# Patient Record
Sex: Female | Born: 1980 | Race: White | Hispanic: No | Marital: Single | Smoking: Never smoker
Health system: Southern US, Community
[De-identification: ages and names within clinical notes are randomized; demographics above are authoritative.]

## PROBLEM LIST (undated history)

## (undated) DIAGNOSIS — R011 Cardiac murmur, unspecified: Secondary | ICD-10-CM

## (undated) DIAGNOSIS — K8020 Calculus of gallbladder without cholecystitis without obstruction: Secondary | ICD-10-CM

## (undated) DIAGNOSIS — K812 Acute cholecystitis with chronic cholecystitis: Secondary | ICD-10-CM

## (undated) DIAGNOSIS — R51 Headache: Secondary | ICD-10-CM

## (undated) DIAGNOSIS — C801 Malignant (primary) neoplasm, unspecified: Secondary | ICD-10-CM

## (undated) DIAGNOSIS — F32A Depression, unspecified: Secondary | ICD-10-CM

## (undated) DIAGNOSIS — F329 Major depressive disorder, single episode, unspecified: Secondary | ICD-10-CM

## (undated) DIAGNOSIS — F419 Anxiety disorder, unspecified: Secondary | ICD-10-CM

## (undated) HISTORY — DX: Major depressive disorder, single episode, unspecified: F32.9

## (undated) HISTORY — PX: COLON SURGERY: SHX602

## (undated) HISTORY — PX: APPENDECTOMY: SHX54

## (undated) HISTORY — DX: Cardiac murmur, unspecified: R01.1

## (undated) HISTORY — DX: Anxiety disorder, unspecified: F41.9

## (undated) HISTORY — DX: Depression, unspecified: F32.A

---
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
## 2000-09-22 ENCOUNTER — Encounter: Payer: Self-pay | Admitting: Emergency Medicine

## 2000-09-22 ENCOUNTER — Emergency Department (HOSPITAL_COMMUNITY): Admission: EM | Admit: 2000-09-22 | Discharge: 2000-09-22 | Payer: Self-pay | Admitting: Emergency Medicine

## 2000-11-15 ENCOUNTER — Other Ambulatory Visit: Admission: RE | Admit: 2000-11-15 | Discharge: 2000-11-15 | Payer: Self-pay | Admitting: Obstetrics and Gynecology

## 2000-12-18 ENCOUNTER — Encounter: Payer: Self-pay | Admitting: Obstetrics and Gynecology

## 2000-12-18 ENCOUNTER — Ambulatory Visit (HOSPITAL_COMMUNITY): Admission: RE | Admit: 2000-12-18 | Discharge: 2000-12-18 | Payer: Self-pay | Admitting: Obstetrics and Gynecology

## 2001-01-24 ENCOUNTER — Inpatient Hospital Stay (HOSPITAL_COMMUNITY): Admission: AD | Admit: 2001-01-24 | Discharge: 2001-01-24 | Payer: Self-pay | Admitting: Obstetrics and Gynecology

## 2001-02-20 ENCOUNTER — Inpatient Hospital Stay (HOSPITAL_COMMUNITY): Admission: AD | Admit: 2001-02-20 | Discharge: 2001-02-20 | Payer: Self-pay | Admitting: Obstetrics and Gynecology

## 2001-02-24 ENCOUNTER — Inpatient Hospital Stay (HOSPITAL_COMMUNITY): Admission: AD | Admit: 2001-02-24 | Discharge: 2001-02-24 | Payer: Self-pay | Admitting: Obstetrics and Gynecology

## 2001-04-29 ENCOUNTER — Inpatient Hospital Stay (HOSPITAL_COMMUNITY): Admission: AD | Admit: 2001-04-29 | Discharge: 2001-04-29 | Payer: Self-pay | Admitting: Obstetrics and Gynecology

## 2001-05-03 ENCOUNTER — Inpatient Hospital Stay (HOSPITAL_COMMUNITY): Admission: AD | Admit: 2001-05-03 | Discharge: 2001-05-03 | Payer: Self-pay | Admitting: Obstetrics and Gynecology

## 2001-05-09 ENCOUNTER — Inpatient Hospital Stay (HOSPITAL_COMMUNITY): Admission: AD | Admit: 2001-05-09 | Discharge: 2001-05-11 | Payer: Self-pay | Admitting: Obstetrics and Gynecology

## 2004-02-29 ENCOUNTER — Emergency Department (HOSPITAL_COMMUNITY): Admission: EM | Admit: 2004-02-29 | Discharge: 2004-02-29 | Payer: Self-pay | Admitting: Emergency Medicine

## 2004-04-14 ENCOUNTER — Emergency Department (HOSPITAL_COMMUNITY): Admission: EM | Admit: 2004-04-14 | Discharge: 2004-04-14 | Payer: Self-pay | Admitting: Emergency Medicine

## 2004-10-02 ENCOUNTER — Emergency Department (HOSPITAL_COMMUNITY): Admission: EM | Admit: 2004-10-02 | Discharge: 2004-10-03 | Payer: Self-pay | Admitting: Emergency Medicine

## 2004-10-09 ENCOUNTER — Emergency Department (HOSPITAL_COMMUNITY): Admission: EM | Admit: 2004-10-09 | Discharge: 2004-10-09 | Payer: Self-pay | Admitting: Emergency Medicine

## 2004-10-13 ENCOUNTER — Encounter: Admission: RE | Admit: 2004-10-13 | Discharge: 2005-01-11 | Payer: Self-pay | Admitting: Orthopedic Surgery

## 2005-08-02 ENCOUNTER — Ambulatory Visit: Payer: Self-pay | Admitting: Family Medicine

## 2005-10-04 ENCOUNTER — Ambulatory Visit: Payer: Self-pay | Admitting: Family Medicine

## 2006-04-25 ENCOUNTER — Emergency Department (HOSPITAL_COMMUNITY): Admission: EM | Admit: 2006-04-25 | Discharge: 2006-04-25 | Payer: Self-pay | Admitting: Emergency Medicine

## 2006-06-06 ENCOUNTER — Ambulatory Visit (HOSPITAL_COMMUNITY): Admission: RE | Admit: 2006-06-06 | Discharge: 2006-06-06 | Payer: Self-pay | Admitting: Cardiovascular Disease

## 2007-01-20 DIAGNOSIS — J45909 Unspecified asthma, uncomplicated: Secondary | ICD-10-CM | POA: Insufficient documentation

## 2007-01-20 DIAGNOSIS — M545 Low back pain: Secondary | ICD-10-CM

## 2007-02-27 ENCOUNTER — Ambulatory Visit: Payer: Self-pay | Admitting: Family Medicine

## 2007-02-28 DIAGNOSIS — G43909 Migraine, unspecified, not intractable, without status migrainosus: Secondary | ICD-10-CM | POA: Insufficient documentation

## 2007-02-28 DIAGNOSIS — F39 Unspecified mood [affective] disorder: Secondary | ICD-10-CM | POA: Insufficient documentation

## 2007-03-06 ENCOUNTER — Telehealth (INDEPENDENT_AMBULATORY_CARE_PROVIDER_SITE_OTHER): Payer: Self-pay | Admitting: *Deleted

## 2007-08-08 ENCOUNTER — Ambulatory Visit: Payer: Self-pay | Admitting: Family Medicine

## 2007-08-08 ENCOUNTER — Other Ambulatory Visit: Admission: RE | Admit: 2007-08-08 | Discharge: 2007-08-08 | Payer: Self-pay | Admitting: Family Medicine

## 2007-08-08 ENCOUNTER — Encounter: Payer: Self-pay | Admitting: Family Medicine

## 2007-08-08 DIAGNOSIS — Z6841 Body Mass Index (BMI) 40.0 and over, adult: Secondary | ICD-10-CM

## 2007-08-08 DIAGNOSIS — R030 Elevated blood-pressure reading, without diagnosis of hypertension: Secondary | ICD-10-CM | POA: Insufficient documentation

## 2007-08-08 LAB — CONVERTED CEMR LAB
ALT: 15 units/L (ref 0–35)
AST: 16 units/L (ref 0–37)
Albumin: 3.3 g/dL — ABNORMAL LOW (ref 3.5–5.2)
Alkaline Phosphatase: 37 units/L — ABNORMAL LOW (ref 39–117)
BUN: 8 mg/dL (ref 6–23)
Basophils Absolute: 0 10*3/uL (ref 0.0–0.1)
Basophils Relative: 0.3 % (ref 0.0–1.0)
Bilirubin Urine: NEGATIVE
Bilirubin, Direct: 0.1 mg/dL (ref 0.0–0.3)
CO2: 30 meq/L (ref 19–32)
Calcium: 9.4 mg/dL (ref 8.4–10.5)
Chloride: 108 meq/L (ref 96–112)
Cholesterol: 193 mg/dL (ref 0–200)
Creatinine, Ser: 0.7 mg/dL (ref 0.4–1.2)
Eosinophils Absolute: 0 10*3/uL (ref 0.0–0.6)
Eosinophils Relative: 0.6 % (ref 0.0–5.0)
GFR calc Af Amer: 130 mL/min
GFR calc non Af Amer: 108 mL/min
Glucose, Bld: 83 mg/dL (ref 70–99)
Glucose, Urine, Semiquant: NEGATIVE
HCT: 36.7 % (ref 36.0–46.0)
HDL: 47.5 mg/dL (ref >39.0)
Hemoglobin: 12.3 g/dL (ref 12.0–15.0)
Hgb A1c MFr Bld: 5.5 % (ref 4.6–6.0)
LDL Cholesterol: 119 mg/dL — ABNORMAL HIGH (ref 0–99)
Lymphocytes Relative: 25.9 % (ref 12.0–46.0)
MCHC: 33.4 g/dL (ref 30.0–36.0)
MCV: 91.3 fL (ref 78.0–100.0)
Monocytes Absolute: 0.3 10*3/uL (ref 0.2–0.7)
Monocytes Relative: 4.2 % (ref 3.0–11.0)
Neutro Abs: 5.3 10*3/uL (ref 1.4–7.7)
Neutrophils Relative %: 69 % (ref 43.0–77.0)
Nitrite: NEGATIVE
Platelets: 234 10*3/uL (ref 150–400)
Potassium: 4.9 meq/L (ref 3.5–5.1)
RBC: 4.02 M/uL (ref 3.87–5.11)
RDW: 12.2 % (ref 11.5–14.6)
Sodium: 142 meq/L (ref 135–145)
Specific Gravity, Urine: >=1.03
TSH: 2.64 microintl units/mL (ref 0.35–5.50)
Total Bilirubin: 0.6 mg/dL (ref 0.3–1.2)
Total CHOL/HDL Ratio: 4.1
Total Protein: 6.2 g/dL (ref 6.0–8.3)
Triglycerides: 131 mg/dL (ref 0–149)
Urobilinogen, UA: 0.2
VLDL: 26 mg/dL (ref 0–40)
WBC: 7.6 10*3/uL (ref 4.5–10.5)
pH: 5.5

## 2010-04-20 ENCOUNTER — Ambulatory Visit: Payer: Self-pay | Admitting: Family Medicine

## 2010-04-20 DIAGNOSIS — J309 Allergic rhinitis, unspecified: Secondary | ICD-10-CM | POA: Insufficient documentation

## 2010-04-20 LAB — CONVERTED CEMR LAB
ALT: 16 units/L (ref 0–35)
AST: 16 units/L (ref 0–37)
Albumin: 3.9 g/dL (ref 3.5–5.2)
Alkaline Phosphatase: 47 units/L (ref 39–117)
BUN: 14 mg/dL (ref 6–23)
Basophils Absolute: 0 10*3/uL (ref 0.0–0.1)
Basophils Relative: 0.1 % (ref 0.0–3.0)
Bilirubin, Direct: 0.1 mg/dL (ref 0.0–0.3)
CO2: 28 meq/L (ref 19–32)
Calcium: 9 mg/dL (ref 8.4–10.5)
Chloride: 106 meq/L (ref 96–112)
Cholesterol: 172 mg/dL (ref 0–200)
Creatinine, Ser: 0.6 mg/dL (ref 0.4–1.2)
Eosinophils Absolute: 0.1 10*3/uL (ref 0.0–0.7)
Eosinophils Relative: 1 % (ref 0.0–5.0)
GFR calc non Af Amer: 127.81 mL/min (ref >60)
Glucose, Bld: 83 mg/dL (ref 70–99)
HCT: 37 % (ref 36.0–46.0)
HDL: 41.6 mg/dL (ref >39.00)
Hemoglobin: 12.7 g/dL (ref 12.0–15.0)
LDL Cholesterol: 105 mg/dL — ABNORMAL HIGH (ref 0–99)
Lymphocytes Relative: 15.7 % (ref 12.0–46.0)
Lymphs Abs: 1.5 10*3/uL (ref 0.7–4.0)
MCHC: 34.4 g/dL (ref 30.0–36.0)
MCV: 92.9 fL (ref 78.0–100.0)
Monocytes Absolute: 0.5 10*3/uL (ref 0.1–1.0)
Monocytes Relative: 4.8 % (ref 3.0–12.0)
Neutro Abs: 7.7 10*3/uL (ref 1.4–7.7)
Neutrophils Relative %: 78.4 % — ABNORMAL HIGH (ref 43.0–77.0)
Platelets: 209 10*3/uL (ref 150.0–400.0)
Potassium: 4.2 meq/L (ref 3.5–5.1)
RBC: 3.98 M/uL (ref 3.87–5.11)
RDW: 13.5 % (ref 11.5–14.6)
Sodium: 142 meq/L (ref 135–145)
TSH: 1 microintl units/mL (ref 0.35–5.50)
Total Bilirubin: 0.6 mg/dL (ref 0.3–1.2)
Total CHOL/HDL Ratio: 4
Total Protein: 6.8 g/dL (ref 6.0–8.3)
Triglycerides: 128 mg/dL (ref 0.0–149.0)
VLDL: 25.6 mg/dL (ref 0.0–40.0)
WBC: 9.8 10*3/uL (ref 4.5–10.5)

## 2010-05-22 ENCOUNTER — Other Ambulatory Visit
Admission: RE | Admit: 2010-05-22 | Discharge: 2010-05-22 | Payer: Self-pay | Source: Home / Self Care | Admitting: Family Medicine

## 2010-05-22 ENCOUNTER — Ambulatory Visit: Payer: Self-pay | Admitting: Family Medicine

## 2010-07-04 NOTE — Assessment & Plan Note (Signed)
Summary: sinuses//ok per rachel//ccm   Vital Signs:  Patient profile:   30 year old female Weight:      266 pounds Temp:     98.0 degrees F oral BP sitting:   110 / 80  (left arm) Cuff size:   regular  Vitals Entered By: Kern Reap CMA Duncan Dull) (April 20, 2010 3:07 PM) CC: head congestion   CC:  head congestion.  History of Present Illness: [name] [name] is a 30 year old female, who comes in today with a 3-D 8 history of head congestion, postnasal drip, and sneezing.  No viral symptoms.  No history of previous allergic problems in the past  Allergies: No Known Drug Allergies  Past History:  Past medical, surgical, family and social histories (including risk factors) reviewed for relevance to current acute and chronic problems.  Past Medical History: Reviewed history from 08/08/2007 and no changes required. Asthma Acne Heart Murmur Low back pain obese  Past Surgical History: Reviewed history from 01/20/2007 and no changes required. Denies surgical history  Family History: Reviewed history from 01/20/2007 and no changes required. Family History of ADHD Family History Breast cancer 1st degree relative <50 Family History Psychiatric care Family History Weight disorder Family History of Cardiovascular disorder Family History Hypertension Family History of [place]  Social History: Reviewed history from 08/08/2007 and no changes required. 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 No apparent alopecia or balding. Eyes:  No corneal or conjunctival inflammation noted. EOMI. Perrla. Funduscopic exam benign, without hemorrhages, exudates or papilledema. Vision grossly normal. Ears:  External ear  exam shows no significant lesions or deformities.  Otoscopic examination reveals clear canals, tympanic membranes are intact bilaterally without bulging, retraction, inflammation or discharge. Hearing is grossly normal bilaterally. Nose:  External nasal examination shows no deformity or inflammation. Nasal mucosa are pink and moist without lesions or exudates. Mouth:  Oral mucosa and oropharynx without lesions or exudates.  Teeth in good repair. Neck:  No deformities, masses, or tenderness noted. Chest Wall:  No deformities, masses, or tenderness noted. Lungs:  Normal respiratory effort, chest expands symmetrically. Lungs are clear to auscultation, no crackles or wheezes.   Problems:  Medical Problems Added: 1)  Dx of Routine General Medical Exam@[facility]  (ICD-V70.0) 2)  Dx of Rhinitis  (ICD-477.9)  Impression & Recommendations:  Problem # 1:  RHINITIS (ICD-477.9) Assessment New  Her updated medication list for this problem includes:    Flonase 50 Mcg/act Susp (Fluticasone propionate) ..... Uad  Complete Medication List: 1)  Celexa 20 Mg Tabs (Citalopram hydrobromide) .... Once daily 2)  Zomig Zmt 5 Mg Tbdp (Zolmitriptan) .... As needed ha 3)  Flonase 50 Mcg/act Susp (Fluticasone propionate) .... Uad  Other Orders: Venipuncture (48546) TLB-Lipid Panel (80061-LIPID) TLB-BMP (Basic Metabolic Panel-BMET) (80048-METABOL) TLB-CBC Platelet - w/Differential (85025-CBCD) TLB-Hepatic/Liver Function Pnl (80076-HEPATIC) TLB-TSH (Thyroid Stimulating Hormone) (84443-TSH) UA Dipstick w/o Micro (automated)  (81003) Specimen Handling (27035)  Patient Instructions: 1)  take plain Zyrtec 10 mg at bedtime, along with the nasal irrigation................Marland Kitchen Afrin.......... warm salt water........ Flonase nasal spray............ however remember.  There is a 5 night limit on the afrin  2)  Return sometime in the next month for general physical exam Prescriptions: ZOMIG ZMT 5 MG  TBDP  (ZOLMITRIPTAN) as needed ha  #6 x 0  Entered and Authorized by:   Roderick Pee MD   Signed by:   Roderick Pee MD on 04/20/2010   Method used:   Print then Give to Patient   RxID:   [id number] CELEXA 20 MG  TABS (CITALOPRAM HYDROBROMIDE) once daily  #100 Tablet x 0   Entered and Authorized by:   Roderick Pee MD   Signed by:   Roderick Pee MD on 04/20/2010   Method used:   Print then Give to Patient   RxID:   [id number] QIONGEX 50 MCG/ACT SUSP (FLUTICASONE PROPIONATE) UAD  #1 unit x 6   Entered and Authorized by:   Roderick Pee MD   Signed by:   Roderick Pee MD on 04/20/2010   Method used:   Print then Give to Patient   RxID:   [id number]    Orders Added: 1)  Venipuncture [36644] 2)  TLB-Lipid Panel [80061-LIPID] 3)  TLB-BMP (Basic Metabolic Panel-BMET) [80048-METABOL] 4)  TLB-CBC Platelet - w/Differential [85025-CBCD] 5)  TLB-Hepatic/Liver Function Pnl [80076-HEPATIC] 6)  TLB-TSH (Thyroid Stimulating Hormone) [84443-TSH] 7)  Est. Patient Level III [03474] 8)  UA Dipstick w/o Micro (automated)  [81003] 9)  Specimen Handling [99000]  Appended Document: sinuses//ok per rachel//ccm The is an Laboratory Results   Urine Tests    Routine Urinalysis   Color: yellow Appearance: Clear Glucose: negative   (Normal Range: Negative) Bilirubin: negative   (Normal Range: Negative) Ketone: trace (5)   (Normal Range: Negative) Spec. Gravity: >=1.030   (Normal Range: 1.003-1.035) Blood: 2+   (Normal Range: Negative) pH: 5.5   (Normal Range: 5.0-8.0) Protein: trace   (Normal Range: Negative) Urobilinogen: 1.0   (Normal Range: 0-1) Nitrite: positive   (Normal Range: Negative) Leukocyte Esterace: 1+   (Normal Range: Negative)    Comments: Rita Ohara  April 20, 2010 4:38 PM

## 2010-07-06 NOTE — Assessment & Plan Note (Signed)
Summary: cpx//pap//alp   Vital Signs:  Patient profile:   30 year old female Menstrual status:  regular LMP:     05/09/2010 Height:      65 inches Weight:      261 pounds BMI:     43.59 Temp:     98.3 degrees F oral BP sitting:   100 / 76  (left arm) Cuff size:   regular  Vitals Entered By: Kern Reap CMA Duncan Dull) (May 22, 2010 10:08 AM) CC: cpx Is Patient Diabetic? No Pain Assessment Patient in pain? no      LMP (date): 05/09/2010     Menstrual Status regular Enter LMP: 05/09/2010   CC:  cpx.  History of Present Illness: [name] [name] is a delightful 30 year old. female, G2, P2....... children are 77 and 63 years of age......... impending divorce........ husband refuses marriage counseling........ who comes in today for general medical examination.  She takes Celexa 20 mg nightly for depression, and Zomig p.r.n. for migraines.  She states she feels otherwise well has begun to an exercise program has lost 30 pounds in last 12 months.  She's down to 261 pounds.  Menses are normal.  Tetanus 2009 no birth control not sexually active  Allergies (verified): No Known Drug Allergies  Past History:  Past medical, surgical, family and social histories (including risk factors) reviewed, and no changes noted (except as noted below).  Past Medical History: Reviewed history from 08/08/2007 and no changes required. Asthma Acne Heart Murmur Low back pain obese  Past Surgical History: Reviewed history from 01/20/2007 and no changes required. Denies surgical history  Family History: Reviewed history from 01/20/2007 and no changes required. Family History of ADHD Family History Breast cancer 1st degree relative <50 Family History Psychiatric care Family History Weight disorder Family History of Cardiovascular disorder Family History Hypertension Family History of [place]  Social History: Reviewed history from 08/08/2007 and no changes  required. 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 No apparent alopecia or balding. Eyes:  No corneal or conjunctival inflammation noted. EOMI. Perrla. Funduscopic exam benign, without hemorrhages, exudates or papilledema. Vision grossly normal. Ears:  External ear exam shows no significant lesions or deformities.  Otoscopic examination reveals clear canals, tympanic membranes are intact bilaterally without bulging, retraction, inflammation or discharge. Hearing is grossly normal bilaterally. Nose:  External nasal examination shows no deformity or inflammation. Nasal mucosa are pink and moist without lesions or exudates. Mouth:  Oral mucosa and oropharynx without lesions or exudates.  Teeth in good repair. Neck:  No deformities, masses, or tenderness noted. Chest Wall:  No deformities, masses, or tenderness noted. Breasts:  No mass, nodules, thickening, tenderness, bulging, retraction, inflamation, nipple discharge or skin changes noted.   Lungs:  Normal respiratory effort, chest expands symmetrically. Lungs are clear to auscultation, no crackles or wheezes. Heart:  Normal rate and regular rhythm. S1 and S2 normal without gallop, murmur, click, rub or other extra sounds. Abdomen:  Bowel sounds positive,abdomen soft and non-tender without masses, organomegaly or hernias noted. Genitalia:  Pelvic Exam:        External: normal female genitalia without lesions or masses        Vagina: normal without lesions or masses        Cervix: normal without lesions or masses  Adnexa: normal bimanual exam without masses or fullness        Uterus: normal by palpation        Pap smear: performed Msk:  No deformity or scoliosis  noted of thoracic or lumbar spine.   Pulses:  R and L carotid,radial,femoral,dorsalis pedis and posterior tibial pulses are full and equal bilaterally Extremities:  No clubbing, cyanosis, edema, or deformity noted with normal full range of motion of all joints.   Neurologic:  No cranial nerve deficits noted. Station and gait are normal. Plantar reflexes are down-going bilaterally. DTRs are symmetrical throughout. Sensory, motor and coordinative functions appear intact. Skin:  Intact without suspicious lesions or rashes Cervical Nodes:  No lymphadenopathy noted Axillary Nodes:  No palpable lymphadenopathy Inguinal Nodes:  No significant adenopathy Psych:  Cognition and judgment appear intact. Alert and cooperative with normal attention span and concentration. No apparent delusions, illusions, hallucinations   Impression & Recommendations:  Problem # 1:  ROUTINE GENERAL MEDICAL EXAM@[facility] (ICD-V70.0) Assessment Unchanged  Problem # 2:  OVERWEIGHT (ICD-278.02) Assessment: Improved  Problem # 3:  MIGRAINE HEADACHE (ICD-346.90) Assessment: Improved  Her updated medication list for this problem includes:    Zomig Zmt 5 Mg Tbdp (Zolmitriptan) .Marland Kitchen... 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 Once daily 2)  Zomig Zmt 5 Mg Tbdp (Zolmitriptan) .... As needed ha 3)  Zovia 1/35e (28) 1-35 Mg-mcg Tabs (Ethynodiol diac-eth estradiol) .... Uad  Other Orders: Prescription Created Electronically [phone])  Patient Instructions: 1)  continue current medications. 2)  Call Victorino Dike and make an arrangement for you and your children to begin family counseling 3)  Please schedule a follow-up appointment in 1 year. 4)  Please schedule a follow-up appointment as needed. Prescriptions: ZOVIA 1/35E (28) 1-35 MG-MCG TABS (ETHYNODIOL DIAC-ETH ESTRADIOL) UAD  #3 x 3    Entered and Authorized by:   Roderick Pee MD   Signed by:   Roderick Pee MD on 05/22/2010   Method used:   Print then Give to Patient   RxID:   [id number] ZOMIG ZMT 5 MG  TBDP (ZOLMITRIPTAN) as needed ha  #6 x 0   Entered and Authorized by:   Roderick Pee MD   Signed by:   Roderick Pee MD on 05/22/2010   Method used:   Print then Give to Patient   RxID:   [id number] CELEXA 20 MG  TABS (CITALOPRAM HYDROBROMIDE) once daily  #100 Tablet x 0   Entered and Authorized by:   Roderick Pee MD   Signed by:   Roderick Pee MD on 05/22/2010   Method used:   Print then Give to Patient   RxID:   [id number]    Orders Added: 1)  Prescription Created Electronically [G8553] 2)  Est. Patient 18-39 years [25366]

## 2010-08-08 ENCOUNTER — Ambulatory Visit (INDEPENDENT_AMBULATORY_CARE_PROVIDER_SITE_OTHER): Payer: Self-pay | Admitting: Internal Medicine

## 2010-08-08 ENCOUNTER — Encounter: Payer: Self-pay | Admitting: Internal Medicine

## 2010-08-08 DIAGNOSIS — M545 Low back pain: Secondary | ICD-10-CM

## 2010-08-08 MED ORDER — DICLOFENAC SODIUM 75 MG PO TBEC: DELAYED_RELEASE_TABLET | ORAL | Status: AC

## 2010-08-08 MED ORDER — CYCLOBENZAPRINE HCL 10 MG PO TABS: 10.0000 mg | ORAL_TABLET | Freq: Three times a day (TID) | ORAL | Status: DC | PRN

## 2010-08-11 ENCOUNTER — Emergency Department (HOSPITAL_COMMUNITY)
Admission: EM | Admit: 2010-08-11 | Discharge: 2010-08-11 | Disposition: A | Discharge status: Home / Self Care | Payer: Self-pay | Attending: Emergency Medicine | Admitting: Emergency Medicine

## 2010-08-11 ENCOUNTER — Emergency Department (HOSPITAL_COMMUNITY): Payer: Self-pay

## 2010-08-11 DIAGNOSIS — Z1810 Retained metal fragments, unspecified: Secondary | ICD-10-CM | POA: Insufficient documentation

## 2010-08-11 DIAGNOSIS — M795 Residual foreign body in soft tissue: Secondary | ICD-10-CM | POA: Insufficient documentation

## 2010-08-11 DIAGNOSIS — M79609 Pain in unspecified limb: Secondary | ICD-10-CM | POA: Insufficient documentation

## 2010-08-14 ENCOUNTER — Encounter: Payer: Self-pay | Admitting: Internal Medicine

## 2010-08-14 NOTE — Progress Notes (Signed)
  Subjective:    Patient ID: Leafy Ro, female    DOB: 04/21/1981, 30 y.o.   MRN: [id number]  HPI Pt presents to clinic for evaluation of back pain. Notes right lbp with radiation down right leg x 1 wk. Denies injury or trigger. Worsens with change in position and improves with standing. Denies numbness or weakness. Attempted otc nsaids. No other complaints.  Reviewed pmh, medications and allergies.    Review of Systems  Musculoskeletal: Positive for back pain. Negative for joint swelling, arthralgias and gait problem.  Skin: Negative for color change, pallor and rash.       Objective:   Physical Exam  Nursing note and vitals reviewed. Constitutional: She appears well-developed and well-nourished. No distress.  HENT:  Head: Normocephalic and atraumatic.  Right Ear: External ear normal.  Left Ear: External ear normal.  Musculoskeletal:       No midline ls tenderness or bony abn. SLR neg right. LE strength 5/5 bilaterally. Gait nl  Neurological: She is alert.  Skin: Skin is warm and dry. She is not diaphoretic. No erythema.          Assessment & Plan:

## 2010-08-14 NOTE — Assessment & Plan Note (Signed)
Begin diclofenac with food and no other nsaids. Begin flexeril prn. Cautioned re: possible sedating effect. Followup if no improvement or worsening.

## 2010-10-20 NOTE — Discharge Summary (Signed)
[facility]  Patient:    [name] [name], [name] [name] Visit Number: [id number] MRN: [id number]          Service Type: OBS Location: 910A [id number] Attending Physician:  Michaele Offer Dictated by:   Zenaida Niece, M.D. Admit Date:  05/09/2001 Discharge Date: 05/11/2001                             Discharge Summary  ADMISSION DIAGNOSES: 1. Intrauterine pregnancy at 38 weeks. 2. Group B strep carrier. 3. Pulmonary stenosis.  DISCHARGE DIAGNOSES: 1. Intrauterine pregnancy at 38 weeks. 2. Group B strep carrier. 3. Pulmonary stenosis.  PROCEDURE:  Spontaneous vaginal delivery.  COMPLICATIONS:  None.  CONSULTATIONS:  None.  HISTORY OF PRESENT ILLNESS:  This is a 30 year old white female, gravida 2, para 1-0-0-1, with an estimated gestational age of [redacted] weeks by a 9 week ultrasound with a due date of May 21, 2001, who presents with the complain of regular contractions without bleeding or rupture of membranes, and with good fetal movement.  Vaginal examination in the office was 5 cm dilated, and she was sent to labor and delivery.  Prenatal care complicated only by anemia treated with iron.  PRENATAL LABORATORY DATA:  Blood type is A-, negative antibody screen.  RPR nonreactive.  Rubella immune.  Hepatitis B surface antigen negative.  HIV negative.  Gonorrhea and chlamydia negative.  Triple screen normal.  One hour glucola 131.  Group B strep is positive.  PAST OBSTETRICAL HISTORY:  In April 2001, vaginal delivery at 38 weeks, 8 pounds 4 ounces, no complications.  PAST MEDICAL HISTORY: 1. She has moderate pulmonary stenosis and gets SBE prophylaxis. 2. Mild asthma. 3. Occasional migraines. 4. History of a fractured left fifth digit.  MEDICATIONS: 1. Iron. 2. Proventil metered dose inhaler p.r.n.  SOCIAL HISTORY:  She is single, and father of the baby is here.  PHYSICAL EXAMINATION:  VITAL SIGNS:  She is afebrile with stable  vital signs.  Fetal heart tracing is reactive.  ABDOMEN:  Gravid, nontender, with an appropriate fundal height.  PELVIC:  My first vaginal examination on labor and delivery is 5, 50, and -1.  HOSPITAL COURSE:  The patient was admitted in labor and continue to contract on her own.  She had spontaneous rupture of membranes and received an epidural.  She was put on penicillin for Group B strep prophylaxis.  It was also planned to give her gentamicin around delivery for SBE prophylaxis. However, she progressed rapidly to complete, and pushed well.  She had a vaginal delivery of a viable female infant with Apgars of 9 and 9 that weighed 6 pounds 12 ounces over an intact perineum.  Placenta delivered spontaneous and was intact.  She had a first degree vaginal and right labial lacerations repaired with 3-0 Vicryl.  Estimated blood loss was less than 500 cc.  Due to the fact that she had progressed too quickly to get gentamicin, she was given one dose of 2 g of amoxicillin.  Postpartum, she did very well.  Remained afebrile, and bottle fed her baby without complications.  Pre-delivery hemoglobin 10.2, post-delivery 9.9.  On the morning of postpartum day #2, she was felt to be stable enough for discharge home.  CONDITION ON DISCHARGE:  Stable.  DISPOSITION:  Discharged to home.  DIET:  Regular.  ACTIVITY:  Pelvic rest.  FOLLOWUP:  In 4 to 6 weeks.  DISCHARGE MEDICATIONS:  Over-the-counter Motrin  or Aleve p.r.n.  She is given our discharge pamphlet. Dictated by:   Zenaida Niece, M.D. Attending Physician:  Michaele Offer DD:  05/11/01 TD:  05/11/01 Job: [id number]

## 2010-10-20 NOTE — Discharge Summary (Signed)
[facility]  Patient:    [name] [name], [name] [name]                 MRN: [id number] Adm. Date:  [id number] Disc. Date: [id number] Attending:  Michaele Offer                           Discharge Summary  HISTORY OF PRESENT ILLNESS:   [age]-year-old white female, para 0, gravida 1, estimated gestational age 30+ weeks by last period, compatible with a 17-week ultrasound with [facility] September 25, 1999, presented complaining of regular contractions, no uterine bleeding or rupture of membranes.  Good fetal movement.  Prenatal course complicated by Rh negative.  She did receive RhoGAM.  Positive group B strep. Blood group and type A negative with a negative antibody.  RPR nonreactive. Rubella immune.  Hepatitis B surface antigen negative.  GC and chlamydia negative. Pap smear normal.  One-hour Glucola 151, three-hour GTT 90, 93, 91, and 101. Group B strep positive.  PAST MEDICAL HISTORY:         1. History of heart murmur, asymptomatic.  The                                  patient has a card from 1993, stating she has                                  trivial valvular pulmonic stenosis and needs SBE                                  prophylaxis.                               2. Asthma.  Rare use of Proventil metered dose                                  inhaler.                               3. Left fifth finger fracture, age 30.  PAST SURGICAL HISTORY:        Negative.  ALLERGIES:                    No known drug allergies.  MEDICATIONS:                  Proventil MDI p.r.n.  SOCIAL HISTORY:               Single.  Father of the baby with the patient.  No  tobacco.  PHYSICAL EXAMINATION:         On admission, the patient had normal vital signs.  Fetal heart tones were reactive without decelerations.  Contractions were every  five to seven minutes.  Abdomen was gravid, nontender.  Fundal height 42 cm. Estimated fetal weight about 7-1/2 pounds.   Cervix was 3 cm, 50% effaced, and ballotable; subsequently became 4 and 60, and then 4 and 70 and -1.  Artificial  rupture of membranes produced a large amount of clear  fluid.  ADMISSION IMPRESSION:         1. Intrauterine pregnancy at 38 weeks, in early                                  labor.                               2. Patient Rh negative, received RhoGAM.                               3. Asymptomatic trivial pulmonic stenosis.                               4. Positive group B strep.  HOSPITAL COURSE:              The patient was prepared for ampicillin and gentamicin around the time of delivery.  She progressed to full dilation and, after 25 minutes of pushing, delivered a living female infant, 8 pounds, 4 ounces, Apgars of 8 at one and 9 at five minutes, over a midline episiotomy under local and epidural by Dr. Ambrose Mantle.  Placenta intact, uterus normal.  Rectal negative. Midline episiotomy repaired with 3-0 Dexon.  Blood loss 500 cc.  Postpartum, the patient did quite well and was discharged on the second postpartum day.  The patient did receive RhoGAM.  Hemoglobin on admission was 11.6, hematocrit 32.8, white count  10,100, platelet count 214,000.  Follow-up hematocrit was 28.8, hemoglobin 10.0, white count 10,700, platelet count 185,000.  RPR was nonreactive.  FINAL DIAGNOSES:              1. Intrauterine pregnancy 38 weeks, delivered vertex.                               2. Group B strep carrier.                               3. Heart murmur.                               4. Patient Rh negative.  OPERATIONS:                   Spontaneous delivery, vertex, midline episiotomy nd repair.  CONDITION ON DISCHARGE:       Improved.  DISCHARGE INSTRUCTIONS:       Include our regular discharge instruction booklet.  MEDICATIONS:                  She declines analgesics.  FOLLOW-UP:                    She is advised to return in six weeks for follow-up examination. DD:   09/20/99 TD:  09/20/99 Job: [id number]

## 2010-11-28 ENCOUNTER — Encounter: Payer: Self-pay | Admitting: Family Medicine

## 2010-11-28 ENCOUNTER — Ambulatory Visit (INDEPENDENT_AMBULATORY_CARE_PROVIDER_SITE_OTHER): Payer: 59 | Admitting: Family Medicine

## 2010-11-28 VITALS — BP 110/80 | Temp 98.7°F | Wt 243.0 lb

## 2010-11-28 DIAGNOSIS — M545 Low back pain, unspecified: Secondary | ICD-10-CM

## 2010-11-28 MED ORDER — CYCLOBENZAPRINE HCL 10 MG PO TABS: ORAL_TABLET | ORAL | Status: DC

## 2010-11-28 MED ORDER — HYDROCODONE-ACETAMINOPHEN 7.5-750 MG PO TABS: ORAL_TABLET | ORAL | Status: DC

## 2010-11-28 NOTE — Patient Instructions (Signed)
Stay at complete bed rest today, Wednesday, and Thursday, and Friday, walk and then lie down and do this Friday, Saturday, Sunday, and see [place] on Monday for follow-up.  Flexeril and Vicodin, one half of each 3 to 4 times daily as needed for severe pain.  Begin a caloric restricted diet.  Decrease her caloric intake to 1500 calories daily and drink 32 ounces of water daily.  We will also have you see the physical therapist ASAP

## 2010-11-28 NOTE — Progress Notes (Signed)
  Subjective:    Patient ID: [name] [name], female    DOB: 10/09/1980, 30 y.o.   MRN: [id number]  HPIJennifer is a delightful 30 year old, married female, nonsmoker,,,,,,,,, works at [employer],,,,,, who comes in today for evaluation of low back pain.  She states that in mid April she developed the gradual onset of right lower back pain and hip pain.  She was seen here in the office by Dr. Irving Burton and at that time.  Neurologic exam was normal.  She was felt to have a pinched nerve in her back.  It was treated with anti-inflammatories, Flexeril, and pain went away felt fine until about 3 weeks ago, when she was lifting at work and developed the sudden onset of severe sharp pain in her right hip that radiates down to the [place] portion of her right leg.  She describes the pain as sharp, constant, shooting down her right leg.  She is an occasional episode of numbness or entire leg.  Will go numb.  No bowel or bladder dysfunction.  Neurologic review of systems otherwise negative.  No history of trauma    Review of Systems General neurologic review of systems negative    Objective:   Physical Exam    Well-developed well-nourished, female, obese, weight 243, examination.  The abdomen is negative.  Lower extremities both legs are of equal length.  Sensation muscle strength.  Reflexes all within normal limits.  Positive straight leg raising right leg 30 degrees    Assessment & Plan:  Lumbar disk disease with radiation down right leg.  Plan bed rest medications follow-up Monday.  Physical therapy diet, exercise, weight loss

## 2010-12-05 ENCOUNTER — Ambulatory Visit (INDEPENDENT_AMBULATORY_CARE_PROVIDER_SITE_OTHER): Payer: 59 | Admitting: Family Medicine

## 2010-12-05 ENCOUNTER — Ambulatory Visit (INDEPENDENT_AMBULATORY_CARE_PROVIDER_SITE_OTHER)
Admission: RE | Admit: 2010-12-05 | Discharge: 2010-12-05 | Disposition: A | Discharge status: Home / Self Care | Payer: 59 | Source: Ambulatory Visit | Attending: Family Medicine | Admitting: Family Medicine

## 2010-12-05 ENCOUNTER — Encounter: Payer: Self-pay | Admitting: Family Medicine

## 2010-12-05 VITALS — BP 110/80 | Temp 99.0°F | Wt 243.0 lb

## 2010-12-05 DIAGNOSIS — M545 Low back pain, unspecified: Secondary | ICD-10-CM
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
## 2010-12-05 NOTE — Patient Instructions (Signed)
Go to the main office now for x-rays of your spine.  I will try to get you set up for the the fifth of the sixth of this month to begin physical therapy.  See me a week after the physical therapy for follow-up.  Stop the Flexeril.  Pain medication one half to one tab 3 to 4 times daily as needed for pain

## 2010-12-07 NOTE — Progress Notes (Signed)
Left message on machine for patient

## 2012-03-17 ENCOUNTER — Telehealth: Payer: Self-pay | Admitting: Family Medicine

## 2012-03-17 NOTE — Telephone Encounter (Signed)
Come in tomorrow Tuesday

## 2012-03-17 NOTE — Telephone Encounter (Signed)
Lab or office visit?

## 2012-03-17 NOTE — Telephone Encounter (Signed)
Appointment made

## 2012-03-17 NOTE — Telephone Encounter (Signed)
Pt is req to come in to get pregnancy test done. Pt said that she took home pregnancy test and results were positive.

## 2012-03-18 ENCOUNTER — Ambulatory Visit (INDEPENDENT_AMBULATORY_CARE_PROVIDER_SITE_OTHER): Payer: 59 | Admitting: Family Medicine

## 2012-03-18 ENCOUNTER — Encounter: Payer: Self-pay | Admitting: Family Medicine

## 2012-03-18 VITALS — BP 110/78 | Temp 98.4°F | Wt 270.0 lb

## 2012-03-18 DIAGNOSIS — N926 Irregular menstruation, unspecified: Secondary | ICD-10-CM

## 2012-03-18 DIAGNOSIS — N912 Amenorrhea, unspecified: Secondary | ICD-10-CM

## 2012-03-18 DIAGNOSIS — Z331 Pregnant state, incidental: Secondary | ICD-10-CM

## 2012-03-18 MED ORDER — HYDROCODONE-ACETAMINOPHEN 7.5-750 MG PO TABS: 1.0000 | ORAL_TABLET | Freq: Three times a day (TID) | ORAL | Status: DC | PRN

## 2012-03-18 NOTE — Progress Notes (Signed)
  Subjective:    Patient ID: [name] [name], female    DOB: [birth year], 31 y.o.   MRN: [id number]  HPI [name] [name] is a 31 year old married female nonsmoker G [id number],,,,,,,, 31 year old and 31 year old at home,,,, who comes in today feeling that she might be pregnant  Her LMP was August 21 through August 26 normal. She skipped this month. She checked a home pregnancy test and it was positive. Pregnancy test here also positive. She has some morning sickness and she's having occasional flare up in her migraine headaches otherwise she feels well. She has started taking her prenatal vitamins with folic acid  She has insurance through [place] [employer] because she works at [employer]. Her husband works at a bowling [employer] and he and the 2 children are not insured.   Review of Systems    general review of systems otherwise negative Objective:   Physical Exam Well-developed well-nourished female no acute distress cardiopulmonary exam normal abdominal exam normal  Pregnancy test urine positive by dates approximately 4 weeks       Assessment & Plan:  Pregnant x4 weeks plan continue the prenatal vitamins with folic acid refer to OB for followup  Migraine headaches,,,,,,,,, Vicodin 1/2-1 tablet when necessary for breakthrough migraines because she cannot take her normal antimigraine medication

## 2012-03-18 NOTE — Patient Instructions (Signed)
Take the prenatal matte vitamins with folic acid  Call and get set up for an OB appointment ASAP  Vicodin ES,,,,,,,, 1/2-1 tablet every 4-6 hours as needed for breakthrough migraine  Return when necessary

## 2012-04-10 ENCOUNTER — Telehealth: Payer: Self-pay | Admitting: *Deleted

## 2012-04-10 NOTE — Telephone Encounter (Signed)
Patient is calling because an Ultrasound is now required for her pregnancy.  She would like to know if we can order one?

## 2012-04-14 NOTE — Telephone Encounter (Signed)
Fleet Contras please call ultrasounds are done in the [facility] office??????????

## 2012-04-16 NOTE — Telephone Encounter (Signed)
Tried to call patient but voice mail has not been set up yet. 

## 2012-06-10 ENCOUNTER — Other Ambulatory Visit: Payer: Self-pay

## 2012-06-27 ENCOUNTER — Encounter: Payer: Self-pay | Admitting: Family Medicine

## 2012-07-02 ENCOUNTER — Other Ambulatory Visit (HOSPITAL_COMMUNITY): Payer: Self-pay | Admitting: Obstetrics and Gynecology

## 2012-07-02 DIAGNOSIS — Z0489 Encounter for examination and observation for other specified reasons: Secondary | ICD-10-CM

## 2012-07-03 ENCOUNTER — Ambulatory Visit (HOSPITAL_COMMUNITY)
Admission: RE | Admit: 2012-07-03 | Discharge: 2012-07-03 | Disposition: A | Discharge status: Home / Self Care | Payer: 59 | Source: Ambulatory Visit | Attending: Obstetrics and Gynecology | Admitting: Obstetrics and Gynecology

## 2012-07-03 ENCOUNTER — Encounter (HOSPITAL_COMMUNITY): Payer: Self-pay

## 2012-07-03 DIAGNOSIS — Z363 Encounter for antenatal screening for malformations: Secondary | ICD-10-CM | POA: Insufficient documentation

## 2012-07-03 DIAGNOSIS — Z0489 Encounter for examination and observation for other specified reasons: Secondary | ICD-10-CM

## 2012-07-03 DIAGNOSIS — Z1389 Encounter for screening for other disorder: Secondary | ICD-10-CM | POA: Insufficient documentation

## 2012-07-03 DIAGNOSIS — O358XX0 Maternal care for other (suspected) fetal abnormality and damage, not applicable or unspecified: Secondary | ICD-10-CM | POA: Insufficient documentation

## 2012-07-03 NOTE — Progress Notes (Signed)
Lise Auer Brislin  was seen today for an ultrasound appointment.  See full report in [facility].  Impression: Single IUP at 21 3/7 weeks Normal detailed fetal anatomy No markers associated with aneuploidy noted Normal amniotic fluid volume  Recommendations: Follow-up ultrasounds as clinically indicated.   Alpha Gula, MD

## 2012-07-10 ENCOUNTER — Other Ambulatory Visit (HOSPITAL_COMMUNITY): Payer: 59

## 2012-07-29 ENCOUNTER — Encounter (HOSPITAL_COMMUNITY): Payer: Self-pay | Admitting: *Deleted

## 2012-07-29 DIAGNOSIS — J45909 Unspecified asthma, uncomplicated: Secondary | ICD-10-CM | POA: Insufficient documentation

## 2012-07-29 DIAGNOSIS — K8020 Calculus of gallbladder without cholecystitis without obstruction: Secondary | ICD-10-CM | POA: Insufficient documentation

## 2012-07-29 DIAGNOSIS — R1013 Epigastric pain: Secondary | ICD-10-CM | POA: Insufficient documentation

## 2012-07-29 DIAGNOSIS — Z79899 Other long term (current) drug therapy: Secondary | ICD-10-CM | POA: Insufficient documentation

## 2012-07-29 DIAGNOSIS — O9989 Other specified diseases and conditions complicating pregnancy, childbirth and the puerperium: Secondary | ICD-10-CM | POA: Insufficient documentation

## 2012-07-29 DIAGNOSIS — O219 Vomiting of pregnancy, unspecified: Secondary | ICD-10-CM | POA: Insufficient documentation

## 2012-07-29 DIAGNOSIS — R011 Cardiac murmur, unspecified: Secondary | ICD-10-CM | POA: Insufficient documentation

## 2012-07-29 LAB — COMPREHENSIVE METABOLIC PANEL
ALT: 10 U/L (ref 0–35)
Albumin: 2.8 g/dL — ABNORMAL LOW (ref 3.5–5.2)
Alkaline Phosphatase: 65 U/L (ref 39–117)
Glucose, Bld: 99 mg/dL (ref 70–99)
Potassium: 4 mEq/L (ref 3.5–5.1)
Sodium: 139 mEq/L (ref 135–145)
Total Protein: 7.1 g/dL (ref 6.0–8.3)

## 2012-07-29 LAB — PREGNANCY, URINE: Preg Test, Ur: POSITIVE — AB

## 2012-07-29 LAB — URINE MICROSCOPIC-ADD ON

## 2012-07-29 LAB — URINALYSIS, ROUTINE W REFLEX MICROSCOPIC
Glucose, UA: NEGATIVE mg/dL
Protein, ur: 30 mg/dL — AB
Specific Gravity, Urine: 1.031 — ABNORMAL HIGH (ref 1.005–1.030)
pH: 5.5 (ref 5.0–8.0)

## 2012-07-29 LAB — CBC WITH DIFFERENTIAL/PLATELET
Basophils Relative: 0 % (ref 0–1)
Hemoglobin: 12.1 g/dL (ref 12.0–15.0)
MCHC: 35.2 g/dL (ref 30.0–36.0)
Monocytes Relative: 5 % (ref 3–12)
Neutro Abs: 6.5 10*3/uL (ref 1.7–7.7)
Neutrophils Relative %: 72 % (ref 43–77)
RBC: 3.86 MIL/uL — ABNORMAL LOW (ref 3.87–5.11)
WBC: 9 10*3/uL (ref 4.0–10.5)

## 2012-07-29 NOTE — ED Notes (Signed)
The pt is c/o epigastric pain since 1700 she felt bad for 2-3 days.  Nausea  And vomiting.   She is 5 months preg lmp aug.. edc June 9th

## 2012-07-30 ENCOUNTER — Emergency Department (HOSPITAL_COMMUNITY)
Admission: EM | Admit: 2012-07-30 | Discharge: 2012-07-30 | Disposition: A | Discharge status: Home / Self Care | Payer: Medicaid Other | Attending: Emergency Medicine | Admitting: Emergency Medicine

## 2012-07-30 ENCOUNTER — Emergency Department (HOSPITAL_COMMUNITY): Payer: Medicaid Other

## 2012-07-30 DIAGNOSIS — K8020 Calculus of gallbladder without cholecystitis without obstruction: Secondary | ICD-10-CM

## 2012-07-30 MED ORDER — ONDANSETRON 8 MG PO TBDP: 8.0000 mg | ORAL_TABLET | Freq: Three times a day (TID) | ORAL | Status: DC | PRN

## 2012-07-30 MED ORDER — SODIUM CHLORIDE 0.9 % IV BOLUS (SEPSIS)
1000.0000 mL | Freq: Once | INTRAVENOUS | Status: AC
  Administered 2012-07-30: 1000 mL via INTRAVENOUS

## 2012-07-30 MED ORDER — MORPHINE SULFATE 4 MG/ML IJ SOLN
6.0000 mg | Freq: Once | INTRAMUSCULAR | Status: AC
  Administered 2012-07-30: 6 mg via INTRAVENOUS
  Filled 2012-07-30: qty 2

## 2012-07-30 MED ORDER — DEXTROSE 5 % IV SOLN
1.0000 g | Freq: Once | INTRAVENOUS | Status: AC
  Administered 2012-07-30: 1 g via INTRAVENOUS
  Filled 2012-07-30: qty 10

## 2012-07-30 MED ORDER — OXYCODONE-ACETAMINOPHEN 5-325 MG PO TABS: 1.0000 | ORAL_TABLET | ORAL | Status: DC | PRN

## 2012-07-30 MED ORDER — ONDANSETRON HCL 4 MG/2ML IJ SOLN
4.0000 mg | Freq: Once | INTRAMUSCULAR | Status: AC
  Administered 2012-07-30: 4 mg via INTRAVENOUS
  Filled 2012-07-30: qty 2

## 2012-07-30 MED ORDER — MORPHINE SULFATE 4 MG/ML IJ SOLN
6.0000 mg | Freq: Once | INTRAMUSCULAR | Status: AC
  Administered 2012-07-30: 4 mg via INTRAVENOUS
  Filled 2012-07-30 (×2): qty 1

## 2012-07-30 NOTE — ED Provider Notes (Signed)
History     CSN: [id number]  Arrival date & time 07/29/12  2215   First MD Initiated Contact with Patient 07/30/12 0048      Chief Complaint  Patient presents with  . Abdominal Pain     The history is provided by the patient.   patient presents with acute onset upper abdominal pain.  She reports nausea and vomiting.  Her upper abdominal pain is severe and colicky in nature.  She's never had this before.  No history of gallstones.  She continues to feel the baby move.  No other complaints.  Nothing improves her pain.  Nothing worsens her pain.  She denies fevers and chills.  She is 25 2/[redacted] weeks pregnant today.   Past Medical History  Diagnosis Date  . Asthma   . Heart murmur     History reviewed. No pertinent past surgical history.  Family History  Problem Relation Age of Onset  . ADD / ADHD      family hx  . Breast cancer      fhx  . Mental illness      fhx  . Heart disease      fhx  . Hypertension      fhx    History  Substance Use Topics  . Smoking status: Never Smoker   . Smokeless tobacco: Not on file  . Alcohol Use: No    OB History   Grav Para Term Preterm Abortions TAB SAB Ect Mult Living   1               Review of Systems  Gastrointestinal: Positive for abdominal pain.  All other systems reviewed and are negative.    Allergies  Review of patient's allergies indicates no known allergies.  Home Medications   Current Outpatient Rx  Name  Route  Sig  Dispense  Refill  . HYDROcodone-acetaminophen (VICODIN ES) 7.5-750 MG per tablet   Oral   Take 1 tablet by mouth every 8 (eight) hours as needed for pain.   30 tablet   2   . ranitidine (ZANTAC) 150 MG tablet   Oral   Take 150 mg by mouth daily as needed for heartburn.         . ondansetron (ZOFRAN ODT) 8 MG disintegrating tablet   Oral   Take 1 tablet (8 mg total) by mouth every 8 (eight) hours as needed for nausea.   15 tablet   0   . oxyCODONE-acetaminophen (PERCOCET/ROXICET)  5-325 MG per tablet   Oral   Take 1 tablet by mouth every 4 (four) hours as needed for pain.   20 tablet   0     BP 95/81  Pulse 85  Temp(Src) 98.5 F (36.9 C) (Oral)  Resp 18  SpO2 97%  LMP 02/14/2012  Physical Exam  Nursing note and vitals reviewed. Constitutional: She is oriented to person, place, and time. She appears well-developed and well-nourished. No distress.  HENT:  Head: Normocephalic and atraumatic.  Eyes: EOM are normal.  Neck: Normal range of motion.  Cardiovascular: Normal rate, regular rhythm and normal heart sounds.   Pulmonary/Chest: Effort normal and breath sounds normal.  Abdominal: Soft. She exhibits no distension.  Mild epigastric tenderness.  Gravid fundus.  Obese  Musculoskeletal: Normal range of motion.  Neurological: She is alert and oriented to person, place, and time.  Skin: Skin is warm and dry.  Psychiatric: She has a normal mood and affect. Judgment normal.    ED  Course  Procedures (including critical care time)  Labs Reviewed  URINALYSIS, ROUTINE W REFLEX MICROSCOPIC - Abnormal; Notable for the following:    Color, Urine AMBER (*)    APPearance TURBID (*)    Specific Gravity, Urine 1.031 (*)    Hgb urine dipstick SMALL (*)    Bilirubin Urine SMALL (*)    Ketones, ur 15 (*)    Protein, ur 30 (*)    Leukocytes, UA MODERATE (*)    All other components within normal limits  PREGNANCY, URINE - Abnormal; Notable for the following:    Preg Test, Ur POSITIVE (*)    All other components within normal limits  CBC WITH DIFFERENTIAL - Abnormal; Notable for the following:    RBC 3.86 (*)    HCT 34.4 (*)    All other components within normal limits  COMPREHENSIVE METABOLIC PANEL - Abnormal; Notable for the following:    BUN 5 (*)    Albumin 2.8 (*)    All other components within normal limits  URINE MICROSCOPIC-ADD ON - Abnormal; Notable for the following:    Squamous Epithelial / LPF FEW (*)    Bacteria, UA MANY (*)    Casts GRANULAR  CAST (*)    Crystals CA OXALATE CRYSTALS (*)    All other components within normal limits  URINE CULTURE  LIPASE, BLOOD   US Abdomen Complete  07/30/2012  *RADIOLOGY REPORT*  Clinical Data:  Left upper quadrant abdominal pain for 12 hours. 24 weeks pregnancy.  COMPLETE ABDOMINAL ULTRASOUND  Comparison:  None.  Findings:  Gallbladder:  Cholelithiasis with multiple small stones layering in the dependent portion of the gallbladder.  Mild gallbladder sludge. Focal filling defect on the nondependent wall of the gallbladder measuring 5 mm suggesting a small polyp.  No gallbladder wall thickening or pericholecystic edema.  Murphy's sign is negative.  Common bile duct:  Limited visualization due to overlying bowel gas.  Visualized segments are normal in caliber, measuring 6 mm diameter.  Liver:  Limited visualization due to rib shadowing.  Visualized parenchymal pattern is normal.  IVC:  Appears normal.  Pancreas:  Pancreas is not well seen due to overlying bowel gas.  Spleen:  Spleen length measures 9.1 cm.  Normal parenchymal echotexture.  Right Kidney:  Right kidney measures 11.3 cm length.  No hydronephrosis.  Left Kidney:  Left kidney measures 11.6 cm length.  No hydronephrosis.  Abdominal aorta:  No aneurysm identified.  IMPRESSION: Small stones and sludge in the gallbladder.  Small gallbladder polyp.  No signs of inflammation.   Original Report Authenticated By: Burman Nieves, M.D.    I personally reviewed the imaging tests through PACS system I reviewed available ER/hospitalization records through the EMR   1. Cholelithiasis       MDM  4:24 AM The patient feels much better at this time.  Symptomatic cholelithiasis.  She is pregnant.  General surgery followup.  Likely cholecystectomy will be required after delivery of her child.  Symptomatic treatment at home.  Dietary recommendations made.        Lyanne Co, MD 07/30/12 0430

## 2012-07-31 LAB — URINE CULTURE

## 2012-08-10 ENCOUNTER — Emergency Department (HOSPITAL_COMMUNITY)
Admission: EM | Admit: 2012-08-10 | Discharge: 2012-08-10 | Disposition: A | Discharge status: Home / Self Care | Payer: Medicaid Other | Attending: Emergency Medicine | Admitting: Emergency Medicine

## 2012-08-10 ENCOUNTER — Encounter (HOSPITAL_COMMUNITY): Payer: Self-pay | Admitting: *Deleted

## 2012-08-10 DIAGNOSIS — O9989 Other specified diseases and conditions complicating pregnancy, childbirth and the puerperium: Secondary | ICD-10-CM | POA: Insufficient documentation

## 2012-08-10 DIAGNOSIS — J45909 Unspecified asthma, uncomplicated: Secondary | ICD-10-CM | POA: Insufficient documentation

## 2012-08-10 DIAGNOSIS — R109 Unspecified abdominal pain: Secondary | ICD-10-CM | POA: Insufficient documentation

## 2012-08-10 DIAGNOSIS — R011 Cardiac murmur, unspecified: Secondary | ICD-10-CM | POA: Insufficient documentation

## 2012-08-10 DIAGNOSIS — K8050 Calculus of bile duct without cholangitis or cholecystitis without obstruction: Secondary | ICD-10-CM

## 2012-08-10 DIAGNOSIS — K8020 Calculus of gallbladder without cholecystitis without obstruction: Secondary | ICD-10-CM | POA: Insufficient documentation

## 2012-08-10 DIAGNOSIS — R112 Nausea with vomiting, unspecified: Secondary | ICD-10-CM | POA: Insufficient documentation

## 2012-08-10 HISTORY — DX: Calculus of gallbladder without cholecystitis without obstruction: K80.20

## 2012-08-10 LAB — CBC WITH DIFFERENTIAL/PLATELET
Basophils Absolute: 0 10*3/uL (ref 0.0–0.1)
Basophils Relative: 0 % (ref 0–1)
Eosinophils Absolute: 0.1 K/uL (ref 0.0–0.7)
Eosinophils Relative: 1 % (ref 0–5)
HCT: 32.2 % — ABNORMAL LOW (ref 36.0–46.0)
Hemoglobin: 11.3 g/dL — ABNORMAL LOW (ref 12.0–15.0)
Lymphocytes Relative: 29 % (ref 12–46)
Lymphs Abs: 2.3 K/uL (ref 0.7–4.0)
MCH: 31.5 pg (ref 26.0–34.0)
MCHC: 35.1 g/dL (ref 30.0–36.0)
MCV: 89.7 fL (ref 78.0–100.0)
Monocytes Absolute: 0.4 10*3/uL (ref 0.1–1.0)
Monocytes Relative: 5 % (ref 3–12)
Neutro Abs: 5.2 K/uL (ref 1.7–7.7)
Neutrophils Relative %: 65 % (ref 43–77)
Platelets: 213 K/uL (ref 150–400)
RBC: 3.59 MIL/uL — ABNORMAL LOW (ref 3.87–5.11)
RDW: 13.6 % (ref 11.5–15.5)
WBC: 8.1 K/uL (ref 4.0–10.5)

## 2012-08-10 LAB — COMPREHENSIVE METABOLIC PANEL
AST: 17 U/L (ref 0–37)
Albumin: 2.7 g/dL — ABNORMAL LOW (ref 3.5–5.2)
BUN: 6 mg/dL (ref 6–23)
CO2: 21 mEq/L (ref 19–32)
Calcium: 9 mg/dL (ref 8.4–10.5)
Creatinine, Ser: 0.46 mg/dL — ABNORMAL LOW (ref 0.50–1.10)
GFR calc non Af Amer: >90 mL/min (ref >90)

## 2012-08-10 LAB — COMPREHENSIVE METABOLIC PANEL WITH GFR
ALT: 11 U/L (ref 0–35)
Alkaline Phosphatase: 69 U/L (ref 39–117)
Chloride: 106 meq/L (ref 96–112)
GFR calc Af Amer: >90 mL/min (ref >90)
Glucose, Bld: 104 mg/dL — ABNORMAL HIGH (ref 70–99)
Potassium: 3.9 meq/L (ref 3.5–5.1)
Sodium: 140 meq/L (ref 135–145)
Total Bilirubin: 0.3 mg/dL (ref 0.3–1.2)
Total Protein: 6.6 g/dL (ref 6.0–8.3)

## 2012-08-10 MED ORDER — ONDANSETRON HCL 4 MG/2ML IJ SOLN
4.0000 mg | Freq: Once | INTRAMUSCULAR | Status: AC
  Administered 2012-08-10: 4 mg via INTRAVENOUS
  Filled 2012-08-10: qty 2

## 2012-08-10 MED ORDER — ONDANSETRON 8 MG PO TBDP: 8.0000 mg | ORAL_TABLET | Freq: Two times a day (BID) | ORAL | Status: DC | PRN

## 2012-08-10 MED ORDER — HYDROMORPHONE HCL PF 1 MG/ML IJ SOLN
0.5000 mg | Freq: Once | INTRAMUSCULAR | Status: AC
  Administered 2012-08-10: 0.5 mg via INTRAVENOUS
  Filled 2012-08-10: qty 1

## 2012-08-10 MED ORDER — SODIUM CHLORIDE 0.9 % IV BOLUS (SEPSIS)
1000.0000 mL | Freq: Once | INTRAVENOUS | Status: AC
  Administered 2012-08-10: 1000 mL via INTRAVENOUS

## 2012-08-10 NOTE — ED Notes (Addendum)
Pt stat es seen 2 weeks ago for gallbladder pain. Pt took percocet and zofran and still has pain in her upper right to her left abdominal area. Pt is around [redacted] weeks pregnant.

## 2012-08-10 NOTE — Discharge Instructions (Signed)
Biliary Colic   Biliary colic is a steady or irregular pain in the upper abdomen. It is usually under the right side of the rib cage. It happens when gallstones interfere with the normal flow of bile from the gallbladder. Bile is a liquid that helps to digest fats. Bile is made in the liver and stored in the gallbladder. When you eat a meal, bile passes from the gallbladder through the cystic duct and the common bile duct into the small intestine. There, it mixes with partially digested food. If a gallstone blocks either of these ducts, the normal flow of bile is blocked. The muscle cells in the bile duct contract forcefully to try to move the stone. This causes the pain of biliary colic.   SYMPTOMS    A person with biliary colic usually complains of pain in the upper abdomen. This pain can be:   In the center of the upper abdomen just below the breastbone.   In the upper-right part of the abdomen, near the gallbladder and liver.   Spread back toward the right shoulder blade.   Nausea and vomiting.   The pain usually occurs after eating.   Biliary colic is usually triggered by the digestive system's demand for bile. The demand for bile is high after fatty meals. Symptoms can also occur when a person who has been fasting suddenly eats a very large meal. Most episodes of biliary colic pass after 1 to 5 hours. After the most intense pain passes, your abdomen may continue to ache mildly for about 24 hours.  DIAGNOSIS   After you describe your symptoms, your caregiver will perform a physical exam. He or she will pay attention to the upper right portion of your belly (abdomen). This is the area of your liver and gallbladder. An ultrasound will help your caregiver look for gallstones. Specialized scans of the gallbladder may also be done. Blood tests may be done, especially if you have fever or if your pain persists.  PREVENTION    Biliary colic can be prevented by controlling the risk factors for gallstones. Some of these risk factors, such as heredity, increasing age, and pregnancy are a normal part of life. Obesity and a high-fat diet are risk factors you can change through a healthy lifestyle. Women going through menopause who take hormone replacement therapy (estrogen) are also more likely to develop biliary colic.  TREATMENT    Pain medication may be prescribed.   You may be encouraged to eat a fat-free diet.   If the first episode of biliary colic is severe, or episodes of colic keep retuning, surgery to remove the gallbladder (cholecystectomy) is usually recommended. This procedure can be done through small incisions using an instrument called a laparoscope. The procedure often requires a brief stay in the hospital. Some people can leave the hospital the same day. It is the most widely used treatment in people troubled by painful gallstones. It is effective and safe, with no complications in more than 90% of cases.   If surgery cannot be done, medication that dissolves gallstones may be used. This medication is expensive and can take months or years to work. Only small stones will dissolve.   Rarely, medication to dissolve gallstones is combined with a procedure called shock-wave lithotripsy. This procedure uses carefully aimed shock waves to break up gallstones. In many people treated with this procedure, gallstones form again within a few years.  PROGNOSIS   If gallstones block your cystic duct or common bile  duct, you are at risk for repeated episodes of biliary colic. There is also a 25% chance that you will develop a gallbladder infection(acute cholecystitis), or some other complication of gallstones within 10 to 20 years. If you have surgery, schedule it at a time that is convenient for you and at a time when you are not sick.  HOME CARE INSTRUCTIONS    Drink plenty of clear fluids.   Avoid fatty, greasy or fried foods, or any foods that make your pain worse.   Take medications as directed.  SEEK MEDICAL  CARE IF:    You develop a fever over 100.5 F (38.1 C).   Your pain gets worse over time.   You develop nausea that prevents you from eating and drinking.   You develop vomiting.  SEEK IMMEDIATE MEDICAL CARE IF:    You have continuous or severe belly (abdominal) pain which is not relieved with medications.   You develop nausea and vomiting which is not relieved with medications.   You have symptoms of biliary colic and you suddenly develop a fever and shaking chills. This may signal cholecystitis. Call your caregiver immediately.   You develop a yellow color to your skin or the white part of your eyes (jaundice).  Document Released: 10/22/2005 Document Revised: 08/13/2011 Document Reviewed: 01/01/2008  [employer] Patient Information 2013 [employer].

## 2012-08-10 NOTE — ED Notes (Signed)
EDP reporting no need to call rapid response OB RN.

## 2012-08-10 NOTE — ED Notes (Signed)
EDP requested fluid challenge. Pt. Given ginger ale to sip.

## 2012-08-10 NOTE — ED Provider Notes (Signed)
History     CSN: [id number]  Arrival date & time 08/10/12  7829   First MD Initiated Contact with Patient 08/10/12 0703      Chief Complaint  Patient presents with  . Abdominal Pain    (Consider location/radiation/quality/duration/timing/severity/associated sxs/prior treatment) HPI Comments: Pt with known gallstones found with last ED visit on 2/25.  She is about [redacted] weeks pregnant with 3rd pregnancy.  Pt has not yet followed up with OB/GYN nor with general surgery regarding gallstones due to Medicaid has not been processed yet.  She was doing well until midnight last night, began having mild pain, took half a hydrocodone which didn't help much and then began vomiting severely.  This made pain worse.  Similar location but worse pain that last time.  Has vomited 4 times.  She reports pain is severe and sharp mostly in right flank area and radiates to upper epigastrium before tapering off.  No dysuria, urinary freq.  No vaginal bleeding.  Has felt baby moving.    Patient is a 32 y.o. female presenting with abdominal pain. The history is provided by the patient, a relative and medical records.  Abdominal Pain Associated symptoms: nausea and vomiting   Associated symptoms: no chills, no fever, no vaginal bleeding and no vaginal discharge     Past Medical History  Diagnosis Date  . Asthma   . Heart murmur   . Gallstones     History reviewed. No pertinent past surgical history.  Family History  Problem Relation Age of Onset  . ADD / ADHD      family hx  . Breast cancer      fhx  . Mental illness      fhx  . Heart disease      fhx  . Hypertension      fhx    History  Substance Use Topics  . Smoking status: Never Smoker   . Smokeless tobacco: Not on file  . Alcohol Use: No    OB History   Grav Para Term Preterm Abortions TAB SAB Ect Mult Living   1               Review of Systems  Constitutional: Negative for fever and chills.  Gastrointestinal: Positive for nausea,  vomiting and abdominal pain.  Genitourinary: Negative for vaginal bleeding and vaginal discharge.  Musculoskeletal: Negative for back pain.  Skin: Negative for rash.  All other systems reviewed and are negative.    Allergies  Review of patient's allergies indicates no known allergies.  Home Medications   Current Outpatient Rx  Name  Route  Sig  Dispense  Refill  . HYDROcodone-acetaminophen (VICODIN ES) 7.5-750 MG per tablet   Oral   Take 1 tablet by mouth every 8 (eight) hours as needed for pain.   30 tablet   2   . ondansetron (ZOFRAN ODT) 8 MG disintegrating tablet   Oral   Take 1 tablet (8 mg total) by mouth every 8 (eight) hours as needed for nausea.   15 tablet   0   . oxyCODONE-acetaminophen (PERCOCET/ROXICET) 5-325 MG per tablet   Oral   Take 1 tablet by mouth every 4 (four) hours as needed for pain.   20 tablet   0   . ranitidine (ZANTAC) 150 MG tablet   Oral   Take 150 mg by mouth daily as needed for heartburn.         . ondansetron (ZOFRAN-ODT) 8 MG disintegrating tablet  Oral   Take 1 tablet (8 mg total) by mouth every 12 (twelve) hours as needed for nausea.   20 tablet   0     BP 102/30  Pulse 71  Temp(Src) 97 F (36.1 C) (Oral)  Resp 16  SpO2 97%  LMP 02/14/2012  Physical Exam  Nursing note and vitals reviewed. Constitutional: She is oriented to person, place, and time. She appears well-developed and well-nourished.  HENT:  Head: Normocephalic and atraumatic.  Eyes: No scleral icterus.  Neck: Normal range of motion. Neck supple.  Cardiovascular: Normal rate and regular rhythm.   Pulmonary/Chest: Effort normal. No respiratory distress.  Abdominal: Soft. She exhibits no distension. There is tenderness. There is no rebound and no guarding.  Neurological: She is alert and oriented to person, place, and time.  Skin: Skin is warm and dry. No rash noted.    ED Course  Procedures (including critical care time)  Labs Reviewed  CBC WITH  DIFFERENTIAL - Abnormal; Notable for the following:    RBC 3.59 (*)    Hemoglobin 11.3 (*)    HCT 32.2 (*)    All other components within normal limits  COMPREHENSIVE METABOLIC PANEL - Abnormal; Notable for the following:    Glucose, Bld 104 (*)    Creatinine, Ser 0.46 (*)    Albumin 2.7 (*)    All other components within normal limits   No results found.   1. Biliary colic     ra sat is 100% and I interpret to be normal.  9:19 AM Pt feels much improved. Will try po challenge.    10:15 AM  I informed Dr. Earlean Shawl of pt's symptoms.    MDM  I reviewed prior ED note and U/S showing small numerous GS'.  Pt has no fever, no elevated WBC nor elevated LFT's.  Will give IVF's, IV analgesics and IV antiemetics.  wil let [facility] know sine pt has not yet.  Pt is not toxic appearing.  Pt requires outpt follow up rather than continued ED visits.  Pt has pain meds at home.          Gavin Pound. Rhyen Mazariego, MD 08/10/12 1015

## 2012-09-01 LAB — OB RESULTS CONSOLE ANTIBODY SCREEN: Antibody Screen: NEGATIVE

## 2012-09-01 LAB — OB RESULTS CONSOLE RPR: RPR: NONREACTIVE

## 2012-09-01 LAB — OB RESULTS CONSOLE GBS: GBS: NEGATIVE

## 2012-09-01 LAB — OB RESULTS CONSOLE HIV ANTIBODY (ROUTINE TESTING): HIV: NONREACTIVE

## 2012-09-11 ENCOUNTER — Ambulatory Visit (INDEPENDENT_AMBULATORY_CARE_PROVIDER_SITE_OTHER): Payer: Medicaid Other | Admitting: Surgery

## 2012-09-24 ENCOUNTER — Encounter (INDEPENDENT_AMBULATORY_CARE_PROVIDER_SITE_OTHER): Payer: Self-pay | Admitting: General Surgery

## 2012-09-24 ENCOUNTER — Ambulatory Visit (INDEPENDENT_AMBULATORY_CARE_PROVIDER_SITE_OTHER): Payer: Medicaid Other | Admitting: General Surgery

## 2012-09-24 VITALS — BP 120/78 | HR 78 | Temp 98.4°F | Resp 12 | Ht 65.0 in | Wt 269.0 lb

## 2012-09-24 DIAGNOSIS — K8020 Calculus of gallbladder without cholecystitis without obstruction: Secondary | ICD-10-CM

## 2012-09-24 NOTE — Progress Notes (Signed)
Chief Complaint  Patient presents with  . Cholelithiasis    HISTORY:  [name] [name] is a 32 y.o. female who presents to clinic with with RUQ pain that worsens with fatty foods.  She can only eat bland foods.  She has lost 10lbs since her first prenatal visit.  Her pain started in Feb and has gotten worse.  She has nausea daily and vomiting weekly.  She denies this pain prior to pregnancy.    Past Medical History  Diagnosis Date  . Asthma   . Heart murmur   . Gallstones        No past surgical history on file.    Current Outpatient Prescriptions  Medication Sig Dispense Refill  . HYDROcodone-acetaminophen (VICODIN ES) 7.5-750 MG per tablet Take 1 tablet by mouth every 8 (eight) hours as needed for pain.  30 tablet  2  . ondansetron (ZOFRAN-ODT) 8 MG disintegrating tablet Take 1 tablet (8 mg total) by mouth every 12 (twelve) hours as needed for nausea.  20 tablet  0  . oxyCODONE-acetaminophen (PERCOCET/ROXICET) 5-325 MG per tablet Take 1 tablet by mouth every 4 (four) hours as needed for pain.  20 tablet  0  . ranitidine (ZANTAC) 150 MG tablet Take 150 mg by mouth daily as needed for heartburn.       No current facility-administered medications for this visit.     No Known Allergies    Family History  Problem Relation Age of Onset  . ADD / ADHD      family hx  . Breast cancer      fhx  . Mental illness      fhx  . Heart disease      fhx  . Hypertension      fhx      History   Social History  . Marital Status: Single    Spouse Name: N/A    Number of Children: N/A  . Years of Education: N/A   Social History Main Topics  . Smoking status: Never Smoker   . Smokeless tobacco: None  . Alcohol Use: No  . Drug Use: No  . Sexually Active: None   Other Topics Concern  . None   Social History Narrative  . None       REVIEW OF SYSTEMS - PERTINENT POSITIVES ONLY: Review of Systems - General ROS: negative for - chills, fatigue or fever Respiratory ROS: no cough,  shortness of breath, or wheezing Cardiovascular ROS: no chest pain or dyspnea on exertion Gastrointestinal ROS: positive for - abdominal pain, heartburn and nausea/vomiting negative for - blood in stools or change in bowel habits Genito-Urinary ROS: no dysuria, trouble voiding, or hematuria  EXAM: Filed Vitals:   09/24/12 1007  BP: 120/78  Pulse: 78  Temp: 98.4 F (36.9 C)  Resp: 12    General appearance: alert and cooperative Resp: clear to auscultation bilaterally Cardio: regular rate and rhythm GI: normal findings: soft and abnormal findings:  mild RUQ tenderness   LABORATORY RESULTS: Available labs are reviewed  Lab Results  Component Value Date   ALT 11 08/10/2012   AST 17 08/10/2012   ALKPHOS 69 08/10/2012   BILITOT 0.3 08/10/2012     RADIOLOGY RESULTS:   Images and reports are reviewed. RUQ US IMPRESSION:  Small stones and sludge in the gallbladder. Small gallbladder polyp. No signs of inflammation.   ASSESSMENT AND PLAN: [name] [name] is a 32 y.o. F with gallstones.  I believe that her symptoms are most  likely being caused by her gallbladder.  Given that she is [redacted]wks pregnant, I have recommended surgery after delivery.  I have instructed her to continue her bland, liquid diet for now.  She will call the office after delivery to get this scheduled.   The anatomy & physiology of hepatobiliary & pancreatic function was discussed.  The pathophysiology of gallbladder dysfunction was discussed.  Natural history risks without surgery was discussed.   I feel the risks of no intervention will lead to serious problems that outweigh the operative risks; therefore, I recommended cholecystectomy to remove the pathology.  I explained laparoscopic techniques with possible need for an open approach.  Probable cholangiogram to evaluate the bilary tract was explained as well.    Risks such as bleeding, infection, abscess, leak, injury to other organs, need for further treatment, heart  attack, death, and other risks were discussed.  I noted a good likelihood this will help address the problem.  Possibility that this will not correct all abdominal symptoms was explained.  Goals of post-operative recovery were discussed as well.  We will work to minimize complications.  An educational handout further explaining the pathology and treatment options was given as well.  Questions were answered.  The patient expresses understanding & wishes to proceed with surgery.    Vanita Panda, MD Colon and Rectal Surgery / General Surgery [facility]      Visit Diagnoses: 1. Cholelithiases     Primary Care Physician: Evette Georges, MD

## 2012-09-24 NOTE — Patient Instructions (Signed)
See handout.  Call the office once your delivery is over to schedule surgery.

## 2012-10-30 ENCOUNTER — Encounter (HOSPITAL_COMMUNITY): Payer: Self-pay

## 2012-10-30 ENCOUNTER — Encounter (HOSPITAL_COMMUNITY): Payer: Self-pay | Admitting: Pharmacy Technician

## 2012-10-31 ENCOUNTER — Encounter (HOSPITAL_COMMUNITY): Payer: Self-pay

## 2012-10-31 ENCOUNTER — Encounter (HOSPITAL_COMMUNITY)
Admission: RE | Admit: 2012-10-31 | Discharge: 2012-10-31 | Disposition: A | Discharge status: Home / Self Care | Payer: Medicaid Other | Source: Ambulatory Visit | Attending: Obstetrics and Gynecology | Admitting: Obstetrics and Gynecology

## 2012-10-31 HISTORY — DX: Acute cholecystitis with chronic cholecystitis: K81.2

## 2012-10-31 HISTORY — DX: Headache: R51

## 2012-10-31 LAB — CBC
HCT: 31.9 % — ABNORMAL LOW (ref 36.0–46.0)
Hemoglobin: 10.6 g/dL — ABNORMAL LOW (ref 12.0–15.0)
WBC: 8.6 10*3/uL (ref 4.0–10.5)

## 2012-10-31 LAB — TYPE AND SCREEN
ABO/RH(D): A NEG
Antibody Screen: NEGATIVE

## 2012-10-31 LAB — ABO/RH: ABO/RH(D): A NEG

## 2012-10-31 NOTE — Patient Instructions (Addendum)
20 [name] [name]  10/31/2012   Your procedure is scheduled on:  11/03/12  Enter through the Main Entrance of [facility] at 6 AM.  Pick up the phone at the desk and dial [phone].   Call this number if you have problems the morning of surgery: [phone]   Remember:   Do not eat food:After Midnight.  Do not drink clear liquids: After Midnight.  Take these medicines the morning of surgery with A SIP OF WATER: Prilosec   Do not wear jewelry, make-up or nail polish.  Do not wear lotions, powders, or perfumes. You may wear deodorant.  Do not shave 48 hours prior to surgery.  Do not bring valuables to the hospital.  [facility] is not responsible                  for any belongings or valuables brought to the hospital.  Contacts, dentures or bridgework may not be worn into surgery.  Leave suitcase in the car. After surgery it may be brought to your room.  For patients admitted to the hospital, checkout time is 11:00 AM the day of                discharge.   Patients discharged the day of surgery will not be allowed to drive                   home.  Name and phone number of your driver: NA  Special Instructions: Shower using CHG 2 nights before surgery and the night before surgery.  If you shower the day of surgery use CHG.  Use special wash - you have one bottle of CHG for all showers.  You should use approximately 1/3 of the bottle for each shower.   Please read over the following fact sheets that you were given: Surgical Site Infection Prevention

## 2012-11-02 ENCOUNTER — Other Ambulatory Visit: Payer: Self-pay | Admitting: Obstetrics and Gynecology

## 2012-11-02 MED ORDER — DEXTROSE 5 % IV SOLN
3.0000 g | INTRAVENOUS | Status: AC
  Administered 2012-11-03: 3 g via INTRAVENOUS
  Filled 2012-11-02: qty 3000

## 2012-11-02 MED ORDER — DEXTROSE 5 % IV SOLN
2.0000 g | INTRAVENOUS | Status: DC
  Filled 2012-11-02: qty 2

## 2012-11-03 ENCOUNTER — Inpatient Hospital Stay (HOSPITAL_COMMUNITY): Payer: Medicaid Other | Admitting: Anesthesiology

## 2012-11-03 ENCOUNTER — Inpatient Hospital Stay (HOSPITAL_COMMUNITY)
Admission: AD | Admit: 2012-11-03 | Discharge: 2012-11-06 | DRG: 766 | Disposition: A | Discharge status: Home / Self Care | Payer: Medicaid Other | Source: Ambulatory Visit | Attending: Obstetrics and Gynecology | Admitting: Obstetrics and Gynecology

## 2012-11-03 ENCOUNTER — Encounter (HOSPITAL_COMMUNITY): Payer: Self-pay | Admitting: Anesthesiology

## 2012-11-03 ENCOUNTER — Encounter (HOSPITAL_COMMUNITY)
Admission: AD | Disposition: A | Discharge status: Home / Self Care | Payer: Self-pay | Source: Ambulatory Visit | Attending: Obstetrics and Gynecology

## 2012-11-03 ENCOUNTER — Encounter (HOSPITAL_COMMUNITY): Payer: Self-pay | Admitting: *Deleted

## 2012-11-03 DIAGNOSIS — O26899 Other specified pregnancy related conditions, unspecified trimester: Secondary | ICD-10-CM | POA: Diagnosis present

## 2012-11-03 DIAGNOSIS — G43909 Migraine, unspecified, not intractable, without status migrainosus: Secondary | ICD-10-CM

## 2012-11-03 DIAGNOSIS — K8020 Calculus of gallbladder without cholecystitis without obstruction: Secondary | ICD-10-CM

## 2012-11-03 DIAGNOSIS — E663 Overweight: Secondary | ICD-10-CM

## 2012-11-03 DIAGNOSIS — O321XX0 Maternal care for breech presentation, not applicable or unspecified: Principal | ICD-10-CM | POA: Diagnosis present

## 2012-11-03 LAB — COMPREHENSIVE METABOLIC PANEL
AST: 39 U/L — ABNORMAL HIGH (ref 0–37)
Albumin: 2.8 g/dL — ABNORMAL LOW (ref 3.5–5.2)
BUN: 6 mg/dL (ref 6–23)
Calcium: 9.3 mg/dL (ref 8.4–10.5)
Chloride: 103 mEq/L (ref 96–112)
Creatinine, Ser: 0.5 mg/dL (ref 0.50–1.10)
Total Protein: 6.3 g/dL (ref 6.0–8.3)

## 2012-11-03 LAB — URINALYSIS, ROUTINE W REFLEX MICROSCOPIC
Glucose, UA: NEGATIVE mg/dL
Hgb urine dipstick: NEGATIVE
Specific Gravity, Urine: >1.03 — ABNORMAL HIGH (ref 1.005–1.030)
pH: 6 (ref 5.0–8.0)

## 2012-11-03 LAB — URINE MICROSCOPIC-ADD ON

## 2012-11-03 LAB — TYPE AND SCREEN
ABO/RH(D): A NEG
Antibody Screen: NEGATIVE

## 2012-11-03 SURGERY — Surgical Case
Anesthesia: Spinal | Site: Abdomen | Wound class: Clean Contaminated

## 2012-11-03 MED ORDER — SODIUM CHLORIDE 0.9 % IJ SOLN: 3.0000 mL | INTRAMUSCULAR | Status: DC | PRN

## 2012-11-03 MED ORDER — MEPERIDINE HCL 25 MG/ML IJ SOLN: 6.2500 mg | INTRAMUSCULAR | Status: DC | PRN

## 2012-11-03 MED ORDER — ONDANSETRON HCL 4 MG/2ML IJ SOLN
INTRAMUSCULAR | Status: DC | PRN
  Administered 2012-11-03: 4 mg via INTRAVENOUS

## 2012-11-03 MED ORDER — OXYTOCIN 40 UNITS IN LACTATED RINGERS INFUSION - SIMPLE MED: 62.5000 mL/h | INTRAVENOUS | Status: AC

## 2012-11-03 MED ORDER — OXYCODONE-ACETAMINOPHEN 5-325 MG PO TABS
1.0000 | ORAL_TABLET | ORAL | Status: DC | PRN
  Administered 2012-11-04 (×2): 1 via ORAL
  Administered 2012-11-04 – 2012-11-05 (×6): 2 via ORAL
  Administered 2012-11-06: 1 via ORAL
  Filled 2012-11-03: qty 1
  Filled 2012-11-03 (×3): qty 2
  Filled 2012-11-03: qty 1
  Filled 2012-11-03: qty 2
  Filled 2012-11-03: qty 1
  Filled 2012-11-03 (×2): qty 2

## 2012-11-03 MED ORDER — PHENYLEPHRINE 40 MCG/ML (10ML) SYRINGE FOR IV PUSH (FOR BLOOD PRESSURE SUPPORT)
PREFILLED_SYRINGE | INTRAVENOUS | Status: AC
  Filled 2012-11-03: qty 10

## 2012-11-03 MED ORDER — LACTATED RINGERS IV SOLN
INTRAVENOUS | Status: DC | PRN
  Administered 2012-11-03: 09:00:00 via INTRAVENOUS

## 2012-11-03 MED ORDER — DIPHENHYDRAMINE HCL 50 MG/ML IJ SOLN: 12.5000 mg | INTRAMUSCULAR | Status: DC | PRN

## 2012-11-03 MED ORDER — PHENYLEPHRINE HCL 10 MG/ML IJ SOLN
INTRAMUSCULAR | Status: DC | PRN
  Administered 2012-11-03 (×6): 80 ug via INTRAVENOUS
  Administered 2012-11-03: 40 ug via INTRAVENOUS

## 2012-11-03 MED ORDER — OXYTOCIN 10 UNIT/ML IJ SOLN
40.0000 [IU] | INTRAVENOUS | Status: DC | PRN
  Administered 2012-11-03: 40 [IU] via INTRAVENOUS

## 2012-11-03 MED ORDER — SCOPOLAMINE 1 MG/3DAYS TD PT72
MEDICATED_PATCH | TRANSDERMAL | Status: AC
  Administered 2012-11-03: 1.5 mg via TRANSDERMAL
  Filled 2012-11-03: qty 1

## 2012-11-03 MED ORDER — DIBUCAINE 1 % RE OINT: 1.0000 "application " | TOPICAL_OINTMENT | RECTAL | Status: DC | PRN

## 2012-11-03 MED ORDER — CEFAZOLIN SODIUM-DEXTROSE 2-3 GM-% IV SOLR
2.0000 g | Freq: Three times a day (TID) | INTRAVENOUS | Status: AC
  Administered 2012-11-04: 2 g via INTRAVENOUS
  Filled 2012-11-03 (×2): qty 50

## 2012-11-03 MED ORDER — DIPHENHYDRAMINE HCL 50 MG/ML IJ SOLN: 25.0000 mg | INTRAMUSCULAR | Status: DC | PRN

## 2012-11-03 MED ORDER — MENTHOL 3 MG MT LOZG: 1.0000 | LOZENGE | OROMUCOSAL | Status: DC | PRN

## 2012-11-03 MED ORDER — WITCH HAZEL-GLYCERIN EX PADS: 1.0000 "application " | MEDICATED_PAD | CUTANEOUS | Status: DC | PRN

## 2012-11-03 MED ORDER — NALBUPHINE HCL 10 MG/ML IJ SOLN
5.0000 mg | INTRAMUSCULAR | Status: DC | PRN
  Filled 2012-11-03: qty 1

## 2012-11-03 MED ORDER — SCOPOLAMINE 1 MG/3DAYS TD PT72: 1.0000 | MEDICATED_PATCH | Freq: Once | TRANSDERMAL | Status: DC

## 2012-11-03 MED ORDER — FENTANYL CITRATE 0.05 MG/ML IJ SOLN
INTRAMUSCULAR | Status: DC | PRN
  Administered 2012-11-03: 15 ug via INTRATHECAL

## 2012-11-03 MED ORDER — EPHEDRINE 5 MG/ML INJ
INTRAVENOUS | Status: AC
  Filled 2012-11-03: qty 10

## 2012-11-03 MED ORDER — LACTATED RINGERS IV SOLN: INTRAVENOUS | Status: DC

## 2012-11-03 MED ORDER — PANTOPRAZOLE SODIUM 40 MG PO TBEC
40.0000 mg | DELAYED_RELEASE_TABLET | Freq: Every day | ORAL | Status: DC
  Administered 2012-11-04 – 2012-11-05 (×2): 40 mg via ORAL
  Filled 2012-11-03 (×4): qty 1

## 2012-11-03 MED ORDER — LANOLIN HYDROUS EX OINT: 1.0000 "application " | TOPICAL_OINTMENT | CUTANEOUS | Status: DC | PRN

## 2012-11-03 MED ORDER — FENTANYL CITRATE 0.05 MG/ML IJ SOLN
INTRAMUSCULAR | Status: AC
  Filled 2012-11-03: qty 2

## 2012-11-03 MED ORDER — ONDANSETRON HCL 4 MG/2ML IJ SOLN: 4.0000 mg | INTRAMUSCULAR | Status: DC | PRN

## 2012-11-03 MED ORDER — MORPHINE SULFATE (PF) 0.5 MG/ML IJ SOLN
INTRAMUSCULAR | Status: DC | PRN
  Administered 2012-11-03: .1 mg via INTRATHECAL

## 2012-11-03 MED ORDER — ZOLPIDEM TARTRATE 5 MG PO TABS: 5.0000 mg | ORAL_TABLET | Freq: Every evening | ORAL | Status: DC | PRN

## 2012-11-03 MED ORDER — TETANUS-DIPHTH-ACELL PERTUSSIS 5-2.5-18.5 LF-MCG/0.5 IM SUSP: 0.5000 mL | Freq: Once | INTRAMUSCULAR | Status: DC

## 2012-11-03 MED ORDER — NALBUPHINE SYRINGE 5 MG/0.5 ML
INJECTION | INTRAMUSCULAR | Status: AC
  Administered 2012-11-03: 5 mg via SUBCUTANEOUS
  Filled 2012-11-03: qty 0.5

## 2012-11-03 MED ORDER — SIMETHICONE 80 MG PO CHEW
80.0000 mg | CHEWABLE_TABLET | Freq: Three times a day (TID) | ORAL | Status: DC
  Administered 2012-11-03 – 2012-11-06 (×10): 80 mg via ORAL

## 2012-11-03 MED ORDER — MORPHINE SULFATE 0.5 MG/ML IJ SOLN
INTRAMUSCULAR | Status: AC
  Filled 2012-11-03: qty 10

## 2012-11-03 MED ORDER — OXYTOCIN 10 UNIT/ML IJ SOLN
INTRAMUSCULAR | Status: AC
  Filled 2012-11-03: qty 4

## 2012-11-03 MED ORDER — IBUPROFEN 600 MG PO TABS
600.0000 mg | ORAL_TABLET | Freq: Four times a day (QID) | ORAL | Status: DC
  Administered 2012-11-03 – 2012-11-06 (×11): 600 mg via ORAL
  Filled 2012-11-03 (×12): qty 1

## 2012-11-03 MED ORDER — KETOROLAC TROMETHAMINE 60 MG/2ML IM SOLN
INTRAMUSCULAR | Status: AC
  Administered 2012-11-03: 60 mg via INTRAMUSCULAR
  Filled 2012-11-03: qty 2

## 2012-11-03 MED ORDER — KETOROLAC TROMETHAMINE 60 MG/2ML IM SOLN: 60.0000 mg | Freq: Once | INTRAMUSCULAR | Status: AC | PRN

## 2012-11-03 MED ORDER — PROMETHAZINE HCL 25 MG/ML IJ SOLN: 6.2500 mg | INTRAMUSCULAR | Status: DC | PRN

## 2012-11-03 MED ORDER — KETOROLAC TROMETHAMINE 30 MG/ML IJ SOLN: 30.0000 mg | Freq: Four times a day (QID) | INTRAMUSCULAR | Status: AC | PRN

## 2012-11-03 MED ORDER — ONDANSETRON HCL 4 MG/2ML IJ SOLN
INTRAMUSCULAR | Status: AC
  Filled 2012-11-03: qty 2

## 2012-11-03 MED ORDER — BUPIVACAINE IN DEXTROSE 0.75-8.25 % IT SOLN
INTRATHECAL | Status: DC | PRN
  Administered 2012-11-03: 1.5 mL via INTRATHECAL

## 2012-11-03 MED ORDER — DIPHENHYDRAMINE HCL 25 MG PO CAPS
25.0000 mg | ORAL_CAPSULE | Freq: Four times a day (QID) | ORAL | Status: DC | PRN
  Administered 2012-11-03: 25 mg via ORAL

## 2012-11-03 MED ORDER — EPHEDRINE SULFATE 50 MG/ML IJ SOLN
INTRAMUSCULAR | Status: DC | PRN
  Administered 2012-11-03: 10 mg via INTRAVENOUS
  Administered 2012-11-03: 5 mg via INTRAVENOUS

## 2012-11-03 MED ORDER — METOCLOPRAMIDE HCL 5 MG/ML IJ SOLN: 10.0000 mg | Freq: Three times a day (TID) | INTRAMUSCULAR | Status: DC | PRN

## 2012-11-03 MED ORDER — LACTATED RINGERS IV SOLN
INTRAVENOUS | Status: DC
Start: 2012-11-03 — End: 2012-11-06

## 2012-11-03 MED ORDER — ONDANSETRON HCL 4 MG PO TABS: 4.0000 mg | ORAL_TABLET | ORAL | Status: DC | PRN

## 2012-11-03 MED ORDER — ONDANSETRON HCL 4 MG/2ML IJ SOLN: 4.0000 mg | Freq: Three times a day (TID) | INTRAMUSCULAR | Status: DC | PRN

## 2012-11-03 MED ORDER — NALOXONE HCL 0.4 MG/ML IJ SOLN: 0.4000 mg | INTRAMUSCULAR | Status: DC | PRN

## 2012-11-03 MED ORDER — SIMETHICONE 80 MG PO CHEW: 80.0000 mg | CHEWABLE_TABLET | ORAL | Status: DC | PRN

## 2012-11-03 MED ORDER — SCOPOLAMINE 1 MG/3DAYS TD PT72
1.0000 | MEDICATED_PATCH | Freq: Once | TRANSDERMAL | Status: DC
  Filled 2012-11-03: qty 1

## 2012-11-03 MED ORDER — NALOXONE HCL 1 MG/ML IJ SOLN
1.0000 ug/kg/h | INTRAMUSCULAR | Status: DC | PRN
  Filled 2012-11-03: qty 2

## 2012-11-03 MED ORDER — SENNOSIDES-DOCUSATE SODIUM 8.6-50 MG PO TABS
2.0000 | ORAL_TABLET | Freq: Every day | ORAL | Status: DC
  Administered 2012-11-03 – 2012-11-05 (×3): 2 via ORAL

## 2012-11-03 MED ORDER — LACTATED RINGERS IV SOLN
INTRAVENOUS | Status: DC
  Administered 2012-11-03: 125 mL/h via INTRAVENOUS
  Administered 2012-11-03: 08:00:00 via INTRAVENOUS

## 2012-11-03 MED ORDER — LACTATED RINGERS IV BOLUS (SEPSIS)
500.0000 mL | Freq: Once | INTRAVENOUS | Status: AC
  Administered 2012-11-03: 500 mL via INTRAVENOUS

## 2012-11-03 MED ORDER — MEASLES, MUMPS & RUBELLA VAC ~~LOC~~ INJ
0.5000 mL | INJECTION | Freq: Once | SUBCUTANEOUS | Status: DC
  Filled 2012-11-03: qty 0.5

## 2012-11-03 MED ORDER — DIPHENHYDRAMINE HCL 25 MG PO CAPS
25.0000 mg | ORAL_CAPSULE | ORAL | Status: DC | PRN
  Filled 2012-11-03: qty 1

## 2012-11-03 SURGICAL SUPPLY — 32 items
CLAMP CORD UMBIL (MISCELLANEOUS) IMPLANT
CLOTH BEACON ORANGE TIMEOUT ST (SAFETY) ×2 IMPLANT
CONTAINER PREFILL 10% NBF 15ML (MISCELLANEOUS) IMPLANT
DRAPE LG THREE QUARTER DISP (DRAPES) ×2 IMPLANT
DRSG OPSITE 6X11 MED (GAUZE/BANDAGES/DRESSINGS) ×2 IMPLANT
DRSG OPSITE POSTOP 4X10 (GAUZE/BANDAGES/DRESSINGS) ×2 IMPLANT
DRSG VASELINE 3X18 (GAUZE/BANDAGES/DRESSINGS) ×2 IMPLANT
DURAPREP 26ML APPLICATOR (WOUND CARE) ×2 IMPLANT
ELECT REM PT RETURN 9FT ADLT (ELECTROSURGICAL) ×2
ELECTRODE REM PT RTRN 9FT ADLT (ELECTROSURGICAL) ×1 IMPLANT
EXTRACTOR VACUUM KIWI (MISCELLANEOUS) IMPLANT
EXTRACTOR VACUUM M CUP 4 TUBE (SUCTIONS) IMPLANT
GLOVE BIO SURGEON STRL SZ7.5 (GLOVE) ×2 IMPLANT
GOWN PREVENTION PLUS XLARGE (GOWN DISPOSABLE) ×2 IMPLANT
GOWN STRL REIN XL XLG (GOWN DISPOSABLE) ×4 IMPLANT
KIT ABG SYR 3ML LUER SLIP (SYRINGE) IMPLANT
NEEDLE HYPO 25X5/8 SAFETYGLIDE (NEEDLE) IMPLANT
NS IRRIG 1000ML POUR BTL (IV SOLUTION) ×2 IMPLANT
PACK C SECTION WH (CUSTOM PROCEDURE TRAY) ×2 IMPLANT
PAD OB MATERNITY 4.3X12.25 (PERSONAL CARE ITEMS) ×2 IMPLANT
RTRCTR C-SECT PINK 25CM LRG (MISCELLANEOUS) ×2 IMPLANT
STAPLER VISISTAT 35W (STAPLE) IMPLANT
SUT PLAIN 0 NONE (SUTURE) IMPLANT
SUT VIC AB 0 CT1 36 (SUTURE) ×16 IMPLANT
SUT VIC AB 3-0 CTX 36 (SUTURE) ×2 IMPLANT
SUT VIC AB 3-0 SH 27 (SUTURE)
SUT VIC AB 3-0 SH 27X BRD (SUTURE) IMPLANT
SUT VIC AB 4-0 KS 27 (SUTURE) IMPLANT
SUT VICRYL 0 TIES 12 18 (SUTURE) IMPLANT
TOWEL OR 17X24 6PK STRL BLUE (TOWEL DISPOSABLE) ×6 IMPLANT
TRAY FOLEY CATH 14FR (SET/KITS/TRAYS/PACK) ×2 IMPLANT
WATER STERILE IRR 1000ML POUR (IV SOLUTION) IMPLANT

## 2012-11-03 NOTE — Progress Notes (Signed)
Patient ID: [name] [name], female   DOB: [birth year], 32 y.o.   MRN: [id number] Pt was examined 10-31-12 and she reports no change in her health since that time. Bedside US confirms breech presentation.

## 2012-11-03 NOTE — Anesthesia Procedure Notes (Addendum)
Spinal  Patient location during procedure: OR Start time: 11/03/2012 8:19 AM Staffing Performed by: anesthesiologist  Preanesthetic Checklist Completed: patient identified, site marked, surgical consent, pre-op evaluation, timeout performed, IV checked, risks and benefits discussed and monitors and equipment checked Spinal Block Patient position: sitting Prep: site prepped and draped and DuraPrep Patient monitoring: heart rate, continuous pulse ox and blood pressure Approach: midline Location: L3-4 Injection technique: single-shot Needle Needle type: Pencan  Needle gauge: 24 G Needle length: 9 cm Assessment Sensory level: T4 Additional Notes Clear free flow CSF on first attempt.  No paresthesia.  Patient tolerated procedure well with no apparent complications.  Jasmine December, MD  Performed by: Kiandre Spagnolo, Jannet Askew

## 2012-11-03 NOTE — Op Note (Signed)
NAMEAAMARI, WEST NO.:  [id number]  MEDICAL RECORD NO.:  [id number]  LOCATION:  [facility]                          FACILITY:  [facility]  PHYSICIAN:  Malachi Pro. Ambrose Mantle, M.D. DATE OF BIRTH:  11/20/1980  DATE OF PROCEDURE:  11/03/2012 DATE OF DISCHARGE:                              OPERATIVE REPORT   PREOPERATIVE DIAGNOSIS:  Intrauterine pregnancy at 39 weeks, persistent breech presentation, declined version.  POSTOPERATIVE DIAGNOSIS:  Intrauterine pregnancy at 39 weeks, persistent breech presentation, declined version.  OPERATION:  Low-transverse cervical cesarean section.  OPERATOR:  Malachi Pro. Ambrose Mantle, M.D.  ASSISTANT:  Mysinger.  ANESTHESIA:  Spinal anesthesia, Dr. Rodman Pickle.  DESCRIPTION OF PROCEDURE:  The patient was brought to the operating room, after ultrasound confirmed breech presentation.  She was given a spinal anesthetic by Dr. Rodman Pickle.  She was placed supine on the table. A marking pen was used to outline the incision.  A Foley catheter was inserted under sterile conditions, and the abdominal wall was prepped with DuraPrep by the RN.  A time-out was done and after 3 minutes expired, excess DuraPrep was wiped away, and the abdomen was draped as a sterile field.  Anesthesia was confirmed and a transverse incision was made and carried in layers through the skin, subcutaneous tissue, and fascia.  The fascia was separated from the rectus muscles superiorly and inferiorly.  The rectus muscle was split in the midline.  Peritoneum was opened vertically.  An Alexis retractor was inserted into the abdominal cavity for exposure of the lower uterine segment.  A short transverse incision was made through the superficial layers of the myometrium.  I went into the amniotic sac with my finger, obtained clear fluid. Enlarged the incision by pulling superiorly and inferiorly, and then delivered the baby as a breech.  The blood was delivered first.  There was a complete  breech on 1 side, the other was frank breech.  The baby delivered without any difficulty.  The nose and pharynx was suctioned with the bulb.  Cord was clamped.  The infant was given to Dr. Algernon Huxley, who was in attendance.  He assigned the female infant Apgars of 9 and 9 at 1 and 5 minutes.  I took my time to deliver the placenta because the cord blood lady was waiting to accept it.  After the placenta removed mostly spontaneously, I inspected inside of the uterus, found it to be free of any major debris, I pulled some membranes out, then closed the incision with a running suture of 0 Vicryl and locking the first layer. I then used an imbricating suture of the same material to 0 Vicryl on the second layer to obtain complete hemostasis.  Inspected the uterus, tubes, and ovaries.  They all appeared normal, and then removed the Alexis retractor.  I closed the abdominal wall with interrupted sutures of 0 Vicryl on the rectus muscle and peritoneum in 1 layer, 2 running sutures of 0 Vicryl on the fascia, running 3-0 Vicryl in the subcutaneous tissue and staples on the skin. The patient seemed to tolerate the procedure well.  Blood loss was estimated at 1000 mL.  Sponge and needle counts were correct.  She was returned  to recovery in satisfactory condition.     Malachi Pro. Ambrose Mantle, M.D.     TFH/MEDQ  D:  11/03/2012  T:  11/03/2012  Job:  [id number]

## 2012-11-03 NOTE — Anesthesia Postprocedure Evaluation (Signed)
  Anesthesia Post-op Note  Patient: [name] [name]  Procedure(s) Performed: Procedure(s): PRIMARY CESAREAN SECTION (N/A)  Patient Location: PACU and Mother/Baby  Anesthesia Type:Spinal  Level of Consciousness: awake, alert , oriented and patient cooperative  Airway and Oxygen Therapy: Patient Spontanous Breathing  Post-op Pain: none  Post-op Assessment: Post-op Vital signs reviewed, Patient's Cardiovascular Status Stable and Respiratory Function Stable  Post-op Vital Signs: Reviewed  Complications: No apparent anesthesia complications

## 2012-11-03 NOTE — Anesthesia Postprocedure Evaluation (Signed)
  Anesthesia Post-op Note  Anesthesia Post Note  Patient: [name] [name]  Procedure(s) Performed: Procedure(s) (LRB): PRIMARY CESAREAN SECTION (N/A)  Anesthesia type: Spinal  Patient location: PACU  Post pain: Pain level controlled  Post assessment: Post-op Vital signs reviewed  Last Vitals:  Filed Vitals:   11/03/12 1000  BP: 112/53  Pulse: 72  Temp:   Resp: 25    Post vital signs: Reviewed  Level of consciousness: awake  Complications: No apparent anesthesia complications

## 2012-11-03 NOTE — H&P (Signed)
NAME:  [name] [name], [name] [name]                  ACCOUNT NO.:  MEDICAL RECORD NO.:  [id number]  LOCATION:                                 FACILITY:  PHYSICIAN:  Malachi Pro. Ambrose Mantle, M.D.      DATE OF BIRTH:  DATE OF ADMISSION:  11/02/2012 DATE OF DISCHARGE:                             HISTORY & PHYSICAL   PRESENT ILLNESS:  This is a 32 year old white female, para 2-0-0-2, gravida 3, EDC 06/09, 2014, by an ultrasound at 28 and 2/7th weeks done on 05/21/2012.  The patient began her prenatal course at our office on 06/06/2012,  Blood group and type A, negative antibody.  Pap smear, low- grade, possible high-grade SIL HPV positive, rubella immune, RPR nonreactive.  Urine culture positive, greater than 100,000 colonies per mL of E. Coli.  Hepatitis B surface antigen negative.  HIV negative.  GC and Chlamydia negative.  Too advanced for first trimester screening, negative cystic fibrosis screening.  Liver function tests normal.  Quad screen negative.  At the time she presented for her first prenatal visit, her BMI was 46.  She had a history of generalized pruritus. Liver function tests and bile acids were normal.  Because of the abnormal Pap smear, she underwent colposcopy which had minimal findings. The patient claimed that she had a VSD fetal echo cardiogram that was normal.  She was advised to see a dermatologist if her itching persisted.  I advised her to have a weight gain of 11 to 20 pounds. Colposcopy showed minimally white epithelium at the squamocolumnar junction on the anterior and posterior lips.  The patient's one hour Glucola was 132.  Group B strep culture was negative.  The patient was noted to have a breech presentation on 10/07/2012, and breech remained present throughout the rest of the pregnancy.  She was offered a version by one of my associates and she declined.  She is admitted now for C- section because of breech presentation which appears to be a footling breech.  PAST  MEDICAL HISTORY:  She does have a history of asthma.  She has had a history of a cardiac murmur, depression, migraines, and generalized pruritus.  SURGICAL HISTORY:  None.  ALLERGIES:  No known drug allergies.  No latex allergies.  SOCIAL HISTORY:  Former user of alcohol.  Never used drugs.  Never used tobacco.  PREGNANCY HISTORY:  In 2001 and 2002, delivered vaginally an 8 pound and 8 ounce female and a 5 pound 13 ounce female.  Deliveries were done by me and Dr. Jackelyn Knife.  PHYSICAL EXAMINATION:  GENERAL:  On admission this is a quite obese, white female, in no acute distress.  The exam was done on Oct 31, 2012. VITAL SIGNS:  Weight is 269 pounds, blood pressure 110/80, and pulse is 80. HEAD, EYES, NOSE, AND THROAT:  Normal. LUNGS:  Clear to auscultation. HEART:  Normal size and sounds.  No murmurs. ABDOMEN:  Soft.  Fundal height.  39 cm.  Fetal heart tones normal. Cervix 2 to 3 cm dilated, 40% effaced, and I can feel a foot above the cervix.  ADMITTING IMPRESSION:  Intrauterine pregnancy at 39 weeks, in breech presentation, declined attempted  version.  The patient is admitted for C- section.  She understands the risks and is ready to proceed.     Malachi Pro. Ambrose Mantle, M.D.     TFH/MEDQ  D:  11/02/2012  T:  11/02/2012  Job:  [id number]

## 2012-11-03 NOTE — Transfer of Care (Signed)
Immediate Anesthesia Transfer of Care Note  Patient: [name] [name]  Procedure(s) Performed: Procedure(s): PRIMARY CESAREAN SECTION (N/A)  Patient Location: PACU  Anesthesia Type:Spinal  Level of Consciousness: awake, alert  and oriented  Airway & Oxygen Therapy: Patient Spontanous Breathing  Post-op Assessment: Report given to PACU RN and Post -op Vital signs reviewed and stable  Post vital signs: Reviewed and stable  Complications: No apparent anesthesia complications

## 2012-11-03 NOTE — Anesthesia Preprocedure Evaluation (Addendum)
Anesthesia Evaluation  Patient identified by MRN, date of birth, ID band Patient awake    Reviewed: Allergy & Precautions, H&P , NPO status , Patient's Chart, lab work & pertinent test results  Airway Mallampati: I TM Distance: >3 FB Neck ROM: Full    Dental no notable dental hx.    Pulmonary neg pulmonary ROS,  breath sounds clear to auscultation  Pulmonary exam normal       Cardiovascular negative cardio ROS  Rhythm:Regular Rate:Normal     Neuro/Psych negative neurological ROS  negative psych ROS   GI/Hepatic negative GI ROS, Neg liver ROS,   Endo/Other  Morbid obesity  Renal/GU negative Renal ROS  negative genitourinary   Musculoskeletal negative musculoskeletal ROS (+)   Abdominal   Peds negative pediatric ROS (+)  Hematology negative hematology ROS (+)   Anesthesia Other Findings   Reproductive/Obstetrics (+) Pregnancy                         Anesthesia Physical Anesthesia Plan  ASA: II  Anesthesia Plan: Spinal   Post-op Pain Management:    Induction:   Airway Management Planned:   Additional Equipment:   Intra-op Plan:   Post-operative Plan:   Informed Consent: I have reviewed the patients History and Physical, chart, labs and discussed the procedure including the risks, benefits and alternatives for the proposed anesthesia with the patient or authorized representative who has indicated his/her understanding and acceptance.   Dental advisory given  Plan Discussed with: CRNA  Anesthesia Plan Comments:         Anesthesia Quick Evaluation

## 2012-11-04 ENCOUNTER — Encounter (HOSPITAL_COMMUNITY): Payer: Self-pay | Admitting: Obstetrics and Gynecology

## 2012-11-04 LAB — CBC
HCT: 26.5 % — ABNORMAL LOW (ref 36.0–46.0)
Hemoglobin: 9 g/dL — ABNORMAL LOW (ref 12.0–15.0)
RBC: 3 MIL/uL — ABNORMAL LOW (ref 3.87–5.11)
RDW: 14.4 % (ref 11.5–15.5)
WBC: 7.6 10*3/uL (ref 4.0–10.5)

## 2012-11-04 MED ORDER — RHO D IMMUNE GLOBULIN 1500 UNIT/2ML IJ SOLN
300.0000 ug | Freq: Once | INTRAMUSCULAR | Status: AC
  Administered 2012-11-04: 300 ug via INTRAMUSCULAR
  Filled 2012-11-04: qty 2

## 2012-11-04 NOTE — Progress Notes (Signed)
Patient ID: [name] [name], female   DOB: 1980-06-21, 32 y.o.   MRN: [id number] #1 afebrile BP normal output good voiding well, tolerating a diet and ambulating. No flatus yet HGB stable

## 2012-11-05 ENCOUNTER — Inpatient Hospital Stay (HOSPITAL_COMMUNITY): Payer: Medicaid Other

## 2012-11-05 LAB — RH IG WORKUP (INCLUDES ABO/RH)
ABO/RH(D): A NEG
Fetal Screen: NEGATIVE
Unit division: 0

## 2012-11-05 NOTE — Progress Notes (Signed)
Patient ID: [name] [name], female   DOB: 1981-03-11, 32 y.o.   MRN: [id number] #2 afebrile BP normal Has pain in right mid abdomen Has a good appetite no flatus The pain is constant PE there is a fullness in the right mid abdomen that I assume is above the uterus I will get flat and upright abdominal films

## 2012-11-05 NOTE — Progress Notes (Signed)
Patient ID: [name] [name], female   DOB: 27-Nov-1980, 32 y.o.   MRN: [id number] Pt has passed some flatus. X-rays this AM showed colonic ileus with no small bowel dilatation

## 2012-11-06 DIAGNOSIS — O321XX0 Maternal care for breech presentation, not applicable or unspecified: Principal | ICD-10-CM

## 2012-11-06 DIAGNOSIS — K8020 Calculus of gallbladder without cholecystitis without obstruction: Secondary | ICD-10-CM

## 2012-11-06 MED ORDER — OXYCODONE-ACETAMINOPHEN 5-325 MG PO TABS: 1.0000 | ORAL_TABLET | Freq: Four times a day (QID) | ORAL | Status: DC | PRN

## 2012-11-06 MED ORDER — IBUPROFEN 600 MG PO TABS: 600.0000 mg | ORAL_TABLET | Freq: Four times a day (QID) | ORAL | Status: DC | PRN

## 2012-11-06 NOTE — Progress Notes (Signed)
Patient ID: [name] [name], female   DOB: 07/23/1980, 32 y.o.   MRN: [id number] #3 afebrile Passing flatus I have reviewed the x rays with the radiologist and the films are as described. Her abdomen is softer Will d/c

## 2012-11-06 NOTE — Discharge Summary (Signed)
NAMECARLOYN, [name] [name] NO.:  [id number]  MEDICAL RECORD NO.:  [id number]  LOCATION:  9123                          [facility]ACILITY:  [facility]  PHYSICIAN:  Malachi Pro. Ambrose Mantle, M.D. DATE OF BIRTH:  09-17-1980  DATE OF ADMISSION:  11/03/2012 DATE OF DISCHARGE:  11/06/2012                              DISCHARGE SUMMARY   This is a 32 year old white female, para 2-0-0-2 gravida 3 with [facility] November 10, 2012, by an ultrasound at 15-2/7 weeks who was admitted for C-section because of persistent breech presentation and declined external cephalic version.  The patient's prenatal labs were normal.  She was found to have gallstones during the pregnancy, but mainly her prenatal course was uncomplicated except for persistent breech presentation.  The patient underwent a low-transverse cervical C-section by Dr. Ambrose Mantle with Dr. Jackelyn Knife assisting under spinal anesthesia on November 03, 2012, did well postoperatively.  On the second postop day said she had "mystery pain" in her right mid abdomen.  Exam revealed a fullness in the right mid and central abdomen.  Flat and upright abdominal films showed colonic ileus with no small bowel dilatation.  Subsequently, the patient has begun to pass flatus and she has remained afebrile with a normal appetite and no nausea or vomiting.  She is felt to be a candidate for discharge at this time.  LABORATORY DATA:  Her preop labs of comprehensive metabolic profile was unremarkable.  Even though the labs are not in her chart, her preop hemoglobin was 10.  Postoperatively, her hemoglobin was 9.0, hematocrit 26.5, white count 7600, platelet count 179,000 with a normal differential.  The patient received RhoGAM on November 06, 2012,  FINAL DIAGNOSES:  Intrauterine pregnancy at 39 weeks with breech presentation, cholelithiasis.  OPERATION:  Low-transverse cervical C-section.  FINAL CONDITION:  Improved.  Instructions include our regular discharge instruction booklet as  well as the after visit summary.  Prescriptions for Motrin 600 mg 30 tablets 1 every 6 hours as needed for pain and Percocet 5/325, 30 tablets, 1 every 6 hours as needed for pain.  She is asked to return to the office in 5-7 days for followup examination, removal of staples and application of Steri-Strips.  She is to call with any unusual problems.     Malachi Pro. Ambrose Mantle, M.D.     TFH/MEDQ  D:  11/06/2012  T:  11/06/2012  Job:  [id number]

## 2012-11-08 ENCOUNTER — Inpatient Hospital Stay (HOSPITAL_COMMUNITY): Payer: Medicaid Other | Admitting: Anesthesiology

## 2012-11-08 ENCOUNTER — Encounter (HOSPITAL_COMMUNITY): Payer: Self-pay | Admitting: *Deleted

## 2012-11-08 ENCOUNTER — Encounter (HOSPITAL_COMMUNITY)
Admission: AD | Disposition: A | Discharge status: Home / Self Care | Payer: Self-pay | Source: Ambulatory Visit | Attending: Obstetrics and Gynecology

## 2012-11-08 ENCOUNTER — Encounter (HOSPITAL_COMMUNITY): Payer: Self-pay | Admitting: Anesthesiology

## 2012-11-08 ENCOUNTER — Inpatient Hospital Stay (HOSPITAL_COMMUNITY)
Admission: AD | Admit: 2012-11-08 | Discharge: 2012-11-19 | DRG: 769 | Disposition: A | Discharge status: Home / Self Care | Payer: Medicaid Other | Source: Ambulatory Visit | Attending: Obstetrics and Gynecology | Admitting: Obstetrics and Gynecology

## 2012-11-08 ENCOUNTER — Telehealth: Payer: Self-pay | Admitting: Diagnostic Radiology

## 2012-11-08 ENCOUNTER — Inpatient Hospital Stay (HOSPITAL_COMMUNITY): Payer: Medicaid Other

## 2012-11-08 DIAGNOSIS — O9089 Other complications of the puerperium, not elsewhere classified: Secondary | ICD-10-CM | POA: Diagnosis present

## 2012-11-08 DIAGNOSIS — R5383 Other fatigue: Secondary | ICD-10-CM | POA: Diagnosis present

## 2012-11-08 DIAGNOSIS — R5381 Other malaise: Secondary | ICD-10-CM | POA: Diagnosis present

## 2012-11-08 DIAGNOSIS — E669 Obesity, unspecified: Secondary | ICD-10-CM | POA: Diagnosis present

## 2012-11-08 DIAGNOSIS — K8020 Calculus of gallbladder without cholecystitis without obstruction: Secondary | ICD-10-CM | POA: Diagnosis present

## 2012-11-08 DIAGNOSIS — K631 Perforation of intestine (nontraumatic): Secondary | ICD-10-CM

## 2012-11-08 DIAGNOSIS — O85 Puerperal sepsis: Principal | ICD-10-CM | POA: Diagnosis present

## 2012-11-08 DIAGNOSIS — D3A020 Benign carcinoid tumor of the appendix: Secondary | ICD-10-CM | POA: Diagnosis present

## 2012-11-08 DIAGNOSIS — R112 Nausea with vomiting, unspecified: Secondary | ICD-10-CM | POA: Diagnosis present

## 2012-11-08 DIAGNOSIS — K929 Disease of digestive system, unspecified: Secondary | ICD-10-CM | POA: Diagnosis present

## 2012-11-08 DIAGNOSIS — C7A020 Malignant carcinoid tumor of the appendix: Secondary | ICD-10-CM

## 2012-11-08 DIAGNOSIS — K560 Paralytic ileus: Secondary | ICD-10-CM | POA: Diagnosis not present

## 2012-11-08 DIAGNOSIS — Z9889 Other specified postprocedural states: Secondary | ICD-10-CM

## 2012-11-08 DIAGNOSIS — K35209 Acute appendicitis with generalized peritonitis, without abscess, unspecified as to perforation: Secondary | ICD-10-CM | POA: Diagnosis present

## 2012-11-08 DIAGNOSIS — K352 Acute appendicitis with generalized peritonitis, without abscess: Secondary | ICD-10-CM | POA: Diagnosis present

## 2012-11-08 HISTORY — PX: LAPAROTOMY: SHX154

## 2012-11-08 LAB — CBC
HCT: 26.4 % — ABNORMAL LOW (ref 36.0–46.0)
MCH: 29.9 pg (ref 26.0–34.0)
MCHC: 33.7 g/dL (ref 30.0–36.0)
MCV: 90.9 fL (ref 78.0–100.0)
Platelets: 277 10*3/uL (ref 150–400)
RBC: 3.28 MIL/uL — ABNORMAL LOW (ref 3.87–5.11)
RDW: 14.5 % (ref 11.5–15.5)

## 2012-11-08 LAB — CREATININE, SERUM
Creatinine, Ser: 0.44 mg/dL — ABNORMAL LOW (ref 0.50–1.10)
GFR calc non Af Amer: >90 mL/min (ref >90)

## 2012-11-08 LAB — COMPREHENSIVE METABOLIC PANEL
AST: 14 U/L (ref 0–37)
CO2: 25 mEq/L (ref 19–32)
Calcium: 8.8 mg/dL (ref 8.4–10.5)
Creatinine, Ser: 0.61 mg/dL (ref 0.50–1.10)
GFR calc Af Amer: >90 mL/min (ref >90)
GFR calc non Af Amer: >90 mL/min (ref >90)

## 2012-11-08 LAB — TYPE AND SCREEN
ABO/RH(D): A NEG
Antibody Screen: POSITIVE

## 2012-11-08 LAB — GRAM STAIN

## 2012-11-08 SURGERY — LAPAROTOMY, EXPLORATORY
Anesthesia: General | Site: Abdomen | Wound class: Contaminated

## 2012-11-08 MED ORDER — SUCCINYLCHOLINE CHLORIDE 20 MG/ML IJ SOLN
INTRAMUSCULAR | Status: DC | PRN
  Administered 2012-11-08: 100 mg via INTRAVENOUS

## 2012-11-08 MED ORDER — BUPIVACAINE-EPINEPHRINE PF 0.25-1:200000 % IJ SOLN
INTRAMUSCULAR | Status: DC | PRN
  Administered 2012-11-08: 10 mL

## 2012-11-08 MED ORDER — PROPOFOL 10 MG/ML IV BOLUS
INTRAVENOUS | Status: DC | PRN
  Administered 2012-11-08: 180 mg via INTRAVENOUS
  Administered 2012-11-08: 20 mg via INTRAVENOUS

## 2012-11-08 MED ORDER — FENTANYL CITRATE 0.05 MG/ML IJ SOLN: 25.0000 ug | INTRAMUSCULAR | Status: DC | PRN

## 2012-11-08 MED ORDER — SODIUM CHLORIDE 0.9 % IJ SOLN: 9.0000 mL | INTRAMUSCULAR | Status: DC | PRN

## 2012-11-08 MED ORDER — KETOROLAC TROMETHAMINE 60 MG/2ML IM SOLN
60.0000 mg | Freq: Once | INTRAMUSCULAR | Status: AC
  Administered 2012-11-08: 60 mg via INTRAMUSCULAR
  Filled 2012-11-08: qty 2

## 2012-11-08 MED ORDER — PROMETHAZINE HCL 25 MG/ML IJ SOLN: 6.2500 mg | INTRAMUSCULAR | Status: DC | PRN

## 2012-11-08 MED ORDER — ONDANSETRON HCL 4 MG PO TABS: 4.0000 mg | ORAL_TABLET | Freq: Four times a day (QID) | ORAL | Status: DC | PRN

## 2012-11-08 MED ORDER — DIPHENHYDRAMINE HCL 12.5 MG/5ML PO ELIX: 12.5000 mg | ORAL_SOLUTION | Freq: Four times a day (QID) | ORAL | Status: DC | PRN

## 2012-11-08 MED ORDER — ONDANSETRON HCL 4 MG/2ML IJ SOLN
4.0000 mg | Freq: Four times a day (QID) | INTRAMUSCULAR | Status: DC | PRN
  Administered 2012-11-11: 4 mg via INTRAVENOUS
  Filled 2012-11-08 (×2): qty 2

## 2012-11-08 MED ORDER — CISATRACURIUM BESYLATE (PF) 10 MG/5ML IV SOLN
INTRAVENOUS | Status: DC | PRN
  Administered 2012-11-08 (×3): 2 mg via INTRAVENOUS
  Administered 2012-11-08: 6 mg via INTRAVENOUS
  Administered 2012-11-08 (×2): 4 mg via INTRAVENOUS
  Administered 2012-11-08: 2 mg via INTRAVENOUS

## 2012-11-08 MED ORDER — HYDROMORPHONE HCL PF 1 MG/ML IJ SOLN
INTRAMUSCULAR | Status: AC
  Filled 2012-11-08: qty 1

## 2012-11-08 MED ORDER — MORPHINE SULFATE (PF) 1 MG/ML IV SOLN
INTRAVENOUS | Status: AC
  Administered 2012-11-08: 14:00:00
  Filled 2012-11-08: qty 25

## 2012-11-08 MED ORDER — NALOXONE HCL 0.4 MG/ML IJ SOLN: 0.4000 mg | INTRAMUSCULAR | Status: DC | PRN

## 2012-11-08 MED ORDER — BUPIVACAINE-EPINEPHRINE PF 0.25-1:200000 % IJ SOLN
INTRAMUSCULAR | Status: AC
  Filled 2012-11-08: qty 30

## 2012-11-08 MED ORDER — KCL IN DEXTROSE-NACL 20-5-0.9 MEQ/L-%-% IV SOLN
INTRAVENOUS | Status: AC
  Filled 2012-11-08: qty 1000

## 2012-11-08 MED ORDER — ONDANSETRON HCL 4 MG/2ML IJ SOLN
INTRAMUSCULAR | Status: DC | PRN
  Administered 2012-11-08: 4 mg via INTRAVENOUS

## 2012-11-08 MED ORDER — DIPHENHYDRAMINE HCL 50 MG/ML IJ SOLN: 12.5000 mg | Freq: Four times a day (QID) | INTRAMUSCULAR | Status: DC | PRN

## 2012-11-08 MED ORDER — FENTANYL CITRATE 0.05 MG/ML IJ SOLN
INTRAMUSCULAR | Status: DC | PRN
  Administered 2012-11-08: 50 ug via INTRAVENOUS
  Administered 2012-11-08 (×3): 100 ug via INTRAVENOUS

## 2012-11-08 MED ORDER — GLYCOPYRROLATE 0.2 MG/ML IJ SOLN
INTRAMUSCULAR | Status: DC | PRN
  Administered 2012-11-08: .8 mg via INTRAVENOUS

## 2012-11-08 MED ORDER — SODIUM CHLORIDE 0.9 % IR SOLN
Status: DC | PRN
  Administered 2012-11-08: 1000 mL

## 2012-11-08 MED ORDER — MIDAZOLAM HCL 5 MG/5ML IJ SOLN
INTRAMUSCULAR | Status: DC | PRN
  Administered 2012-11-08: 2 mg via INTRAVENOUS

## 2012-11-08 MED ORDER — ONDANSETRON HCL 4 MG/2ML IJ SOLN
4.0000 mg | Freq: Four times a day (QID) | INTRAMUSCULAR | Status: DC | PRN
  Administered 2012-11-12 – 2012-11-13 (×2): 4 mg via INTRAVENOUS
  Filled 2012-11-08: qty 2

## 2012-11-08 MED ORDER — NEOSTIGMINE METHYLSULFATE 1 MG/ML IJ SOLN
INTRAMUSCULAR | Status: DC | PRN
  Administered 2012-11-08: 5 mg via INTRAVENOUS

## 2012-11-08 MED ORDER — LACTATED RINGERS IV SOLN
INTRAVENOUS | Status: DC
  Administered 2012-11-08 (×3): via INTRAVENOUS

## 2012-11-08 MED ORDER — ENOXAPARIN SODIUM 40 MG/0.4ML ~~LOC~~ SOLN
40.0000 mg | SUBCUTANEOUS | Status: DC
  Administered 2012-11-09 – 2012-11-18 (×10): 40 mg via SUBCUTANEOUS
  Filled 2012-11-08 (×11): qty 0.4

## 2012-11-08 MED ORDER — KCL IN DEXTROSE-NACL 20-5-0.9 MEQ/L-%-% IV SOLN
INTRAVENOUS | Status: AC
  Administered 2012-11-08 – 2012-11-10 (×5): via INTRAVENOUS
  Administered 2012-11-11: 100 mL via INTRAVENOUS
  Administered 2012-11-12 – 2012-11-14 (×5): via INTRAVENOUS
  Filled 2012-11-08 (×17): qty 1000

## 2012-11-08 MED ORDER — MORPHINE SULFATE (PF) 1 MG/ML IV SOLN
INTRAVENOUS | Status: DC
  Administered 2012-11-08: 25 mg via INTRAVENOUS
  Administered 2012-11-08: 18:00:00 via INTRAVENOUS
  Administered 2012-11-08: 15 mg via INTRAVENOUS
  Administered 2012-11-09: 14:00:00 via INTRAVENOUS
  Administered 2012-11-09: 22.5 mg via INTRAVENOUS
  Administered 2012-11-09: 18 mg via INTRAVENOUS
  Administered 2012-11-09: 9 mg via INTRAVENOUS
  Administered 2012-11-09: 4.5 mg via INTRAVENOUS
  Administered 2012-11-09: 25 mg via INTRAVENOUS
  Administered 2012-11-09: 30 mg via INTRAVENOUS
  Administered 2012-11-09: via INTRAVENOUS
  Administered 2012-11-10: 6 mg via INTRAVENOUS
  Administered 2012-11-10: 21 mg via INTRAVENOUS
  Administered 2012-11-10: 16:00:00 via INTRAVENOUS
  Administered 2012-11-10: 25.5 mg via INTRAVENOUS
  Administered 2012-11-10: 12.5 mg via INTRAVENOUS
  Administered 2012-11-10: 1.5 mg via INTRAVENOUS
  Administered 2012-11-11: 6 mg via INTRAVENOUS
  Administered 2012-11-11: 9 mg via INTRAVENOUS
  Administered 2012-11-11: 7.5 mg via INTRAVENOUS
  Administered 2012-11-11: 02:00:00 via INTRAVENOUS
  Administered 2012-11-11: 3 mg via INTRAVENOUS
  Administered 2012-11-11: 8.84 mg via INTRAVENOUS
  Administered 2012-11-11: 12 mg via INTRAVENOUS
  Administered 2012-11-11: 12:00:00 via INTRAVENOUS
  Administered 2012-11-11: 6 mg via INTRAVENOUS
  Administered 2012-11-12: 9 mg via INTRAVENOUS
  Administered 2012-11-12: 12:00:00 via INTRAVENOUS
  Administered 2012-11-12: 9 mg via INTRAVENOUS
  Administered 2012-11-12: 7.5 mg via INTRAVENOUS
  Administered 2012-11-12: 10.5 mg via INTRAVENOUS
  Administered 2012-11-12: 14.84 mg via INTRAVENOUS
  Administered 2012-11-12: 25 mg via INTRAVENOUS
  Administered 2012-11-13: 4.5 mg via INTRAVENOUS
  Administered 2012-11-13: 4.23 mg via INTRAVENOUS
  Administered 2012-11-13: 1.5 mg via INTRAVENOUS
  Administered 2012-11-13: 14:00:00 via INTRAVENOUS
  Administered 2012-11-13 (×2): 6 mg via INTRAVENOUS
  Administered 2012-11-14: 17:00:00 via INTRAVENOUS
  Administered 2012-11-14: 1.5 mg via INTRAVENOUS
  Administered 2012-11-14: 10.5 mg via INTRAVENOUS
  Administered 2012-11-14: 1.5 mg via INTRAVENOUS
  Administered 2012-11-14: 4.5 mg via INTRAVENOUS
  Administered 2012-11-14 (×2): 3 mg via INTRAVENOUS
  Administered 2012-11-15: 4.5 mg via INTRAVENOUS
  Administered 2012-11-15: 3 mg via INTRAVENOUS
  Administered 2012-11-15: 4 mg via INTRAVENOUS
  Administered 2012-11-16 – 2012-11-17 (×2): 1.5 mg via INTRAVENOUS
  Administered 2012-11-17: 17:00:00 via INTRAVENOUS
  Administered 2012-11-17: 3 mg via INTRAVENOUS
  Administered 2012-11-18: 1.5 mg via INTRAVENOUS
  Filled 2012-11-08 (×15): qty 25

## 2012-11-08 MED ORDER — VITAMINS A & D EX OINT
TOPICAL_OINTMENT | CUTANEOUS | Status: AC
  Administered 2012-11-08: 5
  Filled 2012-11-08: qty 5

## 2012-11-08 MED ORDER — PIPERACILLIN-TAZOBACTAM 3.375 G IVPB
3.3750 g | Freq: Three times a day (TID) | INTRAVENOUS | Status: DC
  Administered 2012-11-08 – 2012-11-19 (×32): 3.375 g via INTRAVENOUS
  Filled 2012-11-08 (×37): qty 50

## 2012-11-08 MED ORDER — HYDROMORPHONE HCL PF 1 MG/ML IJ SOLN
0.2500 mg | INTRAMUSCULAR | Status: DC | PRN
  Administered 2012-11-08 (×2): 0.5 mg via INTRAVENOUS

## 2012-11-08 MED ORDER — SIMETHICONE 80 MG PO CHEW
160.0000 mg | CHEWABLE_TABLET | Freq: Once | ORAL | Status: AC
Start: 2012-11-08 — End: 2012-11-08
  Administered 2012-11-08: 160 mg via ORAL
  Filled 2012-11-08: qty 2

## 2012-11-08 MED ORDER — PANTOPRAZOLE SODIUM 40 MG IV SOLR
40.0000 mg | INTRAVENOUS | Status: DC
  Administered 2012-11-08 – 2012-11-18 (×11): 40 mg via INTRAVENOUS
  Filled 2012-11-08 (×12): qty 40

## 2012-11-08 SURGICAL SUPPLY — 49 items
APPLICATOR COTTON TIP 6IN STRL (MISCELLANEOUS) ×2 IMPLANT
BANDAGE GAUZE ELAST BULKY 4 IN (GAUZE/BANDAGES/DRESSINGS) ×2 IMPLANT
BLADE EXTENDED COATED 6.5IN (ELECTRODE) ×2 IMPLANT
BLADE HEX COATED 2.75 (ELECTRODE) ×2 IMPLANT
CANISTER SUCTION 2500CC (MISCELLANEOUS) ×2 IMPLANT
CLOTH BEACON ORANGE TIMEOUT ST (SAFETY) ×2 IMPLANT
COVER MAYO STAND STRL (DRAPES) IMPLANT
DRAPE CAMERA CLOSED 9X96 (DRAPES) ×2 IMPLANT
DRAPE LAPAROSCOPIC ABDOMINAL (DRAPES) ×2 IMPLANT
DRAPE WARM FLUID 44X44 (DRAPE) IMPLANT
ELECT REM PT RETURN 9FT ADLT (ELECTROSURGICAL) ×2
ELECTRODE REM PT RTRN 9FT ADLT (ELECTROSURGICAL) ×1 IMPLANT
GAUZE SPONGE 2X2 8PLY STRL LF (GAUZE/BANDAGES/DRESSINGS) ×1 IMPLANT
GLOVE BIO SURGEON STRL SZ7.5 (GLOVE) ×4 IMPLANT
GLOVE BIOGEL PI IND STRL 7.0 (GLOVE) ×1 IMPLANT
GLOVE BIOGEL PI INDICATOR 7.0 (GLOVE) ×1
GLOVE EUDERMIC 7 POWDERFREE (GLOVE) ×2 IMPLANT
GOWN PREVENTION PLUS XLARGE (GOWN DISPOSABLE) ×2 IMPLANT
GOWN STRL NON-REIN LRG LVL3 (GOWN DISPOSABLE) ×2 IMPLANT
GOWN STRL REIN XL XLG (GOWN DISPOSABLE) ×2 IMPLANT
KIT BASIN OR (CUSTOM PROCEDURE TRAY) ×2 IMPLANT
LIGASURE IMPACT 36 18CM CVD LR (INSTRUMENTS) ×2 IMPLANT
NS IRRIG 1000ML POUR BTL (IV SOLUTION) ×2 IMPLANT
PACK GENERAL/GYN (CUSTOM PROCEDURE TRAY) ×2 IMPLANT
RELOAD PROXIMATE 75MM BLUE (ENDOMECHANICALS) ×4 IMPLANT
SET IRRIG TUBING LAPAROSCOPIC (IRRIGATION / IRRIGATOR) ×2 IMPLANT
SPONGE GAUZE 2X2 STER 10/PKG (GAUZE/BANDAGES/DRESSINGS) ×1
SPONGE GAUZE 4X4 12PLY (GAUZE/BANDAGES/DRESSINGS) ×2 IMPLANT
SPONGE LAP 18X18 X RAY DECT (DISPOSABLE) ×4 IMPLANT
STAPLER GUN LINEAR PROX 60 (STAPLE) ×2 IMPLANT
STAPLER PROXIMATE 75MM BLUE (STAPLE) ×2 IMPLANT
STAPLER VISISTAT 35W (STAPLE) ×2 IMPLANT
SUCTION POOLE TIP (SUCTIONS) ×2 IMPLANT
SUT PDS AB 1 CTX 36 (SUTURE) ×4 IMPLANT
SUT SILK 2 0 (SUTURE) ×1
SUT SILK 2 0 SH CR/8 (SUTURE) IMPLANT
SUT SILK 2 0SH CR/8 30 (SUTURE) ×2 IMPLANT
SUT SILK 2-0 18XBRD TIE 12 (SUTURE) ×1 IMPLANT
SUT SILK 3 0 (SUTURE)
SUT SILK 3 0 SH CR/8 (SUTURE) ×2 IMPLANT
SUT SILK 3-0 18XBRD TIE 12 (SUTURE) IMPLANT
SWAB COLLECTION DEVICE MRSA (MISCELLANEOUS) ×2 IMPLANT
TOWEL OR 17X26 10 PK STRL BLUE (TOWEL DISPOSABLE) ×4 IMPLANT
TOWEL OR NON WOVEN STRL DISP B (DISPOSABLE) ×2 IMPLANT
TRAY FOLEY CATH 14FRSI W/METER (CATHETERS) IMPLANT
TROCAR BLADELESS OPT 5 75 (ENDOMECHANICALS) ×4 IMPLANT
TUBE ANAEROBIC SPECIMEN COL (MISCELLANEOUS) ×2 IMPLANT
WATER STERILE IRR 1500ML POUR (IV SOLUTION) ×2 IMPLANT
YANKAUER SUCT BULB TIP NO VENT (SUCTIONS) ×2 IMPLANT

## 2012-11-08 NOTE — H&P (Signed)
[name] [name] is an 32 y.o. female. G3P3  Who presented this AM with an acute onset of abdominal pain, initially epigastric but then spread throughout abdomen.  Pt is s/p LTCS 5 days ago on 11/03/12 which was uneventful for a breech presentation.  On post-operative day 2 she had some increased pain and flat and upright films were done showing colonic dilation, but no small bowel issues.  She improved and began to pass flatus and went home on 11/06/12 and stated was doing reasonably well with no fever, chills, nausea or vomiting.  She had a loose BM yesterday after using a laxative.  She got up to feed the baby this AM and had a sudden onset of sharp pain that took her breath, did not improve with po meds and was advised to come to hospital.  She also has known gallstones, and was planning to have a cholecystectomy following pregnancy.  She is currently breastfeeding.   OB History: G3P3 NSVD x 2 C/S x 1   Menstrual History:  Patient's last menstrual period was 02/14/2012.    Past Medical History  Diagnosis Date  . Gallstones   . Cholecystitis chronic, acute   . Heart murmur     no indications for 7-25yrs per pt.  . Asthma     childhood exercise induced only  . Headache(784.0)     migraines    Past Surgical History  Procedure Laterality Date  . No past surgeries    . Cesarean section N/A 11/03/2012    Procedure: PRIMARY CESAREAN SECTION;  Surgeon: Bing Plume, MD;  Location: [facility];  Service: Obstetrics;  Laterality: N/A;    Family History  Problem Relation Age of Onset  . ADD / ADHD      family hx  . Breast cancer      fhx  . Mental illness      fhx  . Heart disease      fhx  . Hypertension      fhx    Social History:  reports that she has never smoked. She does not have any smokeless tobacco history on file. She reports that she does not drink alcohol or use illicit drugs.  Allergies: No Known Allergies  Prescriptions prior to admission  Medication Sig Dispense Refill   . bisacodyl (DULCOLAX) 5 MG EC tablet Take 10 mg by mouth daily as needed for constipation.      Marland Kitchen ibuprofen (ADVIL,MOTRIN) 600 MG tablet Take 1 tablet (600 mg total) by mouth every 6 (six) hours as needed for pain.  30 tablet  0  . omeprazole (PRILOSEC) 40 MG capsule Take 40 mg by mouth daily.      Marland Kitchen oxyCODONE-acetaminophen (PERCOCET/ROXICET) 5-325 MG per tablet Take 1 tablet by mouth every 6 (six) hours as needed for pain.  30 tablet  0    Review of Systems  Constitutional: Negative for fever.  Gastrointestinal: Positive for abdominal pain and diarrhea. Negative for vomiting.    Blood pressure 123/58, pulse 100, temperature 97.5 F (36.4 C), resp. rate 18, last menstrual period 02/14/2012. Physical Exam  Constitutional: She is oriented to person, place, and time. She appears well-developed and well-nourished.  Cardiovascular: Normal rate and regular rhythm.   Respiratory: Effort normal and breath sounds normal.  GI: She exhibits distension. There is tenderness. There is guarding.  Genitourinary: Vagina normal.  Uterus difficult to palpate with distention  Neurological: She is alert and oriented to person, place, and time.  Psychiatric: Her behavior is  normal.    Results for orders placed during the hospital encounter of 11/08/12 (from the past 24 hour(s))  CBC     Status: Abnormal   Collection Time    11/08/12  8:34 AM      Result Value Range   WBC 8.5  4.0 - 10.5 K/uL   RBC 3.28 (*) 3.87 - 5.11 MIL/uL   Hemoglobin 9.8 (*) 12.0 - 15.0 g/dL   HCT 29.5 (*) 62.1 - 30.8 %   MCV 90.9  78.0 - 100.0 fL   MCH 29.9  26.0 - 34.0 pg   MCHC 32.9  30.0 - 36.0 g/dL   RDW 65.7  84.6 - 96.2 %   Platelets 277  150 - 400 K/uL  COMPREHENSIVE METABOLIC PANEL     Status: Abnormal   Collection Time    11/08/12  8:34 AM      Result Value Range   Sodium 141  135 - 145 mEq/L   Potassium 3.8  3.5 - 5.1 mEq/L   Chloride 108  96 - 112 mEq/L   CO2 25  19 - 32 mEq/L   Glucose, Bld 95  70 - 99  mg/dL   BUN 7  6 - 23 mg/dL   Creatinine, Ser 9.52  0.50 - 1.10 mg/dL   Calcium 8.8  8.4 - 84.1 mg/dL   Total Protein 5.7 (*) 6.0 - 8.3 g/dL   Albumin 2.4 (*) 3.5 - 5.2 g/dL   AST 14  0 - 37 U/L   ALT 14  0 - 35 U/L   Alkaline Phosphatase 113  39 - 117 U/L   Total Bilirubin 0.3  0.3 - 1.2 mg/dL   GFR calc non Af Amer >90  >90 mL/min   GFR calc Af Amer >90  >90 mL/min    Dg Abd 2 Views  11/08/2012   *RADIOLOGY REPORT*  Clinical Data: Postop C-section on 11/03/2012.  Abdominal pain. Reevaluate ileus.  The  ABDOMEN - 2 VIEW  Comparison:  Findings: There is marked gaseous distension of colonic loops, increased since previous exam.  Beneath the hemidiaphragms, there is lucency, consistent with free intraperitoneal air.  The free air is a new finding since prior studies and not felt to be related to postoperative changes.  Surgical clips overlie the lower abdomen.  IMPRESSION:  1.  Increased gaseous distension of the colon. 2.  Evidence for bowel perforation.  Critical test results telephoned to Georges Mouse at the time of interpretation on date 11/08/2012 at time 9:20 a.m.   Original Report Authenticated By: Norva Pavlov, M.D.    Assessment/Plan: D/w pt and her mother the new finding of free air under her diaphragm with the concern for bowel perforation.  I consulted Dr. Carolynne Edouard of general surgery who will come to see the patient and do an exploratory surgery to determine where the perforation is located.  The patient is currently resting reasonably comfortably, was given toradol x 1 and states that helped her pain. We are awaiting Dr. Billey Chang input for where to proceed surgically.  Pt may need to pump soon.  Oliver Pila 11/08/2012, 9:58 AM

## 2012-11-08 NOTE — Anesthesia Postprocedure Evaluation (Signed)
  Anesthesia Post-op Note  Patient: [name] [name]  Procedure(s) Performed: Procedure(s) (LRB): laparoscopic exploration and EXPLORATORY LAPAROTOMY (N/A)  Patient Location: PACU  Anesthesia Type: General  Level of Consciousness: awake and alert   Airway and Oxygen Therapy: Patient Spontanous Breathing  Post-op Pain: mild  Post-op Assessment: Post-op Vital signs reviewed, Patient's Cardiovascular Status Stable, Respiratory Function Stable, Patent Airway and No signs of Nausea or vomiting  Last Vitals:  Filed Vitals:   11/08/12 1459  BP:   Pulse:   Temp:   Resp: 19    Post-op Vital Signs: stable   Complications: No apparent anesthesia complications. To stepdown unit per plan.

## 2012-11-08 NOTE — Progress Notes (Signed)
Patient ID: [name] [name], female   DOB: April 30, 1981, 32 y.o.   MRN: [id number] Dr. Senaida Ores informed me of the pt's status and I talked to Dr. Carolynne Edouard. I visited the pt, her mother, husband and daughter and expressed my desire that she will recover rapidly

## 2012-11-08 NOTE — Progress Notes (Signed)
ANTIBIOTIC CONSULT NOTE - INITIAL  Pharmacy Consult for Zosyn Indication: Perforated bowel/colectomy  No Known Allergies  Patient Measurements:   TBW 117 kg  Vital Signs: Temp: 98.8 F (37.1 C) (06/07 1430) BP: 131/75 mmHg (06/07 1445) Pulse Rate: 79 (06/07 1445) Intake/Output from previous day:   Intake/Output from this shift: Total I/O In: 3000 [I.V.:3000] Out: 325 [Urine:200; Blood:125]  Labs:  Recent Labs  11/08/12 0834  WBC 8.5  HGB 9.8*  PLT 277  CREATININE 0.61   The CrCl is unknown because both a height and weight (above a minimum accepted value) are required for this calculation. No results found for this basename: VANCOTROUGH, Leodis Binet, VANCORANDOM, GENTTROUGH, GENTPEAK, GENTRANDOM, TOBRATROUGH, TOBRAPEAK, TOBRARND, AMIKACINPEAK, AMIKACINTROU, AMIKACIN,  in the last 72 hours   Microbiology: Recent Results (from the past 720 hour(s))  GRAM STAIN     Status: None   Collection Time    11/08/12 12:17 PM      Result Value Range Status   Specimen Description PERITONEAL   Final   Special Requests NONE   Final   Gram Stain     Final   Value: ABUNDANT WBC PRESENT, PREDOMINANTLY PMN     NO ORGANISMS SEEN     Gram Stain Report Called to,Read Back By and Verified With: DR Carolynne Edouard [id number] @ 1355 BY J SCOTTON   Report Status 11/08/2012 FINAL   Final   Medical History: Past Medical History  Diagnosis Date  . Gallstones   . Cholecystitis chronic, acute   . Heart murmur     no indications for 7-66yrs per pt.  . Asthma     childhood exercise induced only  . Headache(784.0)     migraines   Medications:  Scheduled:  . [START ON 11/09/2012] enoxaparin (LOVENOX) injection  40 mg Subcutaneous Q24H  . HYDROmorphone      . morphine   Intravenous Q4H  . pantoprazole (PROTONIX) IV  40 mg Intravenous Q24H  . piperacillin-tazobactam (ZOSYN)  IV  3.375 g Intravenous Q8H   Anti-infectives   Start     Dose/Rate Route Frequency Ordered Stop   11/08/12 1030   piperacillin-tazobactam (ZOSYN) IVPB 3.375 g     3.375 g 12.5 mL/hr over 240 Minutes Intravenous 3 times per day 11/08/12 1025       Assessment: 31yoF s/p expl lap for perforated bowel, colectomy. C section 6/2, some abd pain, colonic dilatation on abd Xray, home 6/5. Onset of sharp pain this am, recommendation to ED, hx of gallstones and planned cholecystectomy after pregnancy. Pt is breast-feeding.  Expl lap 6/7, Zosyn post-op  Renal fx wnl  Impact of medications on breast feeding: -Zosyn can be found in breast milk, no direct adverse effect, but could alter gut flora of the baby -On Morphine PCA: suggest pump and dump breast milk while on Morphine, or other opiates.  Goal of Therapy:  Abx dose/schedule appropriate for therapy  Plan:  No change to Zosyn 3.375gm q8h-4hr infusion  Otho Bellows PharmD Pager [phone] 11/08/2012, 3:30 PM

## 2012-11-08 NOTE — Transfer of Care (Signed)
Immediate Anesthesia Transfer of Care Note  Patient: [name] [name]  Procedure(s) Performed: Procedure(s): laparoscopic exploration and EXPLORATORY LAPAROTOMY (N/A)  Patient Location: PACU  Anesthesia Type:General  Level of Consciousness: awake, alert , oriented and patient cooperative  Airway & Oxygen Therapy: Patient Spontanous Breathing and Patient connected to face mask oxygen  Post-op Assessment: Report given to PACU RN and Post -op Vital signs reviewed and stable  Post vital signs: Reviewed and stable  Complications: No apparent anesthesia complications

## 2012-11-08 NOTE — MAU Provider Note (Signed)
History     CSN: [id number]  Arrival date and time: 11/08/12 4010   First Provider Initiated Contact with Patient 11/08/12 0818      Chief Complaint  Patient presents with  . Abdominal Pain   HPI 32 y.o. G3P1003 at 5 days s/p PLTCS for breech. Pt reports sudden onset of generalized abdominal pain at around 3:30 this morning. Pain is constant and encompasses entire abdomen above incision. States pain is not incisional. Last BM yesterday. + nausea, no vomiting. When asked about flatus, pt states she was passing gas before the pain started. Taking pain meds as prescribed, last percocet at 7 AM. On POD#2, pt had complained of pain - abd x-ray showed colonic ileus with no small bowel dilation. Started passing gas after that, had relief of pain, d/c'd to home.   Past Medical History  Diagnosis Date  . Gallstones   . Cholecystitis chronic, acute   . Heart murmur     no indications for 7-51yrs per pt.  . Asthma     childhood exercise induced only  . Headache(784.0)     migraines    Past Surgical History  Procedure Laterality Date  . No past surgeries    . Cesarean section N/A 11/03/2012    Procedure: PRIMARY CESAREAN SECTION;  Surgeon: Bing Plume, MD;  Location: [facility];  Service: Obstetrics;  Laterality: N/A;    Family History  Problem Relation Age of Onset  . ADD / ADHD      family hx  . Breast cancer      fhx  . Mental illness      fhx  . Heart disease      fhx  . Hypertension      fhx    History  Substance Use Topics  . Smoking status: Never Smoker   . Smokeless tobacco: Not on file  . Alcohol Use: No    Allergies: No Known Allergies  Prescriptions prior to admission  Medication Sig Dispense Refill  . bisacodyl (DULCOLAX) 5 MG EC tablet Take 10 mg by mouth daily as needed for constipation.      Marland Kitchen ibuprofen (ADVIL,MOTRIN) 600 MG tablet Take 1 tablet (600 mg total) by mouth every 6 (six) hours as needed for pain.  30 tablet  0  . omeprazole (PRILOSEC) 40 MG  capsule Take 40 mg by mouth daily.      Marland Kitchen oxyCODONE-acetaminophen (PERCOCET/ROXICET) 5-325 MG per tablet Take 1 tablet by mouth every 6 (six) hours as needed for pain.  30 tablet  0    Review of Systems  Constitutional: Negative.  Negative for fever.  Respiratory: Negative.   Cardiovascular: Negative.   Gastrointestinal: Positive for nausea and abdominal pain. Negative for vomiting, diarrhea and constipation.  Genitourinary: Negative for dysuria, urgency, frequency, hematuria and flank pain.  Musculoskeletal: Negative.   Neurological: Negative.   Psychiatric/Behavioral: Negative.    Physical Exam   Blood pressure 123/58, pulse 100, temperature 97.5 F (36.4 C), resp. rate 18, last menstrual period 02/14/2012.  Physical Exam  Nursing note and vitals reviewed. Constitutional: She is oriented to person, place, and time. She appears well-developed and well-nourished. She appears distressed.  Cardiovascular: Normal rate.   Respiratory: Effort normal.  GI: Soft. Bowel sounds are normal. She exhibits distension. She exhibits no mass. There is tenderness. There is guarding. There is no rebound.  Musculoskeletal: Normal range of motion.  Neurological: She is alert and oriented to person, place, and time.  Skin: Skin is warm  and dry.  Psychiatric: She has a normal mood and affect.    MAU Course  Procedures Results for orders placed during the hospital encounter of 11/08/12 (from the past 24 hour(s))  CBC     Status: Abnormal   Collection Time    11/08/12  8:34 AM      Result Value Range   WBC 8.5  4.0 - 10.5 K/uL   RBC 3.28 (*) 3.87 - 5.11 MIL/uL   Hemoglobin 9.8 (*) 12.0 - 15.0 g/dL   HCT 47.8 (*) 29.5 - 62.1 %   MCV 90.9  78.0 - 100.0 fL   MCH 29.9  26.0 - 34.0 pg   MCHC 32.9  30.0 - 36.0 g/dL   RDW 30.8  65.7 - 84.6 %   Platelets 277  150 - 400 K/uL  COMPREHENSIVE METABOLIC PANEL     Status: Abnormal   Collection Time    11/08/12  8:34 AM      Result Value Range   Sodium  141  135 - 145 mEq/L   Potassium 3.8  3.5 - 5.1 mEq/L   Chloride 108  96 - 112 mEq/L   CO2 25  19 - 32 mEq/L   Glucose, Bld 95  70 - 99 mg/dL   BUN 7  6 - 23 mg/dL   Creatinine, Ser 9.62  0.50 - 1.10 mg/dL   Calcium 8.8  8.4 - 95.2 mg/dL   Total Protein 5.7 (*) 6.0 - 8.3 g/dL   Albumin 2.4 (*) 3.5 - 5.2 g/dL   AST 14  0 - 37 U/L   ALT 14  0 - 35 U/L   Alkaline Phosphatase 113  39 - 117 U/L   Total Bilirubin 0.3  0.3 - 1.2 mg/dL   GFR calc non Af Amer >90  >90 mL/min   GFR calc Af Amer >90  >90 mL/min   Dg Abd 2 Views  11/08/2012   *RADIOLOGY REPORT*  Clinical Data: Postop C-section on 11/03/2012.  Abdominal pain. Reevaluate ileus.  The  ABDOMEN - 2 VIEW  Comparison:  Findings: There is marked gaseous distension of colonic loops, increased since previous exam.  Beneath the hemidiaphragms, there is lucency, consistent with free intraperitoneal air.  The free air is a new finding since prior studies and not felt to be related to postoperative changes.  Surgical clips overlie the lower abdomen.  IMPRESSION:  1.  Increased gaseous distension of the colon. 2.  Evidence for bowel perforation.  Critical test results telephoned to Georges Mouse at the time of interpretation on date 11/08/2012 at time 9:20 a.m.   Original Report Authenticated By: Norva Pavlov, M.D.   Dg Abd 2 Views  11/05/2012   *RADIOLOGY REPORT*  Clinical Data: Right-sided abdominal pain.  2 days postop from C- section.  ABDOMEN - 2 VIEW  Comparison: None.  Findings: Lower transverse incision skin staples seen in the suprapubic region.  Mild pubic symphysis dye stasis noted.  There is diffuse colonic dilatation with multiple air-fluid levels, consistent with colonic ileus.  No evidence of dilated small bowel loops.  No evidence of free air.  No radiopaque calculi identified.  IMPRESSION:  1.  Colonic ileus pattern. 2.  Mild postpartum pubic symphysis diastasis noted.   Original Report Authenticated By: Myles Rosenthal, M.D.     Assessment and Plan  32 y.o. G3P1003 on POD#5 with evidence for bowel perforation on x-ray Dr. Senaida Ores informed IV initiated, Type and Screen ordered, NPO now - had sip of  water with percocet at 7 AM  [name] [name] 11/08/2012, 8:26 AM

## 2012-11-08 NOTE — Anesthesia Preprocedure Evaluation (Signed)
Anesthesia Evaluation  Patient identified by MRN, date of birth, ID band Patient awake    Reviewed: Allergy & Precautions, H&P , NPO status , Patient's Chart, lab work & pertinent test results  Airway Mallampati: II TM Distance: >3 FB Neck ROM: Full    Dental no notable dental hx.    Pulmonary asthma ,  breath sounds clear to auscultation  Pulmonary exam normal       Cardiovascular Exercise Tolerance: Good negative cardio ROS  + Valvular Problems/Murmurs Rhythm:Regular Rate:Normal     Neuro/Psych  Headaches, negative psych ROS   GI/Hepatic negative GI ROS, Neg liver ROS,   Endo/Other  Morbid obesity  Renal/GU negative Renal ROS  negative genitourinary   Musculoskeletal negative musculoskeletal ROS (+)   Abdominal (+) + obese,   Peds negative pediatric ROS (+)  Hematology negative hematology ROS (+)   Anesthesia Other Findings   Reproductive/Obstetrics negative OB ROS                           Anesthesia Physical Anesthesia Plan  ASA: II and emergent  Anesthesia Plan: General   Post-op Pain Management:    Induction: Intravenous  Airway Management Planned: Oral ETT  Additional Equipment:   Intra-op Plan:   Post-operative Plan: Extubation in OR  Informed Consent: I have reviewed the patients History and Physical, chart, labs and discussed the procedure including the risks, benefits and alternatives for the proposed anesthesia with the patient or authorized representative who has indicated his/her understanding and acceptance.   Dental advisory given  Plan Discussed with: CRNA  Anesthesia Plan Comments:         Anesthesia Quick Evaluation

## 2012-11-08 NOTE — Op Note (Signed)
11/08/2012  1:26 PM  PATIENT:  [name] [name]  32 y.o. female  PRE-OPERATIVE DIAGNOSIS:  PERFORATED BOWEL  POST-OPERATIVE DIAGNOSIS:  Perforated Cecum  PROCEDURE:  Procedure(s): EXPLORATORY LAPAROTOMY (N/A) Right Colectomy  SURGEON:  Surgeon(s) and Role:    * Robyne Askew, MD - Primary    * Currie Paris, MD - Assisting  PHYSICIAN ASSISTANT:   ASSISTANTS: Dr. Jamey Ripa   ANESTHESIA:   general  EBL:  Total I/O In: 2000 [I.V.:2000] Out: -   BLOOD ADMINISTERED:none  DRAINS: none   LOCAL MEDICATIONS USED:  MARCAINE     SPECIMEN:  Source of Specimen:  right colon with terminal ileum  DISPOSITION OF SPECIMEN:  PATHOLOGY  COUNTS:  YES  TOURNIQUET:  * No tourniquets in log *  DICTATION: .Dragon Dictation After informed consent was obtained the patient was brought to the operating room and placed in the supine position on the operating room table. After adequate induction of general anesthesia the patient's AB was prepped with Betadine and draped in usual sterile manner. Initially a 5 mm Optiview port was used to bluntly dissect through the layers of the abdominal wall in the right upper quadrant until access was gained to the abdominal cavity. The abdomen was then insufflated with carbon dioxide without difficulty. Another 5 mm port was placed under direct vision in the right middle abdomen. There was pus identified in the abdominal cavity. There was no obvious perforation of the small intestine. The cecum appeared to be in distended from an ileus and a tear was identified on the anterior wall. At this point a midline incision was made with a 10 blade knife. This incision was carried through the skin and subcutaneous tissue sharply with electrocautery until the linea alba was identified. The linea alba was incised with the electrocautery. The preperitoneal space was probed bluntly with a hemostat the peritoneum was opened and access was gained to the abdominal cavity. The rest  of the incision was opened under direct vision. Cultures were obtained of the purulent fluid. The small bowel was run from the ligament of Treitz to the ileocecal bowel and no evidence of perforation was identified. The transverse left and sigmoid colon was evaluated and no area of obstruction was identified. The cecum was very large and distended and there was a muscular tear on the anterior surface. This was the site of the perforation. The right colon was then mobilized by incising its retroperitoneal attachment along the white line of Toldt. This allows to bring the right colon up into the wound. A site was chosen on the terminal ileum to divide the small bowel. The mesentery at this point was opened sharply with the electrocautery. A GIA 75 stapler was then placed across the small bowel this point, clamped and fired thereby dividing the small bowel between staple lines. The site was also chosen on the proximal transverse colon for division of the colon. The mesentery at this point was opened sharply with the electrocautery. A GI 75 stapler was placed across the colon at this point, clamped and fired thereby dividing the colon between staple lines. The mesentery to the right colon was then taken down sharply with the LigaSure. The main vessels were then clamped with a Kelly clamp and ligated with 2-0 silk ties. Once this was accomplished the right colon was removed and sent to pathology for further evaluation. The adenoids and irrigated with copious amounts of saline until the effluent was clear. The wound was draped with sterile  towels. The small bowel and transverse colon approximated each other easily. The 2 segments of bowel were held in place with a 2-0 silk stitch. A small enterotomy was made on the antimesenteric border of each limb of the small bowel and colon. Each limb of the GIA 75 stapler was then placed down the appropriate limb of small bowel or colon, clamped and fired thereby creating a nice  widely patent enteroenterostomy. The common enterotomy was closed with a TA 60 stapler. The staple line was then dunked with interrupted 2-0 silk Lembert stitches. A 2-0 silk crotch stitch was also placed. The mesenteric defect was also closed with interrupted 2-0 silk stitches. Once this was accomplished the anastomosis looked healthy and widely patent. The anastomosis was placed back in the abdominal cavity. The operative bed was examined and found to be hemostatic. The eye was irrigated again with copious amounts of saline. No other abnormalities were noted on general inspection of the abdomen. The fascia of the anterior bowel wall was then closed with 2 running #1 double-stranded looped PDS sutures. The subcutaneous tissue was left open because of the amount of contamination and packed with moistened Kerlix gauze. Sterile dressings were applied. The patient tolerated the procedure well. At the end of the case all needle sponge and instrument counts were correct. The patient was then awakened and taken to recovery in stable condition.  PLAN OF CARE: Admit to inpatient   PATIENT DISPOSITION:  PACU - hemodynamically stable.   Delay start of Pharmacological VTE agent (>24hrs) due to surgical blood loss or risk of bleeding: yes

## 2012-11-08 NOTE — MAU Note (Signed)
Report given to the operating room at [facility].

## 2012-11-08 NOTE — Anesthesia Procedure Notes (Addendum)
Procedure Name: Intubation Date/Time: 11/08/2012 11:34 AM Performed by: Hulan Fess Pre-anesthesia Checklist: Patient identified, Emergency Drugs available, Suction available, Patient being monitored and Timeout performed Patient Re-evaluated:Patient Re-evaluated prior to inductionOxygen Delivery Method: Circle system utilized Preoxygenation: Pre-oxygenation with 100% oxygen Intubation Type: IV induction Ventilation: Mask ventilation without difficulty Laryngoscope Size: Mac and 3 Grade View: Grade I Tube size: 8.0 mm Number of attempts: 1 Placement Confirmation: ETT inserted through vocal cords under direct vision Secured at: 22.5 cm Tube secured with: Tape Dental Injury: Teeth and Oropharynx as per pre-operative assessment     Date/Time: 11/08/2012 11:34 AM Performed by: Shelby Dubin K Intubation Type: Cricoid Pressure applied and Rapid sequence

## 2012-11-08 NOTE — H&P (Signed)
[name] [name] is an 32 y.o. female.   Chief Complaint: abdominal pain HPI: The pt is a 32 yo wf who is 5 days s/p c section. She was doing well until about 3 am this morning when she developed severe abdominal pain. No fever. No vomiting. She came to ER and xrays showed significant free air that was not there 3 days ago.  Past Medical History  Diagnosis Date  . Gallstones   . Cholecystitis chronic, acute   . Heart murmur     no indications for 7-19yrs per pt.  . Asthma     childhood exercise induced only  . Headache(784.0)     migraines    Past Surgical History  Procedure Laterality Date  . No past surgeries    . Cesarean section N/A 11/03/2012    Procedure: PRIMARY CESAREAN SECTION;  Surgeon: Bing Plume, MD;  Location: [facility];  Service: Obstetrics;  Laterality: N/A;    Family History  Problem Relation Age of Onset  . ADD / ADHD      family hx  . Breast cancer      fhx  . Mental illness      fhx  . Heart disease      fhx  . Hypertension      fhx   Social History:  reports that she has never smoked. She does not have any smokeless tobacco history on file. She reports that she does not drink alcohol or use illicit drugs.  Allergies: No Known Allergies  Medications Prior to Admission  Medication Sig Dispense Refill  . bisacodyl (DULCOLAX) 5 MG EC tablet Take 10 mg by mouth daily as needed for constipation.      Marland Kitchen ibuprofen (ADVIL,MOTRIN) 600 MG tablet Take 1 tablet (600 mg total) by mouth every 6 (six) hours as needed for pain.  30 tablet  0  . omeprazole (PRILOSEC) 40 MG capsule Take 40 mg by mouth daily.      Marland Kitchen oxyCODONE-acetaminophen (PERCOCET/ROXICET) 5-325 MG per tablet Take 1 tablet by mouth every 6 (six) hours as needed for pain.  30 tablet  0    Results for orders placed during the hospital encounter of 11/08/12 (from the past 48 hour(s))  CBC     Status: Abnormal   Collection Time    11/08/12  8:34 AM      Result Value Range   WBC 8.5  4.0 - 10.5 K/uL   RBC 3.28 (*) 3.87 - 5.11 MIL/uL   Hemoglobin 9.8 (*) 12.0 - 15.0 g/dL   HCT 16.1 (*) 09.6 - 04.5 %   MCV 90.9  78.0 - 100.0 fL   MCH 29.9  26.0 - 34.0 pg   MCHC 32.9  30.0 - 36.0 g/dL   RDW 40.9  81.1 - 91.4 %   Platelets 277  150 - 400 K/uL  COMPREHENSIVE METABOLIC PANEL     Status: Abnormal   Collection Time    11/08/12  8:34 AM      Result Value Range   Sodium 141  135 - 145 mEq/L   Potassium 3.8  3.5 - 5.1 mEq/L   Chloride 108  96 - 112 mEq/L   CO2 25  19 - 32 mEq/L   Glucose, Bld 95  70 - 99 mg/dL   BUN 7  6 - 23 mg/dL   Creatinine, Ser 7.82  0.50 - 1.10 mg/dL   Calcium 8.8  8.4 - 95.6 mg/dL   Total Protein 5.7 (*) 6.0 -  8.3 g/dL   Albumin 2.4 (*) 3.5 - 5.2 g/dL   AST 14  0 - 37 U/L   ALT 14  0 - 35 U/L   Alkaline Phosphatase 113  39 - 117 U/L   Total Bilirubin 0.3  0.3 - 1.2 mg/dL   GFR calc non Af Amer >90  >90 mL/min   GFR calc Af Amer >90  >90 mL/min   Comment:            The eGFR has been calculated     using the CKD EPI equation.     This calculation has not been     validated in all clinical     situations.     eGFR's persistently     <90 mL/min signify     possible Chronic Kidney Disease.   Dg Abd 2 Views  11/08/2012   *RADIOLOGY REPORT*  Clinical Data: Postop C-section on 11/03/2012.  Abdominal pain. Reevaluate ileus.  The  ABDOMEN - 2 VIEW  Comparison:  Findings: There is marked gaseous distension of colonic loops, increased since previous exam.  Beneath the hemidiaphragms, there is lucency, consistent with free intraperitoneal air.  The free air is a new finding since prior studies and not felt to be related to postoperative changes.  Surgical clips overlie the lower abdomen.  IMPRESSION:  1.  Increased gaseous distension of the colon. 2.  Evidence for bowel perforation.  Critical test results telephoned to Georges Mouse at the time of interpretation on date 11/08/2012 at time 9:20 a.m.   Original Report Authenticated By: Norva Pavlov, M.D.    Review of  Systems  Constitutional: Negative.   HENT: Negative.   Eyes: Negative.   Respiratory: Negative.   Cardiovascular: Negative.   Gastrointestinal: Positive for abdominal pain.  Genitourinary: Negative.   Musculoskeletal: Negative.   Skin: Negative.   Neurological: Negative.   Endo/Heme/Allergies: Negative.   Psychiatric/Behavioral: Negative.     Blood pressure 111/69, pulse 97, temperature 97.5 F (36.4 C), resp. rate 18, last menstrual period 02/14/2012. Physical Exam  Constitutional: She is oriented to person, place, and time. She appears well-developed and well-nourished.  HENT:  Head: Normocephalic and atraumatic.  Eyes: Conjunctivae and EOM are normal. Pupils are equal, round, and reactive to light.  Neck: Normal range of motion. Neck supple.  Cardiovascular: Normal rate, regular rhythm and normal heart sounds.   Respiratory: Effort normal and breath sounds normal.  GI: Soft. Bowel sounds are normal.  Moderate to severe diffuse tenderness. No rigidity  Musculoskeletal: Normal range of motion.  Neurological: She is alert and oriented to person, place, and time.  Skin: Skin is warm and dry.  Psychiatric: She has a normal mood and affect. Her behavior is normal.     Assessment/Plan The pt is 5 days s/p c section with what appears to be a bowel perforation. Because of the risk of sepsis she will need urgent exploration. I have discussed with her the risks and benefits of surgery as well as some of the technical aspects including the possibility of needing an ostomy and she understands and wishes to proceed. Will transfer her to [facility] for surgery TOTH III,PAUL S 11/08/2012, 10:27 AM

## 2012-11-08 NOTE — MAU Note (Signed)
Pt presents with complaints of abdominal pain that started around 330 this morning. Pt states pain is not at her incision site just cramping with sharp pains in her abdomen. Delivered by cesarean section on Monday, June the 2nd

## 2012-11-09 LAB — CBC
MCH: 29.5 pg (ref 26.0–34.0)
MCV: 90.2 fL (ref 78.0–100.0)
Platelets: 264 10*3/uL (ref 150–400)
RBC: 3.05 MIL/uL — ABNORMAL LOW (ref 3.87–5.11)

## 2012-11-09 LAB — BASIC METABOLIC PANEL
CO2: 26 mEq/L (ref 19–32)
Calcium: 7.9 mg/dL — ABNORMAL LOW (ref 8.4–10.5)
Creatinine, Ser: 0.59 mg/dL (ref 0.50–1.10)

## 2012-11-09 NOTE — Progress Notes (Signed)
1 Day Post-Op  Subjective: No complaints other than some intermittent soreness  Objective: Vital signs in last 24 hours: Temp:  [96.8 F (36 C)-99.1 F (37.3 C)] 98.6 F (37 C) (06/08 0400) Pulse Rate:  [71-100] 89 11/30/2022 1933) Resp:  [15-30] 24 (06/08 0332) BP: (108-131)/(55-81) 126/81 mmHg 30-Nov-2022 1933) SpO2:  [93 %-100 %] 97 % (06/08 0332) FiO2 (%):  [21 %] 21 % (06/08 0332)    Intake/Output from previous day: 2022/11/30 0701 - 06/08 0700 In: 3400 [I.V.:3400] Out: 725 [Urine:600; Blood:125] Intake/Output this shift:    GI: soft, mild tenderness. wound clean  Lab Results:   Recent Labs  29-Nov-2012 1540 11/09/12 0346  WBC 8.0 7.3  HGB 8.9* 9.0*  HCT 26.4* 27.5*  PLT 222 264   BMET  Recent Labs  11/29/2012 0834 11-29-12 1540 11/09/12 0346  NA 141  --  138  K 3.8  --  3.7  CL 108  --  107  CO2 25  --  26  GLUCOSE 95  --  99  BUN 7  --  4*  CREATININE 0.61 0.44* 0.59  CALCIUM 8.8  --  7.9*   PT/INR No results found for this basename: LABPROT, INR,  in the last 72 hours ABG No results found for this basename: PHART, PCO2, PO2, HCO3,  in the last 72 hours  Studies/Results: Dg Abd 2 Views  November 29, 2012   *RADIOLOGY REPORT*  Clinical Data: Postop C-section on 11/03/2012.  Abdominal pain. Reevaluate ileus.  The  ABDOMEN - 2 VIEW  Comparison:  Findings: There is marked gaseous distension of colonic loops, increased since previous exam.  Beneath the hemidiaphragms, there is lucency, consistent with free intraperitoneal air.  The free air is a new finding since prior studies and not felt to be related to postoperative changes.  Surgical clips overlie the lower abdomen.  IMPRESSION:  1.  Increased gaseous distension of the colon. 2.  Evidence for bowel perforation.  Critical test results telephoned to Georges Mouse at the time of interpretation on date 11-29-2012 at time 9:20 a.m.   Original Report Authenticated By: Norva Pavlov, M.D.    Anti-infectives: Anti-infectives    Start     Dose/Rate Route Frequency Ordered Stop   2012/11/29 1030  piperacillin-tazobactam (ZOSYN) IVPB 3.375 g     3.375 g 12.5 mL/hr over 240 Minutes Intravenous 3 times per day 2012-11-29 1025        Assessment/Plan: s/p Procedure(s): laparoscopic exploration and EXPLORATORY LAPAROTOMY (N/A) continue bowel rest Will transfer to floor OOB today Continue Zosyn for perforated colon  LOS: 1 day    TOTH III,Jaylan Duggar S 11/09/2012

## 2012-11-09 NOTE — Progress Notes (Signed)
Patient ID: [name] [name], female   DOB: July 13, 1980, 32 y.o.   MRN: [id number] Came to visit with patient.  Up in chair.  States pain is controlled.  Breastpump is available and pt is using.  VSS UOP adequate Abdomen soft  POD #1 s/p perforation of  colon with resection and reanastomosis Stable Pumping and dumping breastmilk until off more of the medications and 24 hours post-anesthetics   Still on morphine and Zosyn

## 2012-11-10 ENCOUNTER — Encounter (HOSPITAL_COMMUNITY): Payer: Self-pay | Admitting: General Surgery

## 2012-11-10 LAB — CBC
MCH: 29 pg (ref 26.0–34.0)
MCHC: 31.9 g/dL (ref 30.0–36.0)
MCV: 90.9 fL (ref 78.0–100.0)
Platelets: 264 10*3/uL (ref 150–400)
RDW: 14.8 % (ref 11.5–15.5)
WBC: 6.5 10*3/uL (ref 4.0–10.5)

## 2012-11-10 LAB — BASIC METABOLIC PANEL
Calcium: 8.3 mg/dL — ABNORMAL LOW (ref 8.4–10.5)
Chloride: 105 mEq/L (ref 96–112)
Creatinine, Ser: 0.62 mg/dL (ref 0.50–1.10)
GFR calc Af Amer: >90 mL/min (ref >90)
GFR calc non Af Amer: >90 mL/min (ref >90)

## 2012-11-10 NOTE — Care Management Note (Addendum)
    Page 1 of 2   11/19/2012     1:18:00 PM   CARE MANAGEMENT NOTE 11/19/2012  Patient:  [name] [name], [name] [name]   Account Number:  [id number]  Date Initiated:  11/10/2012  Documentation initiated by:  Lorenda Ishihara  Subjective/Objective Assessment:   32 yo female admitted s/p explor. lap for bowel perf, colectomy. PTA lived at home with family, s/p c-section and deliver of daughter 6/2     Action/Plan:   Home when stable   Anticipated DC Date:  11/19/2012   Anticipated DC Plan:  HOME W HOME HEALTH SERVICES      DC Planning Services  CM consult      [facility] Choice  HOME HEALTH  DURABLE MEDICAL EQUIPMENT   Choice offered to / List presented to:  C-1 Patient   DME arranged  VAC      DME agency  [employer]     HH arranged  HH-1 RN      [facility] agency  [facility]   Status of service:  Completed, signed off Medicare Important Message given?   (If response is "NO", the following Medicare IM given date fields will be blank) Date Medicare IM given:   Date Additional Medicare IM given:    Discharge Disposition:  HOME W HOME HEALTH SERVICES  Per UR Regulation:  Reviewed for med. necessity/level of care/duration of stay  If discussed at Long Length of Stay Meetings, dates discussed:   11/13/2012    Comments:  11-18-12 Lorenda Ishihara RN CM 1000 Wound vac delivered to patient room, target d/c tomorrow. [facility] following for d/c services.  11/17/2012 Colleen Can BSN RN CCM [phone] Tct [employer]-regarding whether authorization had been obtained. Spoke with [place] [phone]) who will research and call me back.   11-12-12 Lorenda Ishihara RN CM [phone] Spoke with patient at bedside, mother present. Discussed likely need for [facility] RN to assist with wound vac. Patient did not have a preference for [facility] services. Contacted [facility] to follow for d/c planning needs. Will continue to follow, awaiting final orders. Also contacted [employer] to complete application for insurance coverage for wound vac.

## 2012-11-10 NOTE — Progress Notes (Signed)
Patient ID: [name] [name], female   DOB: Jan 24, 1981, 32 y.o.   MRN: [id number] I visited with the pt and she reports doing well as she could expect after surgery.

## 2012-11-10 NOTE — Progress Notes (Signed)
Patient ID: [name] [name], female   DOB: [birth year], 32 y.o.   MRN: [id number] 2 Days Post-Op  Subjective: Pt feels ok today.  No flatus, but no nausea.  Up in chair.  Objective: Vital signs in last 24 hours: Temp:  [98 F (36.7 C)-100.1 F (37.8 C)] 98 F (36.7 C) (06/09 1007) Pulse Rate:  [88-102] 88 (06/09 1007) Resp:  [15-22] 16 (06/09 1007) BP: (104-131)/(53-83) 123/82 mmHg (06/09 1007) SpO2:  [94 %-98 %] 98 % (06/09 1007) FiO2 (%):  [98 %] 98 % (06/08 1728) Last BM Date: 11/07/12  Intake/Output from previous day: 06/08 0701 - 06/09 0700 In: 2249 [I.V.:2049; IV Piggyback:200] Out: 1400 [Urine:1400] Intake/Output this shift: Total I/O In: 0  Out: 100 [Urine:100]  PE: Abd: soft, absent BS, ND, open wound is clean and packed.  Appropriately tender,   Lab Results:   Recent Labs  11/09/12 0346 11/10/12 0418  WBC 7.3 6.5  HGB 9.0* 8.6*  HCT 27.5* 27.0*  PLT 264 264   BMET  Recent Labs  11/09/12 0346 11/10/12 0418  NA 138 138  K 3.7 3.6  CL 107 105  CO2 26 27  GLUCOSE 99 100*  BUN 4* 6  CREATININE 0.59 0.62  CALCIUM 7.9* 8.3*   PT/INR No results found for this basename: LABPROT, INR,  in the last 72 hours CMP     Component Value Date/Time   NA 138 11/10/2012 0418   K 3.6 11/10/2012 0418   CL 105 11/10/2012 0418   CO2 27 11/10/2012 0418   GLUCOSE 100* 11/10/2012 0418   BUN 6 11/10/2012 0418   CREATININE 0.62 11/10/2012 0418   CALCIUM 8.3* 11/10/2012 0418   PROT 5.7* 11/08/2012 0834   ALBUMIN 2.4* 11/08/2012 0834   AST 14 11/08/2012 0834   ALT 14 11/08/2012 0834   ALKPHOS 113 11/08/2012 0834   BILITOT 0.3 11/08/2012 0834   GFRNONAA >90 11/10/2012 0418   GFRAA >90 11/10/2012 0418   Lipase     Component Value Date/Time   LIPASE 53 07/29/2012 2233       Studies/Results: No results found.  Anti-infectives: Anti-infectives   Start     Dose/Rate Route Frequency Ordered Stop   11/08/12 1030  piperacillin-tazobactam (ZOSYN) IVPB 3.375 g     3.375 g 12.5 mL/hr over  240 Minutes Intravenous 3 times per day 11/08/12 1025         Assessment/Plan 1. S/p ex lap with right colectomy for colonic ileus with serosal tear - 11/08/2012 - P. Toth 2. S/p c-section  Had daughter named Ava.  Mother in room with patient. 3. Ileus  Plan: 1. Mobilize more. 2. Await bowel function prior to starting any type of diet.  Will allow a few ice chips 3. Consider wound VAC for her midline wound, will d/w MD. 4. Cont abx therapy   LOS: 2 days    OSBORNE,KELLY E 11/10/2012, 10:43 AM Pager: [phone]  Agree with above. Needs to ambulate more.  Ovidio Kin, MD, [facility] Surgery Pager: [phone] Office phone:  [phone]

## 2012-11-11 LAB — BODY FLUID CULTURE

## 2012-11-11 NOTE — Progress Notes (Signed)
Patient ID: [name] [name], female   DOB: 29-Jan-1981, 32 y.o.   MRN: [id number] 3 Days Post-Op  Subjective: Patient says she doesn't feel quite as well today, but I think she actually looks better today.  Feels pressure like she needs to pass flatus but hasn't yet.  No nausea.  Ambulating some  Objective: Vital signs in last 24 hours: Temp:  [98 F (36.7 C)-99 F (37.2 C)] 99 F (37.2 C) (06/10 0648) Pulse Rate:  [76-88] 76 (06/10 0648) Resp:  [12-19] 16 (06/10 0808) BP: (107-123)/(68-82) 121/68 mmHg (06/10 0648) SpO2:  [96 %-99 %] 98 % (06/10 0808) Last BM Date: 11/07/12  Intake/Output from previous day: 06/09 0701 - 06/10 0700 In: 2525 [I.V.:2425; IV Piggyback:100] Out: 475 [Urine:475] Intake/Output this shift:    PE: Abd: soft, wound is clean and packed.  Pfannenstiel incision with staples still present, +BS, obese, appropriately tender. Abdominal binder in place   Lab Results:   Recent Labs  11/09/12 0346 11/10/12 0418  WBC 7.3 6.5  HGB 9.0* 8.6*  HCT 27.5* 27.0*  PLT 264 264   BMET  Recent Labs  11/09/12 0346 11/10/12 0418  NA 138 138  K 3.7 3.6  CL 107 105  CO2 26 27  GLUCOSE 99 100*  BUN 4* 6  CREATININE 0.59 0.62  CALCIUM 7.9* 8.3*   PT/INR No results found for this basename: LABPROT, INR,  in the last 72 hours CMP     Component Value Date/Time   NA 138 11/10/2012 0418   K 3.6 11/10/2012 0418   CL 105 11/10/2012 0418   CO2 27 11/10/2012 0418   GLUCOSE 100* 11/10/2012 0418   BUN 6 11/10/2012 0418   CREATININE 0.62 11/10/2012 0418   CALCIUM 8.3* 11/10/2012 0418   PROT 5.7* 11/08/2012 0834   ALBUMIN 2.4* 11/08/2012 0834   AST 14 11/08/2012 0834   ALT 14 11/08/2012 0834   ALKPHOS 113 11/08/2012 0834   BILITOT 0.3 11/08/2012 0834   GFRNONAA >90 11/10/2012 0418   GFRAA >90 11/10/2012 0418   Lipase     Component Value Date/Time   LIPASE 53 07/29/2012 2233       Studies/Results: No results found.  Anti-infectives: Anti-infectives   Start     Dose/Rate Route  Frequency Ordered Stop   11/08/12 1030  piperacillin-tazobactam (ZOSYN) IVPB 3.375 g     3.375 g 12.5 mL/hr over 240 Minutes Intravenous 3 times per day 11/08/12 1025         Assessment/Plan  1. S/p c-section, POD#8 2. S/p ex lap with right colectomy, POD#3 3. Post op ileus  Plan: 1. No flatus yet, will just allow sips and chips.  No tray yet. 2. Cont to mobilize 3. Will have WOC place abdominal wound VAC to assist with granulation tissue and healing 4. Cont PCA 5. Suspect she should be ok to dc staples on Thursday with next VAC change, will confirm if that's ok with OB first.   LOS: 3 days    OSBORNE,KELLY E 11/11/2012, 10:04 AM Pager: [phone]  Overall seems better. Agree with attempt at [facility]. Needs to continue to ambulate.  Ovidio Kin, MD, [facility] Surgery Pager: [phone] Office phone:  [phone]

## 2012-11-11 NOTE — Progress Notes (Signed)
ANTIBIOTIC CONSULT NOTE - FOLLOW UP  Pharmacy Consult for Zosyn Indication: Perforated bowel/colectomy  No Known Allergies  Patient Measurements: Height: 5\' 7"  (170.2 cm) Weight: 278 lb 7.1 oz (126.3 kg) IBW/kg (Calculated) : 61.6  Vital Signs: Temp: 99 F (37.2 C) (06/10 0648) Temp src: Oral (06/10 0648) BP: 121/68 mmHg (06/10 0648) Pulse Rate: 76 (06/10 0648) Intake/Output from previous day: 06/09 0701 - 06/10 0700 In: 2525 [I.V.:2425; IV Piggyback:100] Out: 475 [Urine:475] Intake/Output from this shift:    Labs:  Recent Labs  11/08/12 1540 11/09/12 0346 11/10/12 0418  WBC 8.0 7.3 6.5  HGB 8.9* 9.0* 8.6*  PLT 222 264 264  CREATININE 0.44* 0.59 0.62   Estimated Creatinine Clearance: 140.7 ml/min (by C-G formula based on Cr of 0.62).    Microbiology: Recent Results (from the past 720 hour(s))  BODY FLUID CULTURE     Status: None   Collection Time    11/08/12 12:17 PM      Result Value Range Status   Specimen Description PERITONEAL   Final   Special Requests NONE   Final   Gram Stain     Final   Value: ABUNDANT WBC PRESENT, PREDOMINANTLY PMN     NO ORGANISMS SEEN     Performed by [facility] Gram Stain Report Called to,Read Back By and Verified With: Gram Stain Report Called to,Read Back By and Verified With: DR Carolynne Edouard [id number] @ 1355BY J SCOTTON   Culture RARE ESCHERICHIA COLI   Final   Report Status PENDING   Incomplete  ANAEROBIC CULTURE     Status: None   Collection Time    11/08/12 12:17 PM      Result Value Range Status   Specimen Description PERITONEAL   Final   Special Requests NONE   Final   Gram Stain     Final   Value: WBC PRESENT, PREDOMINANTLY PMN     NO ORGANISMS SEEN   Culture     Final   Value: NO ANAEROBES ISOLATED; CULTURE IN PROGRESS FOR 5 DAYS   Report Status PENDING   Incomplete  GRAM STAIN     Status: None   Collection Time    11/08/12 12:17 PM      Result Value Range Status   Specimen Description PERITONEAL   Final   Special Requests NONE   Final   Gram Stain     Final   Value: ABUNDANT WBC PRESENT, PREDOMINANTLY PMN     NO ORGANISMS SEEN     Gram Stain Report Called to,Read Back By and Verified With: DR Carolynne Edouard [id number] @ 1355 BY J SCOTTON   Report Status 11/08/2012 FINAL   Final  MRSA PCR SCREENING     Status: None   Collection Time    11/08/12  3:07 PM      Result Value Range Status   MRSA by PCR NEGATIVE  NEGATIVE Final   Comment:            The GeneXpert MRSA Assay (FDA     approved for NASAL specimens     only), is one component of a     comprehensive MRSA colonization     surveillance program. It is not     intended to diagnose MRSA     infection nor to guide or     monitor treatment for     MRSA infections.    Anti-infectives   Start     Dose/Rate Route Frequency Ordered Stop   11/08/12  1030  piperacillin-tazobactam (ZOSYN) IVPB 3.375 g     3.375 g 12.5 mL/hr over 240 Minutes Intravenous 3 times per day 11/08/12 1025        Assessment: 31yoF s/p expl lap for perforated bowel, colectomy for colonic ileus w/ serosal tear 6/7. C section 6/2, some abd pain, colonic dilatation on abd Xray, home 6/5. Admitted with sharp pain. Hx of gallstones and planned cholecystectomy after pregnancy. Pt is breast-feeding.  POD3 right colectomy   Day #4 Zosyn 3.375g q8h  Renal function stable, CrCl >100 ml/min  Tm 99, WBC normal  No organisms seen on peritoneal cultures   Goal of Therapy:  Eradication of infection, dose per renal function  Plan:   Continue Zosyn 3.375gm IV q8h (4hr extended infusions) Follow up renal function & cultures Follow up duration of therapy  Loralee Pacas, PharmD, BCPS Pager: [phone] 11/11/2012,8:27 AM

## 2012-11-11 NOTE — Consult Note (Signed)
WOC consult Note: Patient not seen today Reason for Consult:Placement of abdominal wound V.A.C. (NPWT) Wound type:Surgical Pressure Ulcer POA: No. Spoke with Barnetta Chapel, PA  and it is all right for surgical floor nurse to place initial negative pressure wound therapy (NPWT) device and obtain measurements. Please call  Tresa Endo when ready to place dressing so that she can be in attendance.  RN K. Bracey notified and verbalizes understanding. I will not follow, but will remain available to this patient, her nursing and surgical teams.  Please re-consult if needed. Thanks, Ladona Mow, MSN, RN, [facility], CWOCN [phone])

## 2012-11-11 NOTE — Progress Notes (Signed)
Patient ID: [name] [name], female   DOB: [birth year], 32 y.o.   MRN: [id number] Visited with pt Baby doing well

## 2012-11-12 MED ORDER — PROMETHAZINE HCL 25 MG/ML IJ SOLN
12.5000 mg | Freq: Four times a day (QID) | INTRAMUSCULAR | Status: DC | PRN
  Administered 2012-11-12 – 2012-11-17 (×5): 12.5 mg via INTRAVENOUS
  Filled 2012-11-12 (×5): qty 1

## 2012-11-12 MED ORDER — PROMETHAZINE HCL 25 MG/ML IJ SOLN
25.0000 mg | Freq: Once | INTRAMUSCULAR | Status: AC
  Administered 2012-11-12: 25 mg via INTRAMUSCULAR
  Filled 2012-11-12: qty 1

## 2012-11-12 NOTE — Consult Note (Signed)
WOC consult Note Update:  Barnetta Chapel, CCS PA  and RN did not place V.A.C. last pm and wound care was ordered to place a saline dressing twice daily until NPWT initiated this am. Blase Mess  PA place this am.  No wound measurements obtained.  RN instructed to ensure that wound measurements are obtained on Friday with routine V.A.C. dressing change and to document same. I will not follow.  Please re-consult if needed. Thanks, Ladona Mow, MSN, RN, [facility] CWOCN [phone])

## 2012-11-12 NOTE — Progress Notes (Signed)
Patient ID: [name] [name], female   DOB: 1981/01/13, 32 y.o.   MRN: [id number] 4 Days Post-Op  Subjective: Vomiting last night x 1, feels somewhat nauseated today, denies increase in pain, PCA working well, walking in halls  Objective: Vital signs in last 24 hours: Temp:  [98.2 F (36.8 C)-98.9 F (37.2 C)] 98.9 F (37.2 C) (06/11 0545) Pulse Rate:  [83-93] 93 (06/11 0545) Resp:  [13-19] 18 (06/11 0545) BP: (102-110)/(69-76) 110/76 mmHg (06/11 0545) SpO2:  [96 %-100 %] 100 % (06/11 0545) FiO2 (%):  [34 %-36 %] 36 % (06/11 0400) Last BM Date: 11/07/12  Intake/Output from previous day: 06/10 0701 - 06/11 0700 In: 1485 [P.O.:60; I.V.:1275; IV Piggyback:150] Out: -  Intake/Output this shift:    PE: Abd: soft, tender over wound, +bs, wound healthy with beefy red tissue, wound vac placed, staples removed from c-section wound and port sites General: NAD  Lab Results:   Recent Labs  11/10/12 0418  WBC 6.5  HGB 8.6*  HCT 27.0*  PLT 264   BMET  Recent Labs  11/10/12 0418  NA 138  K 3.6  CL 105  CO2 27  GLUCOSE 100*  BUN 6  CREATININE 0.62  CALCIUM 8.3*   PT/INR No results found for this basename: LABPROT, INR,  in the last 72 hours CMP     Component Value Date/Time   NA 138 11/10/2012 0418   K 3.6 11/10/2012 0418   CL 105 11/10/2012 0418   CO2 27 11/10/2012 0418   GLUCOSE 100* 11/10/2012 0418   BUN 6 11/10/2012 0418   CREATININE 0.62 11/10/2012 0418   CALCIUM 8.3* 11/10/2012 0418   PROT 5.7* 11/08/2012 0834   ALBUMIN 2.4* 11/08/2012 0834   AST 14 11/08/2012 0834   ALT 14 11/08/2012 0834   ALKPHOS 113 11/08/2012 0834   BILITOT 0.3 11/08/2012 0834   GFRNONAA >90 11/10/2012 0418   GFRAA >90 11/10/2012 0418   Lipase     Component Value Date/Time   LIPASE 53 07/29/2012 2233       Studies/Results: No results found.  Anti-infectives: Anti-infectives   Start     Dose/Rate Route Frequency Ordered Stop   11/08/12 1030  piperacillin-tazobactam (ZOSYN) IVPB 3.375 g     3.375  g 12.5 mL/hr over 240 Minutes Intravenous 3 times per day 11/08/12 1025         Assessment/Plan 1. S/p c-section, POD#9 2. S/p ex lap with right colectomy, POD#4  S/p ex lap with right colectomy for colonic ileus with serosal tear - 11/08/2012 - P. Toth 3. Post op ileus  Wound vac placed today, start MWF changes Staples removed. Keep on ice chips/sips given vomiting last night and no bowel function Keep on PCA for now until we can get better bowel function   LOS: 4 days    [name], [name] 11/12/2012  Agree with above. Wound now with VAC. Now with nausea.  Because of this, she has not been up much.  I have encouraged to still walk.  Ovidio Kin, MD, [facility] Pager: [phone] Office phone:  [phone]

## 2012-11-12 NOTE — Progress Notes (Signed)
Patient ID: [name] [name], female   DOB: 11-16-1980, 32 y.o.   MRN: [id number] Visited with the pt She reported GI distress during the night

## 2012-11-13 ENCOUNTER — Inpatient Hospital Stay (HOSPITAL_COMMUNITY): Payer: Medicaid Other

## 2012-11-13 LAB — BASIC METABOLIC PANEL
BUN: 6 mg/dL (ref 6–23)
Creatinine, Ser: 0.68 mg/dL (ref 0.50–1.10)
GFR calc Af Amer: >90 mL/min (ref >90)
GFR calc non Af Amer: >90 mL/min (ref >90)
Potassium: 4 mEq/L (ref 3.5–5.1)

## 2012-11-13 LAB — ANAEROBIC CULTURE

## 2012-11-13 MED ORDER — PROMETHAZINE HCL 25 MG/ML IJ SOLN
12.5000 mg | INTRAMUSCULAR | Status: AC
  Administered 2012-11-13: 12.5 mg via INTRAVENOUS
  Filled 2012-11-13: qty 1

## 2012-11-13 NOTE — Progress Notes (Signed)
Patient ID: [name] [name], female   DOB: August 02, 1980, 32 y.o.   MRN: [id number] I visited with the pt and she reports cramping pain prior to vomiting. Also, a loose BM this AM

## 2012-11-13 NOTE — Progress Notes (Signed)
Patient ID: [name] [name], female   DOB: 07/02/1980, 32 y.o.   MRN: [id number] 5 Days Post-Op  Subjective: Significant nausea and vomiting last night and this, zofran not working, promethazine helped some but very nauseated right now, pain controlled, +BM this am but was all liquid,  Objective: Vital signs in last 24 hours: Temp:  [97.9 F (36.6 C)-99.1 F (37.3 C)] 97.9 F (36.6 C) (06/12 0611) Pulse Rate:  [77-99] 77 (06/12 0611) Resp:  [13-18] 18 (06/12 0710) BP: (113-126)/(75-80) 113/79 mmHg (06/12 0611) SpO2:  [95 %-99 %] 96 % (06/12 0710) Last BM Date: 11/13/12  Intake/Output from previous day: 06/11 0701 - 06/12 0700 In: 2358.3 [I.V.:2358.3] Out: -  Intake/Output this shift: Total I/O In: -  Out: 2 [Emesis/NG output:2]  PE: Abd: soft, tender over wound, +bs, wound vac in place, c-section incision healing well, No bowel sounds General: NAD, appears very nauseated, very weak appearing  Lab Results:  No results found for this basename: WBC, HGB, HCT, PLT,  in the last 72 hours BMET  Recent Labs  11/13/12 0534  NA 138  K 4.0  CL 103  CO2 26  GLUCOSE 116*  BUN 6  CREATININE 0.68  CALCIUM 8.4   PT/INR No results found for this basename: LABPROT, INR,  in the last 72 hours CMP     Component Value Date/Time   NA 138 11/13/2012 0534   K 4.0 11/13/2012 0534   CL 103 11/13/2012 0534   CO2 26 11/13/2012 0534   GLUCOSE 116* 11/13/2012 0534   BUN 6 11/13/2012 0534   CREATININE 0.68 11/13/2012 0534   CALCIUM 8.4 11/13/2012 0534   PROT 5.7* 11/08/2012 0834   ALBUMIN 2.4* 11/08/2012 0834   AST 14 11/08/2012 0834   ALT 14 11/08/2012 0834   ALKPHOS 113 11/08/2012 0834   BILITOT 0.3 11/08/2012 0834   GFRNONAA >90 11/13/2012 0534   GFRAA >90 11/13/2012 0534   Lipase     Component Value Date/Time   LIPASE 53 07/29/2012 2233     Studies/Results: No results found.  Anti-infectives: Anti-infectives   Start     Dose/Rate Route Frequency Ordered Stop   11/08/12 1030   piperacillin-tazobactam (ZOSYN) IVPB 3.375 g     3.375 g 12.5 mL/hr over 240 Minutes Intravenous 3 times per day 11/08/12 1025        Assessment/Plan 1. S/p c-section, POD#10 2. S/p ,ex lap with right colectomy for colonic ileus with serosal tear - 11/08/2012 - P. Toth POD#5   --wound vac changes MWF 3. Post op ileus: much worse today  --check abd film  --extra one time dose of promethazine  --keep NPO  --?NGT if vomiting persist  Wound vac MWF changes   LOS: 5 days   [name], [name] 11/13/2012 [facility] Office phone:  [phone]  Still feels puny. 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  Path report given to patient. Has ileus by KUB.  But is passing stools through rectum, though has some funny LLQ discomfort. Mother and husband in room.  Ovidio Kin, MD, [facility] Pager: [phone] Office phone:  [phone]

## 2012-11-14 MED ORDER — INSULIN ASPART 100 UNIT/ML ~~LOC~~ SOLN: 0.0000 [IU] | Freq: Four times a day (QID) | SUBCUTANEOUS | Status: DC

## 2012-11-14 MED ORDER — TRACE MINERALS CR-CU-F-FE-I-MN-MO-SE-ZN IV SOLN
INTRAVENOUS | Status: AC
  Administered 2012-11-14: 18:00:00 via INTRAVENOUS
  Filled 2012-11-14: qty 1000

## 2012-11-14 MED ORDER — KCL IN DEXTROSE-NACL 20-5-0.9 MEQ/L-%-% IV SOLN
INTRAVENOUS | Status: DC
  Administered 2012-11-14 – 2012-11-18 (×6): via INTRAVENOUS
  Filled 2012-11-14 (×8): qty 1000

## 2012-11-14 MED ORDER — SODIUM CHLORIDE 0.9 % IJ SOLN
10.0000 mL | INTRAMUSCULAR | Status: DC | PRN
  Administered 2012-11-17 – 2012-11-18 (×2): 10 mL

## 2012-11-14 MED ORDER — FAT EMULSION 20 % IV EMUL
240.0000 mL | INTRAVENOUS | Status: AC
  Administered 2012-11-14: 240 mL via INTRAVENOUS
  Filled 2012-11-14: qty 250

## 2012-11-14 NOTE — Progress Notes (Signed)
ANTIBIOTIC CONSULT NOTE - FOLLOW UP  Pharmacy Consult for Zosyn Indication: Perforated bowel/colectomy  No Known Allergies  Vital Signs: Temp: 98.4 F (36.9 C) (06/13 1610) Temp src: Oral (06/13 0613) BP: 124/70 mmHg (06/13 0613) Pulse Rate: 77 (06/13 0613) Intake/Output from previous day: 06/12 0701 - 06/13 0700 In: 2423.3 [I.V.:2423.3] Out: 1152 [Urine:1150; Emesis/NG output:2] Intake/Output from this shift:    Labs:  Recent Labs  11/13/12 0534  CREATININE 0.68   Assessment: 57 yoF with perfed colon s/p expl lap, R colectomy on 6/7. Zosyn ordered empirically. C-section 6/2 - currently breastfeeding.   Today is D#7 Zosyn.  Patient is afebrile, WBC and renal function wnl on 6/9.  Peritoneal fluid on 6/6 with rare E.coli (R-amp/unasyn; I-cefoxtin; S-zosyn, CTX, FQ, AG,imip)   Plan:   MD, if not worrying for other sources of infection - consider narrowing antibiotics (?Rocephin).  Continue Zosyn 3.375gm IV q8h for now   Pharmacy will sign off from formal notes writing for Zosyn consult - will f/u peripherally  Geoffry Paradise, PharmD, BCPS Pager: [phone] 12:09 PM Pharmacy #: [id number]

## 2012-11-14 NOTE — Progress Notes (Addendum)
PARENTERAL NUTRITION CONSULT NOTE - INITIAL  Pharmacy Consult for TNA Indication: prolonged ileus  No Known Allergies  Patient Measurements: Height: 5\' 7"  (170.2 cm) Weight: 278 lb 7.1 oz (126.3 kg) IBW/kg (Calculated) : 61.6 Adjusted Body Weight: 81 kg  Vital Signs: Temp: 98.4 F (36.9 C) (06/13 1610) Temp src: Oral (06/13 0613) BP: 124/70 mmHg (06/13 9604) Pulse Rate: 77 (06/13 0613) Intake/Output from previous day: 06/12 0701 - 06/13 0700 In: 2423.3 [I.V.:2423.3] Out: 1152 [Urine:1150; Emesis/NG output:2] Intake/Output from this shift:    Labs: No results found for this basename: WBC, HGB, HCT, PLT, APTT, INR,  in the last 72 hours   Recent Labs  11/13/12 0534  NA 138  K 4.0  CL 103  CO2 26  GLUCOSE 116*  BUN 6  CREATININE 0.68  CALCIUM 8.4   Estimated Creatinine Clearance: 140.7 ml/min (by C-G formula based on Cr of 0.68).   No results found for this basename: GLUCAP,  in the last 72 hours  Medical History: Past Medical History  Diagnosis Date  . Gallstones   . Cholecystitis chronic, acute   . Heart murmur     no indications for 7-78yrs per pt.  . Asthma     childhood exercise induced only  . Headache(784.0)     migraines    Assessment:  34 yoF s/p cesarean section 6/2 admitted 6/7 with perforated cecum required emergent exploratory laparotomy with R colectomy. Pt developed post-op ileus with significant nausea & vomiting 6/12 and no return of bowel function.  MD ordered for TNA to start 6/13.  PICC ordered, awaiting call back from IV team to confirm placement.  Pt is currently breastfeeding.  Nutritional Goals:  - RD recommendations: pending  - Clinimix E 5/20 @ 80 ml/hr + IVF 20% at 10 ml/hr will provide: 96 g protein, 2169 Kcal daily  Current nutrition:  - Diet: NPO - TNA: to start 6/13 - mIVF: D5-NS + KCl 20 mEq/L @ 100 ml/hr  CBGs & Insulin requirements past 24 hours:  - Controlled, no insulin on board  Labs: Electrolytes: wnl Renal  Function: Scr wnl/stable. UOP 0.39 ml/kg/hr Hepatic Function: wnl on 6/7, labs in AM Pre-Albumin: pending tomorrow Triglycerides: pending tomorrow CBGs: controlled, no h/o DM    Plan: If PICC is placed, at 1800 tonight  Start Clinimix E 5/20 @ 40 ml/hr  IV fat emulsion 20% at 10 ml/hr daily  Standard multivitamins and trace elements daily  Reduce IVF to 60 ml/hr.  Add sensitive SSI q6h  TNA labs Monday/Thursdays  Dietician consult  Pharmacy will follow up daily  Geoffry Paradise, PharmD, BCPS Pager: [phone] 11:09 AM Pharmacy #: [id number]

## 2012-11-14 NOTE — Progress Notes (Signed)
Patient ID: [name] [name], female   DOB: 05-11-1981, 32 y.o.   MRN: [id number] I visited the pt and she reports doing better. No nausea and 3 loose BM's No flatus.

## 2012-11-14 NOTE — Progress Notes (Signed)
11/14/12 1227 Patient complained of right hand swelling. Elevated and applied warm compress. Marisue Ivan, [place] notified and agreed with elevation and warm compress. Will continue to monitor.

## 2012-11-14 NOTE — Progress Notes (Signed)
Peripherally Inserted Central Catheter/Midline Placement  The IV Nurse has discussed with the patient and/or persons authorized to consent for the patient, the purpose of this procedure and the potential benefits and risks involved with this procedure.  The benefits include less needle sticks, lab draws from the catheter and patient may be discharged home with the catheter.  Risks include, but not limited to, infection, bleeding, blood clot (thrombus formation), and puncture of an artery; nerve damage and irregular heat beat.  Alternatives to this procedure were also discussed.  PICC/Midline Placement Documentation  PICC / Midline Double Lumen 11/14/12 PICC Right Basilic (Active)       [name] [name], [name] [name] 11/14/2012, 4:45 PM

## 2012-11-14 NOTE — Progress Notes (Signed)
INITIAL NUTRITION ASSESSMENT  DOCUMENTATION CODES Per approved criteria  -Morbid Obesity   INTERVENTION: TPN per PharmD Diet advancement per MD; recommend allowing clear liquids  NUTRITION DIAGNOSIS: Inadequate oral intake related to inability to eat as evidenced by pt NPO due to post-op nausea, vomiting, and ileus.   Goal: Pt to meet >/= 90% of their estimated nutrition needs Resolution of Nausea/vomiting Resolution of ileus  Monitor:  TPN initiation/rate Bowel function Diet advancement Weight Labs  Reason for Assessment: Consult for new TPN/TNA  32 y.o. female  Admitting Dx: Cecum perforation  ASSESSMENT: 79 yoF s/p cesarean section 6/2 admitted 6/7 with perforated cecum required emergent exploratory laparotomy with R colectomy. Pt developed post-op ileus with significant nausea & vomiting 6/12 and no return of bowel function. MD ordered for TNA to start 6/13. PICC ordered, awaiting call back from IV team to confirm placement.  Pt reports that she is currently pumping breast milk and dumping; pt is pumping 4 oz of breast milk, 3-4 times per 24 hours. Pt states she weighed 270 lbs prior to pregnancy and lost weight during pregnancy due to gallstones, pain, nausea, and vomiting causing her weight to drop to 260 lbs at full term. Pt states that she has been sipping on apple juice and eating ice chips today. Pt has been NPO since 6/7. Per PA note pt has had no BM of flatus in the past 24 hours; per nursing note pt had BM on 6/12 at 2001; pt reports that she is having bowel movements but, no flatus.  Pt weighed 257 lbs on 11/03/12, suspect that current weight of 278 lbs is related to fluids.   Height: Ht Readings from Last 1 Encounters:  11/09/12 5\' 7"  (1.702 m)    Weight: Wt Readings from Last 1 Encounters:  11/09/12 278 lb 7.1 oz (126.3 kg)    Ideal Body Weight: 135 lbs  % Ideal Body Weight: 205%  Wt Readings from Last 10 Encounters:  11/09/12 278 lb 7.1 oz (126.3 kg)   11/09/12 278 lb 7.1 oz (126.3 kg)  11/03/12 257 lb (116.574 kg)  11/03/12 257 lb (116.574 kg)  10/31/12 257 lb (116.574 kg)  09/24/12 269 lb (122.018 kg)  07/03/12 268 lb 8 oz (121.791 kg)  03/18/12 270 lb (122.471 kg)  12/05/10 243 lb (110.224 kg)  11/28/10 243 lb (110.224 kg)    Usual Body Weight: 270 lbs  % Usual Body Weight: 103%  BMI:  Body mass index is 43.6 kg/(m^2).  Estimated Nutritional Needs: Kcal: 2220-2530 Protein: 93-108 grams Fluid: 3.6-4 L  Skin: abdominal incision, 2 abdominal ports  Diet Order: NPO  EDUCATION NEEDS: -No education needs identified at this time   Intake/Output Summary (Last 24 hours) at 11/14/12 1213 Last data filed at 11/14/12 0645  Gross per 24 hour  Intake 2023.33 ml  Output    850 ml  Net 1173.33 ml    Last BM: 6/12  Labs:   Recent Labs Lab 11/09/12 0346 11/10/12 0418 11/13/12 0534  NA 138 138 138  K 3.7 3.6 4.0  CL 107 105 103  CO2 26 27 26   BUN 4* 6 6  CREATININE 0.59 0.62 0.68  CALCIUM 7.9* 8.3* 8.4  GLUCOSE 99 100* 116*    CBG (last 3)  No results found for this basename: GLUCAP,  in the last 72 hours  Scheduled Meds: . enoxaparin (LOVENOX) injection  40 mg Subcutaneous Q24H  . morphine   Intravenous Q4H  . pantoprazole (PROTONIX) IV  40  mg Intravenous Q24H  . piperacillin-tazobactam (ZOSYN)  IV  3.375 g Intravenous Q8H    Continuous Infusions: . dextrose 5 % and 0.9 % NaCl with KCl 20 mEq/L 100 mL/hr at 11/14/12 1023    Past Medical History  Diagnosis Date  . Gallstones   . Cholecystitis chronic, acute   . Heart murmur     no indications for 7-76yrs per pt.  . Asthma     childhood exercise induced only  . Headache(784.0)     migraines    Past Surgical History  Procedure Laterality Date  . No past surgeries    . Cesarean section N/A 11/03/2012    Procedure: PRIMARY CESAREAN SECTION;  Surgeon: Bing Plume, MD;  Location: [facility];  Service: Obstetrics;  Laterality: N/A;  . Laparotomy N/A  11/08/2012    Procedure: laparoscopic exploration and EXPLORATORY LAPAROTOMY;  Surgeon: Robyne Askew, MD;  Location: [facility];  Service: General;  Laterality: N/A;    Ian Malkin RD, LDN Inpatient Clinical Dietitian Pager: [phone] After Hours Pager: [phone]

## 2012-11-14 NOTE — Progress Notes (Signed)
Patient ID: Alcide Evener, female   DOB: [birth year], 32 y.o.   MRN: [id number] 6 Days Post-Op  Subjective: Still with nausea and vomitingbut a little better, pain controlled, no BM or flatus in 24 hours, still feels very weak,  Objective: Vital signs in last 24 hours: Temp:  [98.1 F (36.7 C)-99 F (37.2 C)] 98.4 F (36.9 C) (06/13 7829) Pulse Rate:  [77-89] 77 (06/13 0613) Resp:  [11-19] 16 (06/13 0800) BP: (112-124)/(52-73) 124/70 mmHg (06/13 0613) SpO2:  [94 %-96 %] 96 % (06/13 0613) FiO2 (%):  [34 %-36 %] 34 % (06/13 0455) Last BM Date: 11/13/12  Intake/Output from previous day: 06/12 0701 - 06/13 0700 In: 2423.3 [I.V.:2423.3] Out: 1152 [Urine:1150; Emesis/NG output:2] Intake/Output this shift:    PE: Abd: soft, tender over wound, +bs, wound vac in place, c-section incision healing well, No bowel sounds General: NAD, appears very nauseated, very weak appearing  Lab Results:  No results found for this basename: WBC, HGB, HCT, PLT,  in the last 72 hours BMET  Recent Labs  11/13/12 0534  NA 138  K 4.0  CL 103  CO2 26  GLUCOSE 116*  BUN 6  CREATININE 0.68  CALCIUM 8.4   PT/INR No results found for this basename: LABPROT, INR,  in the last 72 hours CMP     Component Value Date/Time   NA 138 11/13/2012 0534   K 4.0 11/13/2012 0534   CL 103 11/13/2012 0534   CO2 26 11/13/2012 0534   GLUCOSE 116* 11/13/2012 0534   BUN 6 11/13/2012 0534   CREATININE 0.68 11/13/2012 0534   CALCIUM 8.4 11/13/2012 0534   PROT 5.7* 11/08/2012 0834   ALBUMIN 2.4* 11/08/2012 0834   AST 14 11/08/2012 0834   ALT 14 11/08/2012 0834   ALKPHOS 113 11/08/2012 0834   BILITOT 0.3 11/08/2012 0834   GFRNONAA >90 11/13/2012 0534   GFRAA >90 11/13/2012 0534   Lipase     Component Value Date/Time   LIPASE 53 07/29/2012 2233     Studies/Results: Dg Abd Portable 1v  11/13/2012   *RADIOLOGY REPORT*  Clinical Data: Nausea and vomiting.  Recent exploratory laparotomy.  PORTABLE ABDOMEN - 1 VIEW  Comparison:  11/08/2012.  Findings: Single supine view of the abdomen shows gaseous distention of small bowel and colon.  A catheter or drain overlies the sacrum.  IMPRESSION: Bowel gas pattern suggests a postoperative ileus.  Partial small bowel obstruction is difficult to completely exclude.   Original Report Authenticated By: Leanna Battles, M.D.    Anti-infectives: Anti-infectives   Start     Dose/Rate Route Frequency Ordered Stop   11/08/12 1030  piperacillin-tazobactam (ZOSYN) IVPB 3.375 g     3.375 g 12.5 mL/hr over 240 Minutes Intravenous 3 times per day 11/08/12 1025        Assessment/Plan 1. S/p c-section, POD#11 2. S/p ,ex lap with right colectomy for colonic ileus with serosal tear - 11/08/2012 - P. Toth POD#5   --wound vac changes MWF 3. Post op ileus: persistent, poor nutrition due to NPO status  --PICC and TNA today  --keep NPO  --?NGT if vomiting persist    Wound vac MWF changes   LOS: 6 days   WHITE, [name] 11/14/2012 [facility] Office phone:  [phone]  Agree with above.  Ovidio Kin, MD, [facility] Pager: [phone] Office phone:  [phone]

## 2012-11-15 LAB — CBC
Hemoglobin: 8.3 g/dL — ABNORMAL LOW (ref 12.0–15.0)
MCH: 29.9 pg (ref 26.0–34.0)
MCHC: 33.9 g/dL (ref 30.0–36.0)
MCV: 88.1 fL (ref 78.0–100.0)
Platelets: 245 10*3/uL (ref 150–400)
RBC: 2.78 MIL/uL — ABNORMAL LOW (ref 3.87–5.11)

## 2012-11-15 LAB — COMPREHENSIVE METABOLIC PANEL
AST: 8 U/L (ref 0–37)
Albumin: 1.9 g/dL — ABNORMAL LOW (ref 3.5–5.2)
Calcium: 8.2 mg/dL — ABNORMAL LOW (ref 8.4–10.5)
Chloride: 106 mEq/L (ref 96–112)
Creatinine, Ser: 0.49 mg/dL — ABNORMAL LOW (ref 0.50–1.10)
Total Protein: 5 g/dL — ABNORMAL LOW (ref 6.0–8.3)

## 2012-11-15 LAB — DIFFERENTIAL
Basophils Relative: 0 % (ref 0–1)
Eosinophils Absolute: 0.1 10*3/uL (ref 0.0–0.7)
Eosinophils Relative: 3 % (ref 0–5)
Lymphs Abs: 1.2 10*3/uL (ref 0.7–4.0)
Monocytes Relative: 6 % (ref 3–12)

## 2012-11-15 LAB — MAGNESIUM: Magnesium: 1.7 mg/dL (ref 1.5–2.5)

## 2012-11-15 MED ORDER — POTASSIUM CHLORIDE 10 MEQ/100ML IV SOLN
10.0000 meq | INTRAVENOUS | Status: AC
  Administered 2012-11-15 (×6): 10 meq via INTRAVENOUS
  Filled 2012-11-15 (×6): qty 100

## 2012-11-15 MED ORDER — TRACE MINERALS CR-CU-F-FE-I-MN-MO-SE-ZN IV SOLN
INTRAVENOUS | Status: AC
  Administered 2012-11-15: 17:00:00 via INTRAVENOUS
  Filled 2012-11-15: qty 1000

## 2012-11-15 MED ORDER — FAT EMULSION 20 % IV EMUL
240.0000 mL | INTRAVENOUS | Status: AC
  Administered 2012-11-15: 240 mL via INTRAVENOUS
  Filled 2012-11-15: qty 250

## 2012-11-15 NOTE — Progress Notes (Signed)
Patient ID: [name] [name], female   DOB: 1980-07-21, 32 y.o.   MRN: [id number] [facility] Progress Note:   7 Days Post-Op  Subjective: Mental status is clear.   Objective: Vital signs in last 24 hours: Temp:  [98.3 F (36.8 C)-99 F (37.2 C)] 98.3 F (36.8 C) (06/14 0503) Pulse Rate:  [72-81] 81 (06/14 0503) Resp:  [14-20] 14 (06/14 0840) BP: (115-124)/(70-82) 120/82 mmHg (06/14 0503) SpO2:  [95 %-99 %] 98 % (06/14 0840)  Intake/Output from previous day: 06/13 0701 - 06/14 0700 In: 2276.7 [I.V.:1513.3; IV Piggyback:150; TPN:613.3] Out: 2850 [Urine:2850] Intake/Output this shift: Total I/O In: -  Out: 925 [Urine:800; Drains:125]  Physical Exam: Work of breathing is normal.  VAC in place.  TNA and antibiotics going.    Lab Results:  Results for orders placed during the hospital encounter of 11/08/12 (from the past 48 hour(s))  GLUCOSE, CAPILLARY     Status: Abnormal   Collection Time    11/14/12 11:51 PM      Result Value Range   Glucose-Capillary 101 (*) 70 - 99 mg/dL  COMPREHENSIVE METABOLIC PANEL     Status: Abnormal   Collection Time    11/15/12  3:30 AM      Result Value Range   Sodium 138  135 - 145 mEq/L   Potassium 2.9 (*) 3.5 - 5.1 mEq/L   Comment: DELTA CHECK NOTED     REPEATED TO VERIFY   Chloride 106  96 - 112 mEq/L   CO2 26  19 - 32 mEq/L   Glucose, Bld 107 (*) 70 - 99 mg/dL   BUN 3 (*) 6 - 23 mg/dL   Creatinine, Ser 4.09 (*) 0.50 - 1.10 mg/dL   Calcium 8.2 (*) 8.4 - 10.5 mg/dL   Total Protein 5.0 (*) 6.0 - 8.3 g/dL   Albumin 1.9 (*) 3.5 - 5.2 g/dL   AST 8  0 - 37 U/L   ALT 5  0 - 35 U/L   Alkaline Phosphatase 68  39 - 117 U/L   Total Bilirubin 0.3  0.3 - 1.2 mg/dL   GFR calc non Af Amer >90  >90 mL/min   GFR calc Af Amer >90  >90 mL/min   Comment:            The eGFR has been calculated     using the CKD EPI equation.     This calculation has not been     validated in all clinical     situations.     eGFR's persistently     <90  mL/min signify     possible Chronic Kidney Disease.  MAGNESIUM     Status: None   Collection Time    11/15/12  3:30 AM      Result Value Range   Magnesium 1.7  1.5 - 2.5 mg/dL  PHOSPHORUS     Status: None   Collection Time    11/15/12  3:30 AM      Result Value Range   Phosphorus 2.8  2.3 - 4.6 mg/dL  TRIGLYCERIDES     Status: Abnormal   Collection Time    11/15/12  3:30 AM      Result Value Range   Triglycerides 200 (*) <150 mg/dL  CBC     Status: Abnormal   Collection Time    11/15/12  3:30 AM      Result Value Range   WBC 4.6  4.0 - 10.5 K/uL   RBC 2.78 (*)  3.87 - 5.11 MIL/uL   Hemoglobin 8.3 (*) 12.0 - 15.0 g/dL   HCT 40.9 (*) 81.1 - 91.4 %   MCV 88.1  78.0 - 100.0 fL   MCH 29.9  26.0 - 34.0 pg   MCHC 33.9  30.0 - 36.0 g/dL   RDW 78.2  95.6 - 21.3 %   Platelets 245  150 - 400 K/uL  DIFFERENTIAL     Status: None   Collection Time    11/15/12  3:30 AM      Result Value Range   Neutrophils Relative % 64  43 - 77 %   Neutro Abs 2.9  1.7 - 7.7 K/uL   Lymphocytes Relative 27  12 - 46 %   Lymphs Abs 1.2  0.7 - 4.0 K/uL   Monocytes Relative 6  3 - 12 %   Monocytes Absolute 0.3  0.1 - 1.0 K/uL   Eosinophils Relative 3  0 - 5 %   Eosinophils Absolute 0.1  0.0 - 0.7 K/uL   Basophils Relative 0  0 - 1 %   Basophils Absolute 0.0  0.0 - 0.1 K/uL  GLUCOSE, CAPILLARY     Status: Abnormal   Collection Time    11/15/12  5:59 AM      Result Value Range   Glucose-Capillary 107 (*) 70 - 99 mg/dL    Radiology/Results: No results found.  Anti-infectives: Anti-infectives   Start     Dose/Rate Route Frequency Ordered Stop   11/08/12 1030  piperacillin-tazobactam (ZOSYN) IVPB 3.375 g     3.375 g 12.5 mL/hr over 240 Minutes Intravenous 3 times per day 11/08/12 1025        Assessment/Plan: Problem List: Patient Active Problem List   Diagnosis Date Noted  . Breech presentation without mention of version, delivered 11/06/2012  . Cholelithiasis 11/06/2012  . IUP  (intrauterine pregnancy), incidental 03/18/2012  . OVERWEIGHT 08/08/2007  . MIGRAINE HEADACHE 02/28/2007    Passing flatus.  Will start clear liquids.  7 Days Post-Op    LOS: 7 days   Matt B. Daphine Deutscher, MD, [facility] [phone] beeper [phone]  11/15/2012 10:33 AM

## 2012-11-15 NOTE — Progress Notes (Signed)
PARENTERAL NUTRITION CONSULT NOTE - FOLLOW-UP  Pharmacy Consult for TNA Indication: prolonged ileus  No Known Allergies  Patient Measurements: Height: 5\' 7"  (170.2 cm) Weight: 278 lb 7.1 oz (126.3 kg) IBW/kg (Calculated) : 61.6 Adjusted Body Weight: 81 kg  Vital Signs: Temp: 98.3 F (36.8 C) (06/14 0503) Temp src: Oral (06/14 0503) BP: 120/82 mmHg (06/14 0503) Pulse Rate: 81 (06/14 0503) Intake/Output from previous day: 06/13 0701 - 06/14 0700 In: 2276.7 [I.V.:1513.3; IV Piggyback:150; TPN:613.3] Out: 2850 [Urine:2850] Intake/Output from this shift:    Labs:  Recent Labs  11/15/12 0330  WBC 4.6  HGB 8.3*  HCT 24.5*  PLT 245     Recent Labs  11/13/12 0534 11/15/12 0330  NA 138 138  K 4.0 2.9*  CL 103 106  CO2 26 26  GLUCOSE 116* 107*  BUN 6 3*  CREATININE 0.68 0.49*  CALCIUM 8.4 8.2*  MG  --  1.7  PHOS  --  2.8  PROT  --  5.0*  ALBUMIN  --  1.9*  AST  --  8  ALT  --  5  ALKPHOS  --  68  BILITOT  --  0.3   Estimated Creatinine Clearance: 140.7 ml/min (by C-G formula based on Cr of 0.49).    Recent Labs  11/14/12 2351 11/15/12 0559  GLUCAP 101* 107*    Medical History: Past Medical History  Diagnosis Date  . Gallstones   . Cholecystitis chronic, acute   . Heart murmur     no indications for 7-33yrs per pt.  . Asthma     childhood exercise induced only  . Headache(784.0)     migraines    Assessment:  67 yoF s/p cesarean section 6/2 admitted 6/7 with perforated cecum required emergent exploratory laparotomy with R colectomy. Pt developed post-op ileus with significant nausea & vomiting 6/12 and no return of bowel function.  TNA started 6/13.  Pt is currently breastfeeding.  No issues with infusion per RN, pt tolerating ok.   D#2 TNA  Access Site: PICC right basilic  Nutritional Goals:  - RD recommendations: Kcal: 2220-2530, Protein: 93-108 grams, Fluid: 3.6-4 L - Clinimix E 5/20 @ 80 ml/hr + IVF 20% at 10 ml/hr will provide: 96 g  protein, 2169 Kcal daily  Current nutrition:  - Diet: NPO except for sips/chips - TNA: E5/20 @ 37ml/hr, FE @ 10 ml/hr - mIVF: D5-NS + KCl 20 mEq/L @ 60 ml/hr  CBGs & Insulin requirements past 24 hours:  - Controlled, no SSI given  Labs: Electrolytes: K 2.9, other lytes and corrected Ca WNL Renal Function: Scr wnl/stable. UOP 0.9 ml/kg/hr Hepatic Function: wnl on 6/7, labs in AM Pre-Albumin: pending tomorrow Triglycerides: pending tomorrow CBGs: controlled, no h/o DM   Plan:  Continue Clinimix E 5/20 @ 40 ml/hr until labs normalize  IV fat emulsion 20% at 10 ml/hr daily  Standard multivitamins and trace elements daily  KCl x 6   Continue sensitive SSI q6h  TNA labs Monday/Thursdays  Pharmacy will follow up daily  Haynes Hoehn, PharmD 11/15/2012 8:43 AM  Pager: [phone]

## 2012-11-15 NOTE — Progress Notes (Signed)
Patient ID: [name] [name], female   DOB: Nov 28, 1980, 32 y.o.   MRN: [id number] I visited the pt and she reports she feels gas traveling but she is uncertain if she is passing flatus

## 2012-11-16 LAB — GLUCOSE, CAPILLARY
Glucose-Capillary: 104 mg/dL — ABNORMAL HIGH (ref 70–99)
Glucose-Capillary: 110 mg/dL — ABNORMAL HIGH (ref 70–99)
Glucose-Capillary: 117 mg/dL — ABNORMAL HIGH (ref 70–99)

## 2012-11-16 LAB — BASIC METABOLIC PANEL
Chloride: 107 mEq/L (ref 96–112)
GFR calc Af Amer: >90 mL/min (ref >90)
Potassium: 3.8 mEq/L (ref 3.5–5.1)

## 2012-11-16 MED ORDER — TRACE MINERALS CR-CU-F-FE-I-MN-MO-SE-ZN IV SOLN
INTRAVENOUS | Status: AC
  Administered 2012-11-16: 17:00:00 via INTRAVENOUS
  Filled 2012-11-16: qty 2000

## 2012-11-16 MED ORDER — FAT EMULSION 20 % IV EMUL
240.0000 mL | INTRAVENOUS | Status: AC
  Administered 2012-11-16: 240 mL via INTRAVENOUS
  Filled 2012-11-16: qty 250

## 2012-11-16 NOTE — Progress Notes (Signed)
PARENTERAL NUTRITION CONSULT NOTE - FOLLOW-UP  Pharmacy Consult for TNA Indication: prolonged ileus  No Known Allergies  Patient Measurements: Height: 5\' 7"  (170.2 cm) Weight: 278 lb 7.1 oz (126.3 kg) IBW/kg (Calculated) : 61.6 Adjusted Body Weight: 81 kg  Vital Signs: Temp: 98.2 F (36.8 C) (06/15 0500) Temp src: Oral (06/15 0500) BP: 133/75 mmHg (06/15 0500) Pulse Rate: 66 (06/15 0500) Intake/Output from previous day: 06/14 0701 - 06/15 0700 In: 2768.8 [P.O.:120; I.V.:1363; IV Piggyback:100; TPN:1185.8] Out: 5027 [Urine:4900; Drains:125; Stool:2] Intake/Output from this shift:    Labs:  Recent Labs  11/15/12 0330  WBC 4.6  HGB 8.3*  HCT 24.5*  PLT 245     Recent Labs  11/15/12 0330  NA 138  K 2.9*  CL 106  CO2 26  GLUCOSE 107*  BUN 3*  CREATININE 0.49*  CALCIUM 8.2*  MG 1.7  PHOS 2.8  PROT 5.0*  ALBUMIN 1.9*  AST 8  ALT 5  ALKPHOS 68  BILITOT 0.3  PREALBUMIN 13.7*  TRIG 200*   Estimated Creatinine Clearance: 140.7 ml/min (by C-G formula based on Cr of 0.49).    Recent Labs  11/15/12 1228 11/15/12 1731 11/16/12 0012  GLUCAP 108* 78 110*    Medical History: Past Medical History  Diagnosis Date  . Gallstones   . Cholecystitis chronic, acute   . Heart murmur     no indications for 7-48yrs per pt.  . Asthma     childhood exercise induced only  . Headache(784.0)     migraines    Assessment:  57 yoF s/p cesarean section 6/2 admitted 6/7 with perforated cecum required emergent exploratory laparotomy with R colectomy. Pt developed post-op ileus with significant nausea & vomiting 6/12 and no return of bowel function.  TNA started 6/13.    6/15: D#3 TNA. Pt is currently breastfeeding.  No issues with infusion per RN, pt tolerating ok.  Per pt, passing flatus, BM, and tolerating liquids.  Diet advanced to full liquids.    Access Site: PICC right basilic  Nutritional Goals:  - RD recommendations: Kcal: 2220-2530, Protein: 93-108 grams,  Fluid: 3.6-4 L - Clinimix E 5/20 @ 80 ml/hr + IVF 20% at 10 ml/hr will provide: 96 g protein, 2169 Kcal daily  Current nutrition:  - Diet: Full liquid diet - TNA: E5/20 @ 60ml/hr, FE @ 10 ml/hr - mIVF: D5-NS + KCl 20 mEq/L @ 60 ml/hr - Supplemented with KCl x 6 on 6/14  CBGs & Insulin requirements past 24 hours:  - Controlled, no SSI given  Labs: Electrolytes: K improved to WNL, other lytes and corrected Ca WNL Renal Function: Scr wnl/stable. UOP 0.9 ml/kg/hr Hepatic Function: wnl  Pre-Albumin: 13.7 (6/14) Triglycerides: Elevated @ 200 (6/14) CBGs: controlled, no h/o DM   Plan:  Increase Clinimix E 5/20 to 60 ml/hr (goal 80)  Continue IV fat emulsion 20% at 10 ml/hr daily  Follow TG trend, recheck 6/16, limit FE to once weekly if TGs become > 400mg /dL  Standard multivitamins and trace elements daily  Continue sensitive SSI q6h  TNA labs Monday/Thursdays  Pharmacy will follow up daily  Haynes Hoehn, PharmD 11/16/2012 7:26 AM  Pager: [phone]

## 2012-11-16 NOTE — Progress Notes (Signed)
Patient ID: [name] [name], female   DOB: Sep 22, 1980, 32 y.o.   MRN: [id number] [facility] Progress Note:   8 Days Post-Op  Subjective: Mental status is clear.  No complaints this am. Objective: Vital signs in last 24 hours: Temp:  [98.2 F (36.8 C)-98.9 F (37.2 C)] 98.2 F (36.8 C) (06/15 0500) Pulse Rate:  [62-67] 66 (06/15 0500) Resp:  [14-22] 14 (06/15 0743) BP: (112-133)/(73-75) 133/75 mmHg (06/15 0500) SpO2:  [92 %-98 %] 97 % (06/15 0743) FiO2 (%):  [30 %-33 %] 33 % (06/15 0543)  Intake/Output from previous day: 06/14 0701 - 06/15 0700 In: 2768.8 [P.O.:120; I.V.:1363; IV Piggyback:100; TPN:1185.8] Out: 5027 [Urine:4900; Drains:125; Stool:2] Intake/Output this shift:    Physical Exam: Work of breathing is not labored.  O2 in place.  No pain.  Tolerating clear liquid diet  Lab Results:  Results for orders placed during the hospital encounter of 11/08/12 (from the past 48 hour(s))  GLUCOSE, CAPILLARY     Status: Abnormal   Collection Time    11/14/12 11:51 PM      Result Value Range   Glucose-Capillary 101 (*) 70 - 99 mg/dL  COMPREHENSIVE METABOLIC PANEL     Status: Abnormal   Collection Time    11/15/12  3:30 AM      Result Value Range   Sodium 138  135 - 145 mEq/L   Potassium 2.9 (*) 3.5 - 5.1 mEq/L   Comment: DELTA CHECK NOTED     REPEATED TO VERIFY   Chloride 106  96 - 112 mEq/L   CO2 26  19 - 32 mEq/L   Glucose, Bld 107 (*) 70 - 99 mg/dL   BUN 3 (*) 6 - 23 mg/dL   Creatinine, Ser 4.09 (*) 0.50 - 1.10 mg/dL   Calcium 8.2 (*) 8.4 - 10.5 mg/dL   Total Protein 5.0 (*) 6.0 - 8.3 g/dL   Albumin 1.9 (*) 3.5 - 5.2 g/dL   AST 8  0 - 37 U/L   ALT 5  0 - 35 U/L   Alkaline Phosphatase 68  39 - 117 U/L   Total Bilirubin 0.3  0.3 - 1.2 mg/dL   GFR calc non Af Amer >90  >90 mL/min   GFR calc Af Amer >90  >90 mL/min   Comment:            The eGFR has been calculated     using the CKD EPI equation.     This calculation has not been     validated in all  clinical     situations.     eGFR's persistently     <90 mL/min signify     possible Chronic Kidney Disease.  PREALBUMIN     Status: Abnormal   Collection Time    11/15/12  3:30 AM      Result Value Range   Prealbumin 13.7 (*) 17.0 - 34.0 mg/dL  MAGNESIUM     Status: None   Collection Time    11/15/12  3:30 AM      Result Value Range   Magnesium 1.7  1.5 - 2.5 mg/dL  PHOSPHORUS     Status: None   Collection Time    11/15/12  3:30 AM      Result Value Range   Phosphorus 2.8  2.3 - 4.6 mg/dL  TRIGLYCERIDES     Status: Abnormal   Collection Time    11/15/12  3:30 AM      Result Value Range  Triglycerides 200 (*) <150 mg/dL  CBC     Status: Abnormal   Collection Time    11/15/12  3:30 AM      Result Value Range   WBC 4.6  4.0 - 10.5 K/uL   RBC 2.78 (*) 3.87 - 5.11 MIL/uL   Hemoglobin 8.3 (*) 12.0 - 15.0 g/dL   HCT 45.4 (*) 09.8 - 11.9 %   MCV 88.1  78.0 - 100.0 fL   MCH 29.9  26.0 - 34.0 pg   MCHC 33.9  30.0 - 36.0 g/dL   RDW 14.7  82.9 - 56.2 %   Platelets 245  150 - 400 K/uL  DIFFERENTIAL     Status: None   Collection Time    11/15/12  3:30 AM      Result Value Range   Neutrophils Relative % 64  43 - 77 %   Neutro Abs 2.9  1.7 - 7.7 K/uL   Lymphocytes Relative 27  12 - 46 %   Lymphs Abs 1.2  0.7 - 4.0 K/uL   Monocytes Relative 6  3 - 12 %   Monocytes Absolute 0.3  0.1 - 1.0 K/uL   Eosinophils Relative 3  0 - 5 %   Eosinophils Absolute 0.1  0.0 - 0.7 K/uL   Basophils Relative 0  0 - 1 %   Basophils Absolute 0.0  0.0 - 0.1 K/uL  GLUCOSE, CAPILLARY     Status: Abnormal   Collection Time    11/15/12  5:59 AM      Result Value Range   Glucose-Capillary 107 (*) 70 - 99 mg/dL  GLUCOSE, CAPILLARY     Status: Abnormal   Collection Time    11/15/12 12:28 PM      Result Value Range   Glucose-Capillary 108 (*) 70 - 99 mg/dL  GLUCOSE, CAPILLARY     Status: None   Collection Time    11/15/12  5:31 PM      Result Value Range   Glucose-Capillary 78  70 - 99 mg/dL   GLUCOSE, CAPILLARY     Status: Abnormal   Collection Time    11/16/12 12:12 AM      Result Value Range   Glucose-Capillary 110 (*) 70 - 99 mg/dL   Comment 1 Notify RN      Radiology/Results: No results found.  Anti-infectives: Anti-infectives   Start     Dose/Rate Route Frequency Ordered Stop   11/08/12 1030  piperacillin-tazobactam (ZOSYN) IVPB 3.375 g     3.375 g 12.5 mL/hr over 240 Minutes Intravenous 3 times per day 11/08/12 1025        Assessment/Plan: Problem List: Patient Active Problem List   Diagnosis Date Noted  . Breech presentation without mention of version, delivered 11/06/2012  . Cholelithiasis 11/06/2012  . IUP (intrauterine pregnancy), incidental 03/18/2012  . OVERWEIGHT 08/08/2007  . MIGRAINE HEADACHE 02/28/2007    Improved.  Advance to full liquid diet. 8 Days Post-Op    LOS: 8 days   Matt B. Daphine Deutscher, MD, [facility] [phone] beeper [phone]  11/16/2012 8:44 AM

## 2012-11-16 NOTE — Progress Notes (Signed)
Patient ID: [name] [name], female   DOB: 1981/05/06, 32 y.o.   MRN: [id number] I visited the pt and she reports passing flatus and forming BM and tolerating liquids

## 2012-11-17 LAB — COMPREHENSIVE METABOLIC PANEL
ALT: 8 U/L (ref 0–35)
AST: 11 U/L (ref 0–37)
Albumin: 2.3 g/dL — ABNORMAL LOW (ref 3.5–5.2)
Alkaline Phosphatase: 78 U/L (ref 39–117)
Chloride: 109 mEq/L (ref 96–112)
Creatinine, Ser: 0.52 mg/dL (ref 0.50–1.10)
Potassium: 3.4 mEq/L — ABNORMAL LOW (ref 3.5–5.1)
Sodium: 141 mEq/L (ref 135–145)
Total Bilirubin: 0.3 mg/dL (ref 0.3–1.2)

## 2012-11-17 LAB — CBC
HCT: 29.2 % — ABNORMAL LOW (ref 36.0–46.0)
Hemoglobin: 9.3 g/dL — ABNORMAL LOW (ref 12.0–15.0)
MCHC: 31.8 g/dL (ref 30.0–36.0)

## 2012-11-17 LAB — PREALBUMIN: Prealbumin: 21.1 mg/dL (ref 17.0–34.0)

## 2012-11-17 LAB — DIFFERENTIAL
Basophils Relative: 0 % (ref 0–1)
Eosinophils Absolute: 0.1 10*3/uL (ref 0.0–0.7)
Lymphocytes Relative: 18 % (ref 12–46)
Neutro Abs: 4.6 10*3/uL (ref 1.7–7.7)

## 2012-11-17 LAB — PHOSPHORUS: Phosphorus: 4 mg/dL (ref 2.3–4.6)

## 2012-11-17 LAB — GLUCOSE, CAPILLARY: Glucose-Capillary: 100 mg/dL — ABNORMAL HIGH (ref 70–99)

## 2012-11-17 LAB — TRIGLYCERIDES: Triglycerides: 208 mg/dL — ABNORMAL HIGH (ref <150)

## 2012-11-17 NOTE — Progress Notes (Signed)
11/17/2012 Colleen Can BSN RN CCM [phone] Tct [employer]-regarding whether authorization had been obtained. Spoke with [place] [phone]) who will research and call me back.

## 2012-11-17 NOTE — Progress Notes (Signed)
Patient ID: [name] [name], female   DOB: 1980-07-07, 32 y.o.   MRN: [id number] I examined her c section incision and it has healed. I advised her to return to my office in 4 weeks for a 6 week post c section check up.

## 2012-11-17 NOTE — Progress Notes (Signed)
9 Days Post-Op  Subjective: One episode of vomiting overnight and some left sided cramping but feels better today.  Objective: Vital signs in last 24 hours: Temp:  [97.9 F (36.6 C)-98.4 F (36.9 C)] 98.4 F (36.9 C) (06/16 0600) Pulse Rate:  [55-79] 79 (06/16 0600) Resp:  [18-22] 20 (06/16 0600) BP: (117-132)/(78-88) 117/78 mmHg (06/16 0600) SpO2:  [96 %-98 %] 97 % (06/16 0600) Last BM Date: 11/16/12  Intake/Output from previous day: 06/15 0701 - 06/16 0700 In: 3565.2 [I.V.:1997; IV Piggyback:100; TPN:1468.2] Out: 2600 [Urine:2500; Drains:100] Intake/Output this shift:    GI: soft, nontender. good bs. vac intact  Lab Results:   Recent Labs  11/15/12 0330 11/17/12 0440  WBC 4.6 6.1  HGB 8.3* 9.3*  HCT 24.5* 29.2*  PLT 245 302   BMET  Recent Labs  11/16/12 0755 11/17/12 0440  NA 141 141  K 3.8 3.4*  CL 107 109  CO2 24 24  GLUCOSE 127* 114*  BUN 3* 5*  CREATININE 0.52 0.52  CALCIUM 8.8 8.5   PT/INR No results found for this basename: LABPROT, INR,  in the last 72 hours ABG No results found for this basename: PHART, PCO2, PO2, HCO3,  in the last 72 hours  Studies/Results: No results found.  Anti-infectives: Anti-infectives   Start     Dose/Rate Route Frequency Ordered Stop   11/08/12 1030  piperacillin-tazobactam (ZOSYN) IVPB 3.375 g     3.375 g 12.5 mL/hr over 240 Minutes Intravenous 3 times per day 11/08/12 1025        Assessment/Plan: s/p Procedure(s): laparoscopic exploration and EXPLORATORY LAPAROTOMY (N/A) Continue full liquids for now Stop TPN after current bag ambulate  LOS: 9 days    TOTH III,Hisao Doo S 11/17/2012

## 2012-11-18 MED ORDER — OXYCODONE-ACETAMINOPHEN 5-325 MG PO TABS
1.0000 | ORAL_TABLET | ORAL | Status: DC | PRN
  Administered 2012-11-19: 2 via ORAL
  Filled 2012-11-18: qty 2

## 2012-11-18 MED ORDER — MORPHINE SULFATE 2 MG/ML IJ SOLN: 1.0000 mg | INTRAMUSCULAR | Status: DC | PRN

## 2012-11-18 NOTE — Progress Notes (Signed)
Patient ID: [name] [name], female   DOB: 02/19/1981, 32 y.o.   MRN: [id number] 10 Days Post-Op  Subjective: Feeling better today, +BS and flatus, denies n/v, tolerating dressing changes, not using PCA much at all.  Walking well.  Objective: Vital signs in last 24 hours: Temp:  [98.6 F (37 C)-99.8 F (37.7 C)] 98.6 F (37 C) (06/17 0549) Pulse Rate:  [67-78] 77 (06/17 0549) Resp:  [13-25] 18 (06/17 0720) BP: (107-116)/(46-72) 111/72 mmHg (06/17 0549) SpO2:  [94 %-100 %] 100 % (06/17 0720) Last BM Date: 11/18/12  Intake/Output from previous day: 06/16 0701 - 06/17 0700 In: 2640 [P.O.:480; I.V.:1450; IV Piggyback:150; TPN:560] Out: 2025 [Urine:1975; Drains:50] Intake/Output this shift:   General: NAD, walking in room, good spirits GI: soft, nontender. good bs. vac intact, serosang drainage in cannister.  Lab Results:   Recent Labs  11/17/12 0440  WBC 6.1  HGB 9.3*  HCT 29.2*  PLT 302   BMET  Recent Labs  11/16/12 0755 11/17/12 0440  NA 141 141  K 3.8 3.4*  CL 107 109  CO2 24 24  GLUCOSE 127* 114*  BUN 3* 5*  CREATININE 0.52 0.52  CALCIUM 8.8 8.5   PT/INR No results found for this basename: LABPROT, INR,  in the last 72 hours ABG No results found for this basename: PHART, PCO2, PO2, HCO3,  in the last 72 hours  Studies/Results: No results found.  Anti-infectives: Anti-infectives   Start     Dose/Rate Route Frequency Ordered Stop   11/08/12 1030  piperacillin-tazobactam (ZOSYN) IVPB 3.375 g     3.375 g 12.5 mL/hr over 240 Minutes Intravenous 3 times per day 11/08/12 1025        Assessment/Plan: 1. S/p c-section, POD#15  2. S/p ,ex lap with right colectomy for colonic ileus with serosal tear - 11/08/2012 - P. Toth POD#9   --wound vac changes MWF   --home health for vac changes  --likley home tomorrow. 3. Post op ileus: resolving, regular diet today    LOS: 10 days    [name], [name] 11/18/2012

## 2012-11-18 NOTE — Progress Notes (Signed)
Discussed in long length of stay rounds. 

## 2012-11-19 MED ORDER — OXYCODONE-ACETAMINOPHEN 5-325 MG PO TABS: 1.0000 | ORAL_TABLET | ORAL | Status: DC | PRN

## 2012-11-19 NOTE — Discharge Summary (Signed)
  Physician Discharge Summary  Patient ID: [name] [name] MRN: [id number] DOB/AGE: 06/11/1980 31 y.o.  Admit date: 11/08/2012 Discharge date: 11/19/2012  Admitting Diagnosis: Perforated Cecum Abdominal pain  Discharge Diagnosis Patient Active Problem List   Diagnosis Date Noted  . Breech presentation without mention of version, delivered 11/06/2012  . Cholelithiasis 11/06/2012  . IUP (intrauterine pregnancy), incidental 03/18/2012  . OVERWEIGHT 08/08/2007  . MIGRAINE HEADACHE 02/28/2007    Consultants None  Procedures Exploratory Laparotomy with right colectomy 11/08/12  Hospital Course:  32 yo wf who was 5 days s/p c section. She was doing well until about 3 am that morning when she developed severe abdominal pain. No fever. No vomiting. She came to ER and xrays showed significant free air that was not there 3 days ago.  She was taken emergently to the OR and underwent the procedure above.  She tolerated the procedure well.  Post-operatively the patient had a signficant post-operative ileus and required a NGT and eventually PICC with TNA.  Her wound was left open and a wound vac was placed to help facilitate healing.  Her ileus did resolve and at time of discharge she was tolerating a regular diet, walking well, pain well controlled and wound was healthy.  Physical Exam: VSS afebrile General: awake, nad Abd: soft, mildly tender over midline but overall benign, non distended, +BS, wound healthy with good granulation tissue    Medication List    TAKE these medications       bisacodyl 5 MG EC tablet  Commonly known as:  DULCOLAX  Take 10 mg by mouth daily as needed for constipation.     ibuprofen 600 MG tablet  Commonly known as:  ADVIL,MOTRIN  Take 1 tablet (600 mg total) by mouth every 6 (six) hours as needed for pain.     omeprazole 40 MG capsule  Commonly known as:  PRILOSEC  Take 40 mg by mouth daily.     oxyCODONE-acetaminophen 5-325 MG per tablet  Commonly known  as:  PERCOCET/ROXICET  Take 1-2 tablets by mouth every 4 (four) hours as needed.             Follow-up Information   Follow up with [name] Askew, MD. Schedule an appointment as soon as possible for a visit in 2 weeks.   Contact information:   [address] [place] [address] [phone]       Signed: Denny Levy [facility] [phone]  11/19/2012, 8:29 AM

## 2012-11-19 NOTE — Discharge Instructions (Signed)
[facility]      [facility] [phone]  OPEN ABDOMINAL SURGERY: POST OP INSTRUCTIONS  Always review your discharge instruction sheet given to you by the facility where your surgery was performed.  IF YOU HAVE DISABILITY OR FAMILY LEAVE FORMS, YOU MUST BRING THEM TO THE OFFICE FOR PROCESSING.  PLEASE DO NOT GIVE THEM TO YOUR DOCTOR.  1. A prescription for pain medication may be given to you upon discharge.  Take your pain medication as prescribed, if needed.  If narcotic pain medicine is not needed, then you may take acetaminophen (Tylenol) or ibuprofen (Advil) as needed. 2. Take your usually prescribed medications unless otherwise directed. 3. If you need a refill on your pain medication, please contact your pharmacy. They will contact our office to request authorization.  Prescriptions will not be filled after 5pm or on week-ends. 4. You should follow a light diet the first few days after arrival home, such as soup and crackers, pudding, etc.unless your doctor has advised otherwise. A high-fiber, low fat diet can be resumed as tolerated.   Be sure to include lots of fluids daily. Most patients will experience some swelling and bruising on the chest and neck area.  Ice packs will help.  Swelling and bruising can take several days to resolve 5. Most patients will experience some swelling and bruising in the area of the incision. Ice pack will help. Swelling and bruising can take several days to resolve..  6. It is common to experience some constipation if taking pain medication after surgery.  Increasing fluid intake and taking a stool softener will usually help or prevent this problem from occurring.  A mild laxative (Milk of Magnesia or Miralax) should be taken according to package directions if there are no bowel movements after 48 hours. 7.  You may have steri-strips (small skin tapes) in place directly over the incision.  These strips should be left on the skin for 7-10 days.  If your  surgeon used skin glue on the incision, you may shower in 24 hours.  The glue will flake off over the next 2-3 weeks.  Any sutures or staples will be removed at the office during your follow-up visit. You may find that a light gauze bandage over your incision may keep your staples from being rubbed or pulled. You may shower and replace the bandage daily. 8. ACTIVITIES:  You may resume regular (light) daily activities beginning the next day--such as daily self-care, walking, climbing stairs--gradually increasing activities as tolerated.  You may have sexual intercourse when it is comfortable.  Refrain from any heavy lifting or straining until approved by your doctor. a. You may drive when you no longer are taking prescription pain medication, you can comfortably wear a seatbelt, and you can safely maneuver your car and apply brakes b. Return to Work: ___________________________________ 9. You should see your doctor in the office for a follow-up appointment approximately two weeks after your surgery.  Make sure that you call for this appointment within a day or two after you arrive home to insure a convenient appointment time. OTHER INSTRUCTIONS:  _____________________________________________________________ _____________________________________________________________  WHEN TO CALL YOUR DOCTOR: 1. Fever over 101.0 2. Inability to urinate 3. Nausea and/or vomiting 4. Extreme swelling or bruising 5. Continued bleeding from incision. 6. Increased pain, redness, or drainage from the incision. 7. Difficulty swallowing or breathing 8. Muscle cramping or spasms. 9. Numbness or tingling in hands or feet or around lips.  The clinic staff is available to   answer your questions during regular business hours.  Please don't hesitate to call and ask to speak to one of the nurses if you have concerns.  For further questions, please visit [email or web address]   

## 2012-11-19 NOTE — Progress Notes (Signed)
Wound vac changed by Clance Boll, Pa and placed to home wound vac machine. Rx for percocet given. Patient instructed that per PA ok to for baby to have breast milk. States understanding of discharge instructions.

## 2012-11-21 ENCOUNTER — Other Ambulatory Visit (INDEPENDENT_AMBULATORY_CARE_PROVIDER_SITE_OTHER): Payer: Self-pay

## 2012-11-21 ENCOUNTER — Telehealth (INDEPENDENT_AMBULATORY_CARE_PROVIDER_SITE_OTHER): Payer: Self-pay

## 2012-11-21 DIAGNOSIS — R112 Nausea with vomiting, unspecified: Secondary | ICD-10-CM

## 2012-11-21 MED ORDER — PROMETHAZINE HCL 12.5 MG PO TABS: 12.5000 mg | ORAL_TABLET | Freq: Four times a day (QID) | ORAL | Status: DC | PRN

## 2012-11-21 NOTE — Telephone Encounter (Signed)
Pt called stating she vomited 4 times yesterday afternoon after drinking a milk protein shake. Pt states she is tolerating liquids now but still having nausea. Pt having normal BM twice a day. No fever. Voiding well without pain. Reviewed with Dr Carolynne Edouard via phone. Per Dr Carolynne Edouard order placed for phenergan 12.5mg  #10 one po q 6 h prn for nausea with one refill. Sent via epic to [facility]. LMOM for pt to continue liquid diet until nausea improves and rx sent to [facility] to fill.

## 2012-11-24 NOTE — Progress Notes (Deleted)
error 

## 2012-12-09 ENCOUNTER — Encounter (INDEPENDENT_AMBULATORY_CARE_PROVIDER_SITE_OTHER): Payer: Self-pay | Admitting: General Surgery

## 2012-12-09 ENCOUNTER — Ambulatory Visit (INDEPENDENT_AMBULATORY_CARE_PROVIDER_SITE_OTHER): Payer: Medicaid Other | Admitting: General Surgery

## 2012-12-09 ENCOUNTER — Telehealth: Payer: Self-pay | Admitting: *Deleted

## 2012-12-09 VITALS — BP 138/80 | HR 72 | Temp 97.7°F | Resp 16 | Ht 65.0 in | Wt 240.0 lb

## 2012-12-09 DIAGNOSIS — K631 Perforation of intestine (nontraumatic): Secondary | ICD-10-CM

## 2012-12-09 DIAGNOSIS — C7A020 Malignant carcinoid tumor of the appendix: Secondary | ICD-10-CM | POA: Insufficient documentation

## 2012-12-09 NOTE — Progress Notes (Signed)
Subjective:     Patient ID: [name] [name], female   DOB: 1981-05-17, 32 y.o.   MRN: [id number]  HPI The patient is a 32 year old white female who is 3 weeks status post right colectomy for a right colon perforation 5 days after her C-section. She has been at home with her new baby and things seem to be improving slowly. Her appetite is getting better. Her bowels are moving normally. She has had a vacuum dressing on her midline wound. 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 I believe this is unrelated to the perforation.  Review of Systems     Objective:   Physical Exam On exam her abdomen is soft and nontender. Her midline incision is well granulated small and flat.    Assessment:     The patient is 3 weeks status post right colectomy     Plan:     At this point I would like to discontinue the vacuum dressing. She can shower daily and then cover the wound was clean gauze. We will plan to see her back in 3 weeks to check her progress. We will also refer her to medical oncology to discuss the results of her pathology

## 2012-12-09 NOTE — Patient Instructions (Addendum)
Discontinue the vac. Shower daily with no dressing on then cover with clean gauze Will refer to medical oncology

## 2012-12-09 NOTE — Telephone Encounter (Signed)
Spoke with patient by phone and confirmed appointment with Dr. Truett Perna for 12/22/12.  Contact names and numbers were provided.  Patient was without questions.

## 2012-12-20 ENCOUNTER — Encounter (HOSPITAL_COMMUNITY): Payer: Self-pay

## 2012-12-20 ENCOUNTER — Emergency Department (HOSPITAL_COMMUNITY): Payer: Medicaid Other

## 2012-12-20 ENCOUNTER — Inpatient Hospital Stay (HOSPITAL_COMMUNITY)
Admission: EM | Admit: 2012-12-20 | Discharge: 2012-12-24 | DRG: 418 | Disposition: A | Discharge status: Home / Self Care | Payer: Medicaid Other | Attending: Surgery | Admitting: Surgery

## 2012-12-20 DIAGNOSIS — Z79899 Other long term (current) drug therapy: Secondary | ICD-10-CM

## 2012-12-20 DIAGNOSIS — K8020 Calculus of gallbladder without cholecystitis without obstruction: Secondary | ICD-10-CM

## 2012-12-20 DIAGNOSIS — T4275XA Adverse effect of unspecified antiepileptic and sedative-hypnotic drugs, initial encounter: Secondary | ICD-10-CM | POA: Diagnosis not present

## 2012-12-20 DIAGNOSIS — K8010 Calculus of gallbladder with chronic cholecystitis without obstruction: Secondary | ICD-10-CM

## 2012-12-20 DIAGNOSIS — K851 Biliary acute pancreatitis without necrosis or infection: Secondary | ICD-10-CM

## 2012-12-20 DIAGNOSIS — G4440 Drug-induced headache, not elsewhere classified, not intractable: Secondary | ICD-10-CM | POA: Diagnosis not present

## 2012-12-20 DIAGNOSIS — Z6839 Body mass index (BMI) 39.0-39.9, adult: Secondary | ICD-10-CM

## 2012-12-20 DIAGNOSIS — J45909 Unspecified asthma, uncomplicated: Secondary | ICD-10-CM | POA: Diagnosis not present

## 2012-12-20 DIAGNOSIS — E876 Hypokalemia: Secondary | ICD-10-CM | POA: Diagnosis not present

## 2012-12-20 DIAGNOSIS — K319 Disease of stomach and duodenum, unspecified: Secondary | ICD-10-CM | POA: Diagnosis present

## 2012-12-20 DIAGNOSIS — K8000 Calculus of gallbladder with acute cholecystitis without obstruction: Secondary | ICD-10-CM | POA: Diagnosis present

## 2012-12-20 DIAGNOSIS — K859 Acute pancreatitis without necrosis or infection, unspecified: Principal | ICD-10-CM | POA: Diagnosis present

## 2012-12-20 DIAGNOSIS — K219 Gastro-esophageal reflux disease without esophagitis: Secondary | ICD-10-CM | POA: Diagnosis present

## 2012-12-20 DIAGNOSIS — I498 Other specified cardiac arrhythmias: Secondary | ICD-10-CM | POA: Diagnosis present

## 2012-12-20 DIAGNOSIS — G43909 Migraine, unspecified, not intractable, without status migrainosus: Secondary | ICD-10-CM

## 2012-12-20 DIAGNOSIS — Z791 Long term (current) use of non-steroidal anti-inflammatories (NSAID): Secondary | ICD-10-CM

## 2012-12-20 DIAGNOSIS — E663 Overweight: Secondary | ICD-10-CM

## 2012-12-20 DIAGNOSIS — C7A020 Malignant carcinoid tumor of the appendix: Secondary | ICD-10-CM

## 2012-12-20 DIAGNOSIS — D3A020 Benign carcinoid tumor of the appendix: Secondary | ICD-10-CM | POA: Diagnosis present

## 2012-12-20 LAB — COMPREHENSIVE METABOLIC PANEL
Albumin: 4 g/dL (ref 3.5–5.2)
Alkaline Phosphatase: 512 U/L — ABNORMAL HIGH (ref 39–117)
BUN: 8 mg/dL (ref 6–23)
Calcium: 10 mg/dL (ref 8.4–10.5)
Creatinine, Ser: 0.49 mg/dL — ABNORMAL LOW (ref 0.50–1.10)
GFR calc Af Amer: >90 mL/min (ref >90)
Glucose, Bld: 137 mg/dL — ABNORMAL HIGH (ref 70–99)
Total Protein: 8.1 g/dL (ref 6.0–8.3)

## 2012-12-20 LAB — URINALYSIS, ROUTINE W REFLEX MICROSCOPIC
Glucose, UA: NEGATIVE mg/dL
Ketones, ur: 40 mg/dL — AB
Nitrite: NEGATIVE
Specific Gravity, Urine: 1.024 (ref 1.005–1.030)
pH: 5 (ref 5.0–8.0)

## 2012-12-20 LAB — CBC WITH DIFFERENTIAL/PLATELET
Basophils Absolute: 0 10*3/uL (ref 0.0–0.1)
Basophils Relative: 0 % (ref 0–1)
Eosinophils Absolute: 0 10*3/uL (ref 0.0–0.7)
Eosinophils Relative: 0 % (ref 0–5)
HCT: 38.2 % (ref 36.0–46.0)
Hemoglobin: 12.6 g/dL (ref 12.0–15.0)
MCH: 29.1 pg (ref 26.0–34.0)
MCHC: 33 g/dL (ref 30.0–36.0)
Monocytes Absolute: 0.3 10*3/uL (ref 0.1–1.0)
Monocytes Relative: 3 % (ref 3–12)
Neutro Abs: 8.9 10*3/uL — ABNORMAL HIGH (ref 1.7–7.7)
RDW: 14.5 % (ref 11.5–15.5)

## 2012-12-20 LAB — URINE MICROSCOPIC-ADD ON

## 2012-12-20 MED ORDER — SODIUM CHLORIDE 0.9 % IV BOLUS (SEPSIS)
1000.0000 mL | Freq: Once | INTRAVENOUS | Status: AC
  Administered 2012-12-20: 1000 mL via INTRAVENOUS

## 2012-12-20 MED ORDER — PANTOPRAZOLE SODIUM 40 MG PO TBEC
80.0000 mg | DELAYED_RELEASE_TABLET | Freq: Every day | ORAL | Status: DC
  Filled 2012-12-20: qty 2

## 2012-12-20 MED ORDER — ONDANSETRON HCL 4 MG PO TABS: 4.0000 mg | ORAL_TABLET | Freq: Four times a day (QID) | ORAL | Status: DC | PRN

## 2012-12-20 MED ORDER — HYDROMORPHONE HCL PF 1 MG/ML IJ SOLN
1.0000 mg | Freq: Once | INTRAMUSCULAR | Status: AC
Start: 2012-12-20 — End: 2012-12-20
  Administered 2012-12-20: 1 mg via INTRAVENOUS
  Filled 2012-12-20: qty 1

## 2012-12-20 MED ORDER — DEXTROSE-NACL 5-0.9 % IV SOLN
INTRAVENOUS | Status: DC
Start: 2012-12-20 — End: 2012-12-22
  Administered 2012-12-20 – 2012-12-21 (×2): via INTRAVENOUS

## 2012-12-20 MED ORDER — ONDANSETRON HCL 4 MG/2ML IJ SOLN
4.0000 mg | Freq: Once | INTRAMUSCULAR | Status: AC
  Administered 2012-12-20: 4 mg via INTRAVENOUS
  Filled 2012-12-20: qty 2

## 2012-12-20 MED ORDER — DIPHENHYDRAMINE HCL 50 MG/ML IJ SOLN
12.5000 mg | Freq: Once | INTRAMUSCULAR | Status: AC
  Administered 2012-12-20: 12.5 mg via INTRAVENOUS
  Filled 2012-12-20: qty 1

## 2012-12-20 MED ORDER — HYDROMORPHONE HCL PF 1 MG/ML IJ SOLN
1.0000 mg | Freq: Once | INTRAMUSCULAR | Status: AC
  Administered 2012-12-20: 1 mg via INTRAVENOUS
  Filled 2012-12-20: qty 1

## 2012-12-20 MED ORDER — ONDANSETRON HCL 4 MG/2ML IJ SOLN
4.0000 mg | Freq: Four times a day (QID) | INTRAMUSCULAR | Status: DC | PRN
  Administered 2012-12-21: 4 mg via INTRAVENOUS
  Filled 2012-12-20: qty 2

## 2012-12-20 MED ORDER — HYDROMORPHONE HCL PF 1 MG/ML IJ SOLN
1.0000 mg | INTRAMUSCULAR | Status: DC | PRN
  Administered 2012-12-21 (×2): 1 mg via INTRAVENOUS
  Filled 2012-12-20 (×2): qty 1

## 2012-12-20 MED ORDER — HEPARIN SODIUM (PORCINE) 5000 UNIT/ML IJ SOLN
5000.0000 [IU] | Freq: Three times a day (TID) | INTRAMUSCULAR | Status: DC
  Administered 2012-12-20 – 2012-12-23 (×7): 5000 [IU] via SUBCUTANEOUS
  Filled 2012-12-20 (×11): qty 1

## 2012-12-20 NOTE — ED Notes (Signed)
To draw blood - ultrasound in with pt

## 2012-12-20 NOTE — H&P (Signed)
[facility] History and Physical  Chamika Cunanan ZOX:[id number] DOB: September 28, 1980    PCP:   Evette Georges, MD   Chief Complaint: abdominal pain, nausea and vomiting.  HPI: [name] [name] is an 32 y.o. 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  She has no fever or chills, black stool or bloody stool. Evaluation in the ER with US showed dilated CBD to 14mm with dilated intrahepatic ducts and multiple stones.  She also has lipase greater than 3000.and amylase of 1200's along with elevated LFTs in the 400's  With alk phospatase in the 500's and total bili of 2.8.She has no leukocytotis, normal Hb and no fever.  Hospitalist was asked to admit her for gallstone pancreatitis.  Surgery was consulted as well.  Rewiew of Systems:  Constitutional: Negative for malaise, fever and chills. No significant weight loss or weight gain Eyes: Negative for eye pain, redness and discharge, diplopia, visual changes, or flashes of light. ENMT: Negative for ear pain, hoarseness, nasal congestion, sinus pressure and sore throat. No headaches; tinnitus, drooling, or problem swallowing. Cardiovascular: Negative for chest pain, palpitations, diaphoresis, dyspnea and peripheral edema. ; No orthopnea, PND Respiratory: Negative for cough, hemoptysis, wheezing and stridor. No pleuritic chestpain. Gastrointestinal: Negative for melena, blood in stool, hematemesis, jaundice and rectal bleeding.    Genitourinary: Negative for frequency, dysuria, incontinence,flank pain and hematuria; Musculoskeletal: Negative for back pain and neck pain. Negative for swelling and trauma.;  Skin: . Negative for pruritus, rash,  abrasions, bruising and skin lesion.; ulcerations Neuro: Negative for headache, lightheadedness and neck stiffness. Negative for weakness, altered level of consciousness , altered mental status, extremity weakness, burning feet, involuntary movement, seizure and syncope.  Psych: negative for anxiety, depression, insomnia, tearfulness, panic attacks, hallucinations, paranoia, suicidal or homicidal ideation.   Past Medical History  Diagnosis Date  . Gallstones   . Cholecystitis chronic, acute   . Heart murmur     no indications for 7-23yrs per pt.  . Asthma     childhood exercise induced only  . Headache(784.0)     migraines    Past Surgical History  Procedure Laterality Date  . No past surgeries    . Cesarean section N/A 11/03/2012    Procedure: PRIMARY CESAREAN SECTION;  Surgeon: Bing Plume, MD;  Location: [facility];  Service: Obstetrics;  Laterality: N/A;  . Laparotomy N/A 11/08/2012    Procedure: laparoscopic exploration and EXPLORATORY LAPAROTOMY;  Surgeon: Robyne Askew, MD;  Location: [facility];  Service: General;  Laterality: N/A;  . Appendectomy      Medications:  HOME MEDS: Prior to Admission medications   Medication Sig Start Date End Date Taking? Authorizing Provider  diphenhydrAMINE (BENADRYL) 25 mg capsule Take 25 mg by mouth every 6 (six) hours as needed for itching.   Yes Historical Provider, MD  ibuprofen (ADVIL,MOTRIN) 600 MG tablet Take 1 tablet (600 mg total) by mouth every 6 (six) hours as needed for pain. 11/06/12  Yes Bing Plume, MD  omeprazole (PRILOSEC) 40 MG capsule Take 40 mg by mouth daily.   Yes Historical Provider, MD  oxyCODONE-acetaminophen (PERCOCET/ROXICET) 5-325 MG per tablet Take 1-2 tablets by mouth every 4 (four) hours as needed. 11/19/12  Yes Doristine Mango,  PA-C  promethazine (PHENERGAN) 12.5 MG tablet Take 1 tablet (12.5 mg total) by mouth every 6 (six) hours as needed for nausea. 11/21/12  Yes Caleen Essex III, MD     Allergies:  No Known  Allergies  Social History:   reports that she has never smoked. She does not have any smokeless tobacco history on file. She reports that she does not drink alcohol or use illicit drugs.  Family History: Family History  Problem Relation Age of Onset  . ADD / ADHD      family hx  . Breast cancer      fhx  . Mental illness      fhx  . Heart disease      fhx  . Hypertension      fhx     Physical Exam: Filed Vitals:   12/20/12 1830 12/20/12 1930 12/20/12 2000 12/20/12 2030  BP: 132/82 147/84 138/76 150/87  Pulse: 76 74 71 82  Temp:      TempSrc:      Resp: 16 14 15    SpO2: 96% 96% 93% 100%   Blood pressure 150/87, pulse 82, temperature 98.9 F (37.2 C), temperature source Oral, resp. rate 15, SpO2 100.00%.  GEN:  Pleasant  patient lying in the stretcher in no acute distress; cooperative with exam. PSYCH:  alert and oriented x4; does not appear anxious or depressed; affect is appropriate. HEENT: Mucous membranes pink and anicteric; PERRLA; EOM intact; no cervical lymphadenopathy nor thyromegaly or carotid bruit; no JVD; There were no stridor. Neck is very supple. Breasts:: Not examined CHEST WALL: No tenderness CHEST: Normal respiration, clear to auscultation bilaterally.  HEART: Regular rate and rhythm.  There are no murmur, rub, or gallops.   BACK: No kyphosis or scoliosis; no CVA tenderness ABDOMEN: soft slightly tender over the RUQ area. Her wound midabdomen is healing well.; no masses, no organomegaly, normal abdominal bowel sounds; no pannus; no intertriginous candida. There is no rebound and no distention. Rectal Exam: Not done EXTREMITIES: No bone or joint deformity; age-appropriate arthropathy of the hands and knees; no edema; no ulcerations.  There is no calf tenderness. Genitalia: not examined PULSES: 2+ and symmetric SKIN: Normal hydration no rash or ulceration. CNS: Cranial nerves 2-12 grossly intact no focal lateralizing neurologic deficit.  Speech is fluent;  uvula elevated with phonation, facial symmetry and tongue midline. DTR are normal bilaterally, cerebella exam is intact, barbinski is negative and strengths are equaled bilaterally.  No sensory loss.   Labs on Admission:  Basic Metabolic Panel:  Recent Labs Lab 12/20/12 1657  NA 138  K 4.4  CL 100  CO2 24  GLUCOSE 137*  BUN 8  CREATININE 0.49*  CALCIUM 10.0   Liver Function Tests:  Recent Labs Lab 12/20/12 1657  AST 420*  ALT 492*  ALKPHOS 512*  BILITOT 2.8*  PROT 8.1  ALBUMIN 4.0    Recent Labs Lab 12/20/12 1657 12/20/12 1910  LIPASE >3000*  --   AMYLASE  --  1276*   No results found for this basename: AMMONIA,  in the last 168 hours CBC:  Recent Labs Lab 12/20/12 1657  WBC 10.3  NEUTROABS 8.9*  HGB 12.6  HCT 38.2  MCV 88.2  PLT 252   Cardiac Enzymes: No results found for this basename: CKTOTAL, CKMB, CKMBINDEX, TROPONINI,  in the last 168 hours  CBG: No results found for this basename: GLUCAP,  in the last 168 hours   Radiological Exams on Admission: US Abdomen  Complete  12/20/2012   *RADIOLOGY REPORT*  Clinical Data:  32 year old female with right upper quadrant pain nausea and vomiting.  COMPLETE ABDOMINAL ULTRASOUND  Comparison:  07/30/2012.  Findings:  Gallbladder:  Layering sludge in the gallbladder fundus.  Gravel like shadowing echogenic stones.  Individual stones might measure up to 5 mm diameter.  Gallbladder wall remains normal at 1-2 mm thickness.  No sonographic Murphy's sign elicited.  No pericholecystic fluid.  Common bile duct:  Abnormally dilated up to 14 mm, previously 6-7 mm.  Liver:  Positive intrahepatic biliary ductal dilatation.  Normal hepatic echogenicity.  No discrete liver lesion.  IVC:  Appears normal.  Pancreas:  No focal abnormality seen.  Spleen:  Normal measuring 9.2 cm in length.  Right Kidney:  Normal measuring 11.7 cm in length.  Left Kidney:  Not as well visualized.  Grossly stable and within normal limits.  Approximate  length 10.8 cm.  Abdominal aorta:  Incompletely visualized due to overlying bowel gas, visualized portions within normal limits.  IMPRESSION: 1.  Intra and extrahepatic biliary ductal dilatation.  In the setting of chronic gallbladder sludge and small stones distal CBD obstruction due to stone is favored. 2.  No evidence of acute cholecystitis. 3.  Otherwise negative abdominal ultrasound.  Study discussed by telephone with PA Antony Madura on 12/20/2012 at 1925 hours.   Original Report Authenticated By: Erskine Speed, M.D.   Dg Abd Acute W/chest  12/20/2012   *RADIOLOGY REPORT*  Clinical Data: Abdominal pain, nausea/vomiting, asthma  ACUTE ABDOMEN SERIES (ABDOMEN 2 VIEW & CHEST 1 VIEW)  Comparison: Abdominal radiograph dated 11/13/2012  Findings: Lungs are clear. No pleural effusion or pneumothorax.  Cardiomediastinal silhouette is within normal limits.  Nonobstructive bowel gas pattern.  No evidence of free air under the diaphragm on the upright view.  Surgical sutures in the right mid abdomen.  Visualized osseous structures are within normal limits.  IMPRESSION: No evidence of acute cardiopulmonary disease.  No evidence of small bowel obstruction or free air.  Surgical sutures in the right mid abdomen.   Original Report Authenticated By: Charline Bills, M.D.    Assessment/Plan Present on Admission:  . Overweight(278.02) . Cholelithiasis . Carcinoid tumor of appendix, malignant Gallstone pancreatitis  PLAN: I spoke with Dr Alvester Morin of surgical service.  He would like to have her CCY during this hospitalization and will coordinate that with his surgical team.  Please consult GI tomorrow as she would benefit getting EGD with stone removal.  In the interim, will give her pain meds, IVF, and make NPO.  I have continued her meds.  She is stable, full code, and will be admitted to [facility] service.  Thank you for allowing me to partake in the care of this nice patient.  Other plans as per orders.  Code Status:  FULL Unk Lightning, MD. [facility] Pager [phone] 7pm to 7am.  12/20/2012, 9:05 PM

## 2012-12-20 NOTE — ED Notes (Signed)
No drainage from abdominal dressing

## 2012-12-20 NOTE — ED Provider Notes (Signed)
History    CSN: [id number] Arrival date & time 12/20/12  1606  First MD Initiated Contact with Patient 12/20/12 1635     Chief Complaint  Patient presents with  . Abdominal Pain    Plus n/v--known cholelithiasis   (Consider location/radiation/quality/duration/timing/severity/associated sxs/prior Treatment) HPI Comments: Patient is a 32 year old female who presents for epigastric pain worsening since yesterday. She states the pain is sharp in nature and radiating around to her upper left and right abdomen as well as up to her chest. Patient states the pain has been constant since onset without any aggravating or alleviating factors. She admits to associated nausea and TMTC episodes of NB/NB emesis. Patient has taken Percocet for the pain without relief. She denies a history of fever, shortness of breath, urinary symptoms, diarrhea, melena or hematochezia, vaginal complaints, numbness or tingling, and extremity weakness.  Patient states she was diagnosed with cholelithiasis during her perinatal period. Patient followed up with surgery for this complaint and they advised that she refrain from surgery until after her delivery. Patient delivered at full term via C-section on 11/03/2012. One day later the patient was seen at [facility] for significant abdominal distention and sharp abdominal pain. Patient was found to have a colonic perforation at this time. She was emergently taken to the operating room where her appendix and part of her colon were resected. Pathology report was significant for carcinoid tumor of the appendix with no involvement of the surrounding lymph nodes. Patient has been recovering since this procedure for the last month with intermittent nausea and emesis. She is followed surgically by Dr. Carolynne Edouard  Patient not currently breastfeeding.  Patient is a 32 y.o. female presenting with abdominal pain. The history is provided by the patient. No language interpreter was used.    Abdominal Pain Associated symptoms include abdominal pain, chest pain, nausea and vomiting. Pertinent negatives include no fever, numbness or weakness.   Past Medical History  Diagnosis Date  . Gallstones   . Cholecystitis chronic, acute   . Heart murmur     no indications for 7-73yrs per pt.  . Asthma     childhood exercise induced only  . Headache(784.0)     migraines   Past Surgical History  Procedure Laterality Date  . No past surgeries    . Cesarean section N/A 11/03/2012    Procedure: PRIMARY CESAREAN SECTION;  Surgeon: Bing Plume, MD;  Location: [facility];  Service: Obstetrics;  Laterality: N/A;  . Laparotomy N/A 11/08/2012    Procedure: laparoscopic exploration and EXPLORATORY LAPAROTOMY;  Surgeon: Robyne Askew, MD;  Location: [facility];  Service: General;  Laterality: N/A;   Family History  Problem Relation Age of Onset  . ADD / ADHD      family hx  . Breast cancer      fhx  . Mental illness      fhx  . Heart disease      fhx  . Hypertension      fhx   History  Substance Use Topics  . Smoking status: Never Smoker   . Smokeless tobacco: Not on file  . Alcohol Use: No   OB History   Grav Para Term Preterm Abortions TAB SAB Ect Mult Living   3 3 1       3      Review of Systems  Constitutional: Negative for fever.  Eyes: Negative for visual disturbance.  Respiratory: Positive for shortness of breath (With increased pain).   Cardiovascular:  Positive for chest pain.  Gastrointestinal: Positive for nausea, vomiting and abdominal pain. Negative for diarrhea and blood in stool.  Genitourinary: Negative for hematuria, vaginal bleeding, vaginal discharge and dyspareunia.  Neurological: Negative for weakness and numbness.  All other systems reviewed and are negative.   Allergies  Review of patient's allergies indicates no known allergies.  Home Medications   Current Outpatient Rx  Name  Route  Sig  Dispense  Refill  . diphenhydrAMINE (BENADRYL) 25 mg  capsule   Oral   Take 25 mg by mouth every 6 (six) hours as needed for itching.         Marland Kitchen ibuprofen (ADVIL,MOTRIN) 600 MG tablet   Oral   Take 1 tablet (600 mg total) by mouth every 6 (six) hours as needed for pain.   30 tablet   0   . omeprazole (PRILOSEC) 40 MG capsule   Oral   Take 40 mg by mouth daily.         Marland Kitchen oxyCODONE-acetaminophen (PERCOCET/ROXICET) 5-325 MG per tablet   Oral   Take 1-2 tablets by mouth every 4 (four) hours as needed.   60 tablet   0   . promethazine (PHENERGAN) 12.5 MG tablet   Oral   Take 1 tablet (12.5 mg total) by mouth every 6 (six) hours as needed for nausea.   10 tablet   1    BP 124/75  Pulse 75  Temp(Src) 98.9 F (37.2 C) (Oral)  Resp 13  SpO2 99%  Physical Exam  Nursing note and vitals reviewed. Constitutional: She is oriented to person, place, and time. She appears well-developed and well-nourished. No distress.  HENT:  Head: Normocephalic and atraumatic.  Mouth/Throat: No oropharyngeal exudate.  Eyes: Conjunctivae and EOM are normal. Pupils are equal, round, and reactive to light. No scleral icterus.  Neck: Normal range of motion.  Cardiovascular: Normal rate, regular rhythm and normal heart sounds.   Pulmonary/Chest: Effort normal and breath sounds normal. No respiratory distress. She has no wheezes. She has no rales.  Abdominal: Soft. Normal appearance and bowel sounds are normal. She exhibits no distension, no ascites, no pulsatile midline mass and no mass. There is tenderness in the right upper quadrant and epigastric area. There is positive Murphy's sign. There is no rebound, no guarding and no CVA tenderness.    Musculoskeletal: Normal range of motion.  Neurological: She is alert and oriented to person, place, and time.  Skin: Skin is warm and dry. No rash noted. She is not diaphoretic. No erythema. No pallor.  Psychiatric: She has a normal mood and affect. Her behavior is normal.    ED Course  Procedures (including  critical care time) Labs Reviewed  CBC WITH DIFFERENTIAL - Abnormal; Notable for the following:    Neutrophils Relative % 86 (*)    Neutro Abs 8.9 (*)    Lymphocytes Relative 11 (*)    All other components within normal limits  LIPASE, BLOOD - Abnormal; Notable for the following:    Lipase >3000 (*)    All other components within normal limits  COMPREHENSIVE METABOLIC PANEL - Abnormal; Notable for the following:    Glucose, Bld 137 (*)    Creatinine, Ser 0.49 (*)    AST 420 (*)    ALT 492 (*)    Alkaline Phosphatase 512 (*)    Total Bilirubin 2.8 (*)    All other components within normal limits  URINALYSIS, ROUTINE W REFLEX MICROSCOPIC - Abnormal; Notable for the following:  Color, Urine ORANGE (*)    APPearance CLOUDY (*)    Bilirubin Urine LARGE (*)    Ketones, ur 40 (*)    Leukocytes, UA SMALL (*)    All other components within normal limits  AMYLASE - Abnormal; Notable for the following:    Amylase 1276 (*)    All other components within normal limits  URINE MICROSCOPIC-ADD ON - Abnormal; Notable for the following:    Bacteria, UA FEW (*)    All other components within normal limits   US Abdomen Complete  12/20/2012   *RADIOLOGY REPORT*  Clinical Data:  32 year old female with right upper quadrant pain nausea and vomiting.  COMPLETE ABDOMINAL ULTRASOUND  Comparison:  07/30/2012.  Findings:  Gallbladder:  Layering sludge in the gallbladder fundus.  Gravel like shadowing echogenic stones.  Individual stones might measure up to 5 mm diameter.  Gallbladder wall remains normal at 1-2 mm thickness.  No sonographic Murphy's sign elicited.  No pericholecystic fluid.  Common bile duct:  Abnormally dilated up to 14 mm, previously 6-7 mm.  Liver:  Positive intrahepatic biliary ductal dilatation.  Normal hepatic echogenicity.  No discrete liver lesion.  IVC:  Appears normal.  Pancreas:  No focal abnormality seen.  Spleen:  Normal measuring 9.2 cm in length.  Right Kidney:  Normal measuring  11.7 cm in length.  Left Kidney:  Not as well visualized.  Grossly stable and within normal limits.  Approximate length 10.8 cm.  Abdominal aorta:  Incompletely visualized due to overlying bowel gas, visualized portions within normal limits.  IMPRESSION: 1.  Intra and extrahepatic biliary ductal dilatation.  In the setting of chronic gallbladder sludge and small stones distal CBD obstruction due to stone is favored. 2.  No evidence of acute cholecystitis. 3.  Otherwise negative abdominal ultrasound.  Study discussed by telephone with PA Antony Madura on 12/20/2012 at 1925 hours.   Original Report Authenticated By: Erskine Speed, M.D.   Dg Abd Acute W/chest  12/20/2012   *RADIOLOGY REPORT*  Clinical Data: Abdominal pain, nausea/vomiting, asthma  ACUTE ABDOMEN SERIES (ABDOMEN 2 VIEW & CHEST 1 VIEW)  Comparison: Abdominal radiograph dated 11/13/2012  Findings: Lungs are clear. No pleural effusion or pneumothorax.  Cardiomediastinal silhouette is within normal limits.  Nonobstructive bowel gas pattern.  No evidence of free air under the diaphragm on the upright view.  Surgical sutures in the right mid abdomen.  Visualized osseous structures are within normal limits.  IMPRESSION: No evidence of acute cardiopulmonary disease.  No evidence of small bowel obstruction or free air.  Surgical sutures in the right mid abdomen.   Original Report Authenticated By: Charline Bills, M.D.    Date: 12/20/2012  Rate: 87  Rhythm: normal sinus rhythm  QRS Axis: normal  Intervals: normal  ST/T Wave abnormalities: normal  Conduction Disutrbances:none  Narrative Interpretation: NSR; no STEMI  Old EKG Reviewed: none available I have personally reviewed and interpreted this EKG  1. Gallstone pancreatitis    MDM  32 year old female with a history of carcinoid tumor of the appendix and colonic perforation one month ago presents for epigastric and right upper quadrant pain worsening since last night. Patient extremely tender in  the right upper quadrant on palpation. No peritoneal signs or evidence of acute surgical abdomen. Labs without leukocytosis; however, patient's liver function tests significantly elevated from one month ago. AST 420 and ALT 492. Patient also elevated alkaline phosphatase 07/09/2010 and increased total bilirubin to 2.8. X-ray of abdomen and chest without evidence of  free air or bowel obstruction. No evidence of acute cardiopulmonary process. Abdominal ultrasound ordered for further evaluation of symptoms. Lipase in process.  Lipase greater than 3000. Ultrasound pending.  Ultrasound significant for intra and extrahepatic biliary duct dilatation. Common bile duct measuring 14 mm. Radiologist reading favoring distal common bile duct obstruction secondary to gallstone. No evidence of cholecystitis on ultrasound; no sonographic Murphy's sign or pericholecystic fluid. Will consult general surgery and internal medicine for admission.  Dr. Conley Rolls to admit. Dr. Ezzard Standing has been made aware of the patient; states he anticipates surgery after pancreatitis improves.       Antony Madura, PA-C 12/20/12 2004

## 2012-12-20 NOTE — ED Provider Notes (Signed)
Medical screening examination/treatment/procedure(s) were conducted as a shared visit with non-physician practitioner(s) and myself.  I personally evaluated the patient during the encounter  32 yo female with severe RUQ and epigastric abdominal pain starting today.  Tender to palpation in RUQ and epigastrium without peritoneal signs.  Lipase highly elevated.  Admitted.    Candyce Churn, MD 12/21/12 [phone]

## 2012-12-20 NOTE — ED Notes (Signed)
She tells me she underwent C-section this June; and at perinatal period, was found to have cholelithiasis; for which she has seen Dr. Carolynne Edouard.  She is here with c/o upper abd. Pain plus several episodes of n/v since yesterday evening.  She is writhing, as if in much pain.  She is here with her mother.

## 2012-12-20 NOTE — ED Notes (Signed)
Attempted to call report, RN to return call 

## 2012-12-20 NOTE — Consult Note (Signed)
Re:   [name] [name] DOB:   01/27/1981 MRN:   [id number]  General Surgery Consult  ASSESSMENT AND PLAN: 1.  Pancreatitis  Probably secondary to gall stones  Discussed with Dr. Nedra Hai.    Plan:  Bowel rest, IVF, follow labs  2.  Cholelithiasis  Dilated biliary system with probable common bile duct stones  Will need GI consult and evaluation 3.  Right colectomy for serosal tear - 11/08/2012 - P. Toth 4.  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  C-Section - 11/03/2012  Daughter named Ava 6.  History of asthma, no current issues 7.  Open wound just about healed   Chief Complaint  Patient presents with  . Abdominal Pain    Plus n/v--known cholelithiasis   REFERRING PHYSICIAN:  Dr. Loretha Stapler, [facility]  HISTORY OF PRESENT ILLNESS: [name] [name] is a 32 y.o. (DOB: 1980-09-22)  white  female whose primary care physician is TODD,JEFFREY ALLEN, MD and comes to [facility] ER today for abdominal pain.  Her mother is at the bedside.  She has had symptomatic gallstones for some time.  She was seen by Dr. Clovis Pu on 09/24/2012 for symptomatic gallstones.  Planned surgery was delayed until after her delivery.  She had a C section 11/03/2012 by Dr. Ronnell Freshwater.  She has a healthy girl named Ava.  Post C section, she developed an ileus and had a serosal tear of her right colon.  This required a right colectomy by Dr. Carolynne Edouard on 11/08/2012.  Her post op course was complicated by an ileus and open wound.   She was discharged home around 11/18/2012.  Her wound has done well.  But she has been "sick" 5 times since discharge.  Today, she got sick enough and vomited that her mother brought her to the [facility].  He labs point to pancreatitis.  An US shows biliary dilatation.    Amylase - 1276, Lipase > 3000 - 12/20/2012 LFT's - T. Bili - 2.8, Alk Phos - 512, ALT - 492 - 12/20/2012 [place] - intra and extrahepatic duct dilatation, no evidence of cholecystitis. -  12/20/2012    Past Medical History  Diagnosis Date  . Gallstones   . Cholecystitis chronic, acute   . Heart murmur     no indications for 7-24yrs per pt.  . Asthma     childhood exercise induced only  . Headache(784.0)     migraines      Past Surgical History  Procedure Laterality Date  . No past surgeries    . Cesarean section N/A 11/03/2012    Procedure: PRIMARY CESAREAN SECTION;  Surgeon: Bing Plume, MD;  Location: [facility] ORS;  Service: Obstetrics;  Laterality: N/A;  . Laparotomy N/A 11/08/2012    Procedure: laparoscopic exploration and EXPLORATORY LAPAROTOMY;  Surgeon: Robyne Askew, MD;  Location: [facility] ORS;  Service: General;  Laterality: N/A;      Current Facility-Administered Medications  Medication Dose Route Frequency Provider Last Rate Last Dose  . sodium chloride 0.9 % bolus 1,000 mL  1,000 mL Intravenous Once Antony Madura, PA-C       Current Outpatient Prescriptions  Medication Sig Dispense Refill  . diphenhydrAMINE (BENADRYL) 25 mg capsule Take 25 mg by mouth every 6 (six) hours as needed for itching.      Marland Kitchen ibuprofen (ADVIL,MOTRIN) 600 MG tablet Take 1 tablet (600 mg total) by mouth every 6 (six) hours as needed for pain.  30 tablet  0  . omeprazole (PRILOSEC) 40 MG capsule Take 40 mg by mouth daily.      Marland Kitchen oxyCODONE-acetaminophen (PERCOCET/ROXICET) 5-325 MG per tablet Take 1-2 tablets by mouth every 4 (four) hours as needed.  60 tablet  0  . promethazine (PHENERGAN) 12.5 MG tablet Take 1 tablet (12.5 mg total) by mouth every 6 (six) hours as needed for nausea.  10 tablet  1     No Known Allergies  REVIEW OF SYSTEMS: Skin:  No history of rash.  No history of abnormal moles. Infection:  No history of hepatitis or HIV.  No history of MRSA. Neurologic:  No history of stroke.  No history of seizure.  No history of headaches. Cardiac:  No history of hypertension. No history of heart disease.  No history of prior cardiac catheterization.  No history of seeing a  cardiologist. Pulmonary:  Does not smoke cigarettes.  No asthma or bronchitis.  No OSA/CPAP.  Endocrine:  No diabetes. No thyroid disease. Gastrointestinal:  See HPI.   Urologic:  No history of kidney stones.  No history of bladder infections. GYN:  C section for term infant - 11/03/2012.  She has 2 other children, 10 and 11 Musculoskeletal:  No history of joint or back disease. Hematologic:  No bleeding disorder.  No history of anemia.  Not anticoagulated. Psycho-social:  The patient is oriented.   The patient has no obvious psychologic or social impairment to understanding our conversation and plan.  SOCIAL and FAMILY HISTORY: Unmarried. Boyfriend, Nena Alexander, and father of last child lives with her. She worked at [employer] until Jan 2014.  She has been out of work since then. She has 3 children - 11, 83, and 73 weeks old. Her mother is at the bedside.  PHYSICAL EXAM: BP 124/75  Pulse 75  Temp(Src) 98.9 F (37.2 C) (Oral)  Resp 13  SpO2 99%  General: WN WF who is alert and generally healthy appearing.  HEENT: Normal. Pupils equal. Neck: Supple. No mass.  No thyroid mass. Lymph Nodes:  No supraclavicular or cervical nodes. Lungs: Clear to auscultation and symmetric breath sounds. Heart:  RRR. No murmur or rub. Abdomen: Soft. No mass. No tenderness.  Decreased bowel sounds.  Almost healed midline wound. Rectal: Not done. Extremities:  Good strength and ROM  in upper and lower extremities. Neurologic:  Grossly intact to motor and sensory function. Psychiatric: Has normal mood and affect. Behavior is normal.   DATA REVIEWED: Labs and Epic notes.  Ovidio Kin, MD,  [facility] [address] [place]  [address]   [place], [place]    [address] Phone:  [phone] FAX:  [phone]

## 2012-12-21 DIAGNOSIS — E663 Overweight: Secondary | ICD-10-CM

## 2012-12-21 DIAGNOSIS — G43909 Migraine, unspecified, not intractable, without status migrainosus: Secondary | ICD-10-CM

## 2012-12-21 MED ORDER — CIPROFLOXACIN IN D5W 400 MG/200ML IV SOLN
400.0000 mg | INTRAVENOUS | Status: AC
  Administered 2012-12-22: 400 mg via INTRAVENOUS
  Filled 2012-12-21: qty 200

## 2012-12-21 MED ORDER — ACETAMINOPHEN 10 MG/ML IV SOLN
1000.0000 mg | Freq: Four times a day (QID) | INTRAVENOUS | Status: AC
  Administered 2012-12-21 – 2012-12-22 (×3): 1000 mg via INTRAVENOUS
  Filled 2012-12-21 (×4): qty 100

## 2012-12-21 MED ORDER — SODIUM CHLORIDE 0.9 % IV SOLN
INTRAVENOUS | Status: DC
  Administered 2012-12-22: 07:00:00 via INTRAVENOUS

## 2012-12-21 MED ORDER — PANTOPRAZOLE SODIUM 40 MG IV SOLR
40.0000 mg | INTRAVENOUS | Status: DC
  Administered 2012-12-21 – 2012-12-23 (×3): 40 mg via INTRAVENOUS
  Filled 2012-12-21 (×4): qty 40

## 2012-12-21 NOTE — Progress Notes (Signed)
[facility] PROGRESS NOTE  [name] [name] WUJ:[id number] DOB: 1980/07/18 DOA: 12/20/2012 PCP: Evette Georges, MD  Assessment/Plan: 1-Acute gallstone pancreatitis: -continue supportive care -IVF's, PRN analgesics and antiemetics -continue NPO for bowel rest -Patient with dilated CBD; plan is for ERCP on 7/21 -after ERCP once pancreatitis cool off plan is for cholecystectomy  2-Cholelithiasis: once pancreatitis is cool off and ERCP completed, surgery planning for cholecystectomy  3-HA: secondary to use of dilaudid. Will treat with tylenol  4-GERD: continue PPI  DVT: heparin.  Code Status: Full Family Communication: mother at bedside Disposition Plan: home when medically stable   Consultants:  GI  Surgery  Procedures:  ERCP plan for tomorrow 7/21 (Dr. Leone Payor)  Antibiotics:  Cipro would be given pre-op for ERCP  HPI/Subjective: Feeling slightly better; no nausea or vomiting and with improvement in her abdominal pain.  Objective: Filed Vitals:   12/20/12 2147 12/20/12 2200 12/21/12 0556 12/21/12 1416  BP:  129/84 115/76 100/69  Pulse:  71 67 64  Temp:  98.5 F (36.9 C) 98 F (36.7 C) 97.8 F (36.6 C)  TempSrc:   Oral Oral  Resp:  18 18 18   Height: 5\' 5"  (1.651 m)     Weight: 108.863 kg (240 lb)     SpO2:  98% 98% 97%    Intake/Output Summary (Last 24 hours) at 12/21/12 1428 Last data filed at 12/21/12 1000  Gross per 24 hour  Intake 728.33 ml  Output    450 ml  Net 278.33 ml   Filed Weights   12/20/12 2147  Weight: 108.863 kg (240 lb)    Exam:   General:  Slightly better; and with improvement in her abd pain; no nausea or vomiting  Cardiovascular: S1 and S2, no rubs or gallops  Respiratory: CTA bilaterally  Abdomen: soft, ND; mild epigastric/midabdomen discomfort with deep palpation; decrease BS  Musculoskeletal: no edema or cyanosis  Data Reviewed: Basic Metabolic Panel:  Recent Labs Lab 12/20/12 1657  NA 138  K 4.4   CL 100  CO2 24  GLUCOSE 137*  BUN 8  CREATININE 0.49*  CALCIUM 10.0   Liver Function Tests:  Recent Labs Lab 12/20/12 1657  AST 420*  ALT 492*  ALKPHOS 512*  BILITOT 2.8*  PROT 8.1  ALBUMIN 4.0    Recent Labs Lab 12/20/12 1657 12/20/12 1910  LIPASE >3000*  --   AMYLASE  --  1276*   CBC:  Recent Labs Lab 12/20/12 1657  WBC 10.3  NEUTROABS 8.9*  HGB 12.6  HCT 38.2  MCV 88.2  PLT 252    Studies: US Abdomen Complete  12/20/2012   *RADIOLOGY REPORT*  Clinical Data:  32 year old female with right upper quadrant pain nausea and vomiting.  COMPLETE ABDOMINAL ULTRASOUND  Comparison:  07/30/2012.  Findings:  Gallbladder:  Layering sludge in the gallbladder fundus.  Gravel like shadowing echogenic stones.  Individual stones might measure up to 5 mm diameter.  Gallbladder wall remains normal at 1-2 mm thickness.  No sonographic Murphy's sign elicited.  No pericholecystic fluid.  Common bile duct:  Abnormally dilated up to 14 mm, previously 6-7 mm.  Liver:  Positive intrahepatic biliary ductal dilatation.  Normal hepatic echogenicity.  No discrete liver lesion.  IVC:  Appears normal.  Pancreas:  No focal abnormality seen.  Spleen:  Normal measuring 9.2 cm in length.  Right Kidney:  Normal measuring 11.7 cm in length.  Left Kidney:  Not as well visualized.  Grossly stable and within normal limits.  Approximate length  10.8 cm.  Abdominal aorta:  Incompletely visualized due to overlying bowel gas, visualized portions within normal limits.  IMPRESSION: 1.  Intra and extrahepatic biliary ductal dilatation.  In the setting of chronic gallbladder sludge and small stones distal CBD obstruction due to stone is favored. 2.  No evidence of acute cholecystitis. 3.  Otherwise negative abdominal ultrasound.  Study discussed by telephone with PA Antony Madura on 12/20/2012 at 1925 hours.   Original Report Authenticated By: Erskine Speed, M.D.   Dg Abd Acute W/chest  12/20/2012   *RADIOLOGY REPORT*   Clinical Data: Abdominal pain, nausea/vomiting, asthma  ACUTE ABDOMEN SERIES (ABDOMEN 2 VIEW & CHEST 1 VIEW)  Comparison: Abdominal radiograph dated 11/13/2012  Findings: Lungs are clear. No pleural effusion or pneumothorax.  Cardiomediastinal silhouette is within normal limits.  Nonobstructive bowel gas pattern.  No evidence of free air under the diaphragm on the upright view.  Surgical sutures in the right mid abdomen.  Visualized osseous structures are within normal limits.  IMPRESSION: No evidence of acute cardiopulmonary disease.  No evidence of small bowel obstruction or free air.  Surgical sutures in the right mid abdomen.   Original Report Authenticated By: Charline Bills, M.D.    Scheduled Meds: . acetaminophen  1,000 mg Intravenous Q6H  . ciprofloxacin  400 mg Intravenous 60 min Pre-Op  . heparin  5,000 Units Subcutaneous Q8H  . pantoprazole (PROTONIX) IV  40 mg Intravenous Q24H   Continuous Infusions: . dextrose 5 % and 0.9% NaCl 100 mL/hr at 12/21/12 1610    Principal Problem:   Gallstone pancreatitis Active Problems:   Overweight(278.02)   Cholelithiasis   Carcinoid tumor of appendix, malignant    Time spent: >30 minutes    [name] [name]  [facility] Pager [phone]. If 7PM-7AM, please contact night-coverage at [email or web address], password [facility] 12/21/2012, 2:28 PM  LOS: 1 day

## 2012-12-21 NOTE — Progress Notes (Signed)
Gallstone pancreatitis  Subjective: Feels about the same this am.  Pain and nausea controlled   Relevant history: [name] [name] is a 32 y.o. (DOB: March 28, 1981) white female whose primary care physician is TODD,JEFFREY ALLEN, MD and comes to [facility] ER Sat for abdominal pain.  She has had symptomatic gallstones for some time. She was seen by Dr. Clovis Pu on 09/24/2012 for symptomatic gallstones. Planned surgery was delayed until after her delivery.  She had a C section 11/03/2012 by Dr. Ronnell Freshwater. She has a healthy girl named Ava. Post C section, she developed an ileus and had a serosal tear of her right colon. This required a right colectomy by Dr. Carolynne Edouard on 11/08/2012. Her post op course was complicated by an ileus and open wound.  She was discharged home around 11/18/2012. Her wound has done well. But she has been "sick" 5 times since discharge. Today, she got sick enough and vomited that her mother brought her to the [facility]. He labs point to gallstone pancreatitis. An US shows biliary dilatation.   Objective: Vital signs in last 24 hours: Temp:  [98 F (36.7 C)-98.9 F (37.2 C)] 98 F (36.7 C) (07/20 0556) Pulse Rate:  [67-91] 67 (07/20 0556) Resp:  [13-20] 18 (07/20 0556) BP: (115-150)/(72-87) 115/76 mmHg (07/20 0556) SpO2:  [92 %-100 %] 98 % (07/20 0556) Weight:  [240 lb (108.863 kg)] 240 lb (108.863 kg) (07/19 2147) Last BM Date: 12/19/12  Intake/Output from previous day: 07/19 0701 - 07/20 0700 In: 728.3 [I.V.:728.3] Out: -  Intake/Output this shift:    General appearance: alert and cooperative Resp: clear to auscultation bilaterally GI: normal findings: soft, non-tender  Lab Results:  Results for orders placed during the hospital encounter of 12/20/12 (from the past 24 hour(s))  CBC WITH DIFFERENTIAL     Status: Abnormal   Collection Time    12/20/12  4:57 PM      Result Value Range   WBC 10.3  4.0 - 10.5 K/uL   RBC 4.33  3.87 - 5.11 MIL/uL   Hemoglobin 12.6  12.0 - 15.0 g/dL   HCT 19.1  47.8 - 29.5 %   MCV 88.2  78.0 - 100.0 fL   MCH 29.1  26.0 - 34.0 pg   MCHC 33.0  30.0 - 36.0 g/dL   RDW 62.1  30.8 - 65.7 %   Platelets 252  150 - 400 K/uL   Neutrophils Relative % 86 (*) 43 - 77 %   Neutro Abs 8.9 (*) 1.7 - 7.7 K/uL   Lymphocytes Relative 11 (*) 12 - 46 %   Lymphs Abs 1.1  0.7 - 4.0 K/uL   Monocytes Relative 3  3 - 12 %   Monocytes Absolute 0.3  0.1 - 1.0 K/uL   Eosinophils Relative 0  0 - 5 %   Eosinophils Absolute 0.0  0.0 - 0.7 K/uL   Basophils Relative 0  0 - 1 %   Basophils Absolute 0.0  0.0 - 0.1 K/uL  LIPASE, BLOOD     Status: Abnormal   Collection Time    12/20/12  4:57 PM      Result Value Range   Lipase >3000 (*) 11 - 59 U/L  COMPREHENSIVE METABOLIC PANEL     Status: Abnormal   Collection Time    12/20/12  4:57 PM      Result Value Range   Sodium 138  135 - 145 mEq/L   Potassium 4.4  3.5 - 5.1 mEq/L   Chloride  100  96 - 112 mEq/L   CO2 24  19 - 32 mEq/L   Glucose, Bld 137 (*) 70 - 99 mg/dL   BUN 8  6 - 23 mg/dL   Creatinine, Ser 1.32 (*) 0.50 - 1.10 mg/dL   Calcium 44.0  8.4 - 10.2 mg/dL   Total Protein 8.1  6.0 - 8.3 g/dL   Albumin 4.0  3.5 - 5.2 g/dL   AST 725 (*) 0 - 37 U/L   ALT 492 (*) 0 - 35 U/L   Alkaline Phosphatase 512 (*) 39 - 117 U/L   Total Bilirubin 2.8 (*) 0.3 - 1.2 mg/dL   GFR calc non Af Amer >90  >90 mL/min   GFR calc Af Amer >90  >90 mL/min  URINALYSIS, ROUTINE W REFLEX MICROSCOPIC     Status: Abnormal   Collection Time    12/20/12  7:05 PM      Result Value Range   Color, Urine ORANGE (*) YELLOW   APPearance CLOUDY (*) CLEAR   Specific Gravity, Urine 1.024  1.005 - 1.030   pH 5.0  5.0 - 8.0   Glucose, UA NEGATIVE  NEGATIVE mg/dL   Hgb urine dipstick NEGATIVE  NEGATIVE   Bilirubin Urine LARGE (*) NEGATIVE   Ketones, ur 40 (*) NEGATIVE mg/dL   Protein, ur NEGATIVE  NEGATIVE mg/dL   Urobilinogen, UA 1.0  0.0 - 1.0 mg/dL   Nitrite NEGATIVE  NEGATIVE   Leukocytes, UA SMALL (*) NEGATIVE  URINE MICROSCOPIC-ADD  ON     Status: Abnormal   Collection Time    12/20/12  7:05 PM      Result Value Range   Squamous Epithelial / LPF RARE  RARE   WBC, UA 0-2  <3 WBC/hpf   Bacteria, UA FEW (*) RARE   Urine-Other MUCOUS PRESENT    AMYLASE     Status: Abnormal   Collection Time    12/20/12  7:10 PM      Result Value Range   Amylase 1276 (*) 0 - 105 U/L     Studies/Results Radiology   MEDS, Scheduled . heparin  5,000 Units Subcutaneous Q8H  . pantoprazole  80 mg Oral Daily     Assessment: Gallstone pancreatitis  Plan: Gi to evaluate, most likely will need ERCP Will need cholecystectomy once pancreatitis clears   LOS: 1 day    Vanita Panda, MD [facility], [place] [phone]   12/21/2012 8:08 AM

## 2012-12-21 NOTE — Consult Note (Signed)
[facility] Consultation  Referring Provider:   [facility]   Primary Care Physician:  Evette Georges, MD Primary Gastroenterologist:   none      Reason for Consultation:  pancreatitis         HPI:   Stephanine Reas is a 32 y.o. female with known symptomatic cholelithiasiswho has been unable to have her gallbladder removed because of pregnancy. She had a c-section early June. A few days later patient was hospitalized for a perforated cecum, she underwent exploratory laparotomy with right colectomy. Postop course was complicated by ileus requiring nasogastric decompression and a PICC line with PNA. 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 Since C-section she has lost 40 pounds   Patient was admitted last night with abdominal pain, nausea and vomiting. She was seen at ED twice during pregnancy with "gallbladder attacks". Now ultrasound reveals progressive CBD dilation as well as dilated intrahepatic ducts and gallstones. Lipase greater than 3000. Transaminases are in the 4 hundreds. Alkaline phosphatase 512 and total bilirubin 2.8. She is afebrile. Her white count is normal.She feels much better with pain meds.    Past Medical History  Diagnosis Date  . Gallstones   . Cholecystitis chronic, acute   . Heart murmur     no indications for 7-51yrs per pt.  . Asthma     childhood exercise induced only  . Headache(784.0)     migraines    Past Surgical History  Procedure Laterality Date  . Cesarean section N/A 11/03/2012    Procedure: PRIMARY CESAREAN SECTION;  Surgeon: Bing Plume, MD;  Location: [facility];  Service: Obstetrics;  Laterality: N/A;  . Laparotomy N/A 11/08/2012    Procedure: laparoscopic exploration and EXPLORATORY LAPAROTOMY;  Surgeon: Robyne Askew, MD;  Location: [facility];  Service: General;  Laterality: N/A;  . Appendectomy      Family History  Problem Relation Age of  Onset  . ADD / ADHD      family hx  . Breast cancer      fhx  . Mental illness      fhx  . Heart disease      fhx  . Hypertension      fhx     History  Substance Use Topics  . Smoking status: Never Smoker   . Smokeless tobacco: Not on file  . Alcohol Use: No    Prior to Admission medications   Medication Sig Start Date End Date Taking? Authorizing Provider  diphenhydrAMINE (BENADRYL) 25 mg capsule Take 25 mg by mouth every 6 (six) hours as needed for itching.   Yes Historical Provider, MD  ibuprofen (ADVIL,MOTRIN) 600 MG tablet Take 1 tablet (600 mg total) by mouth every 6 (six) hours as needed for pain. 11/06/12  Yes Bing Plume, MD  omeprazole (PRILOSEC) 40 MG capsule Take 40 mg by mouth daily.   Yes Historical Provider, MD  oxyCODONE-acetaminophen (PERCOCET/ROXICET) 5-325 MG per tablet Take 1-2 tablets by mouth every 4 (four) hours as needed. 11/19/12  Yes Doristine Mango, PA-C  promethazine (PHENERGAN) 12.5 MG tablet Take 1 tablet (12.5 mg total) by mouth every 6 (six) hours as needed for nausea. 11/21/12  Yes Robyne Askew, MD    Current Facility-Administered Medications  Medication Dose Route Frequency Provider Last Rate Last Dose  . dextrose 5 %-0.9 % sodium chloride infusion   Intravenous Continuous [place] Siren, MD 100 mL/hr at 12/21/12 0851    .  heparin injection 5,000 Units  5,000 Units Subcutaneous Q8H [place] Siren, MD   5,000 Units at 12/21/12 0520  . HYDROmorphone (DILAUDID) injection 1 mg  1 mg Intravenous Q2H PRN [place] Siren, MD   1 mg at 12/21/12 0018  . ondansetron (ZOFRAN) tablet 4 mg  4 mg Oral Q6H PRN [place] Siren, MD       Or  . ondansetron [facility]) injection 4 mg  4 mg Intravenous Q6H PRN [place] Siren, MD   4 mg at 12/21/12 0018  . pantoprazole (PROTONIX) EC tablet 80 mg  80 mg Oral Daily [place] Siren, MD        Allergies as of 12/20/2012  . (No Known Allergies)   Review of Systems:    All systems reviewed and negative except where noted in HPI.   Physical Exam:   Vital signs in last 24 hours: Temp:  [98 F (36.7 C)-98.9 F (37.2 C)] 98 F (36.7 C) (07/20 0556) Pulse Rate:  [67-91] 67 (07/20 0556) Resp:  [13-20] 18 (07/20 0556) BP: (115-150)/(72-87) 115/76 mmHg (07/20 0556) SpO2:  [92 %-100 %] 98 % (07/20 0556) Weight:  [240 lb (108.863 kg)] 240 lb (108.863 kg) (07/19 2147) Last BM Date: 12/18/12 General:   Pleasant obese white female in NAD Head:  Normocephalic and atraumatic. Eyes:   No icterus.   Conjunctiva pink. Ears:  Normal auditory acuity. Neck:  Supple; no masses felt Lungs:  Respirations even and unlabored. Lungs clear to auscultation bilaterally.   No wheezes, crackles, or rhonchi.  Heart:  Regular rate and rhythm Abdomen:  Soft, iobese, mild epigastric tenderness.  Normal bowel sounds. No appreciable masses or hepatomegaly.  Rectal:  Not performed.  Msk:  Symmetrical without gross deformities.  Extremities:  Without edema. Neurologic:  Alert and  oriented x4;  grossly normal neurologically. Skin:  Intact without significant lesions or rashes. Cervical Nodes:  No significant cervical adenopathy. Psych:  Alert and cooperative. Normal affect.  LAB RESULTS:  Recent Labs  12/20/12 1657  WBC 10.3  HGB 12.6  HCT 38.2  PLT 252   BMET  Recent Labs  12/20/12 1657  NA 138  K 4.4  CL 100  CO2 24  GLUCOSE 137*  BUN 8  CREATININE 0.49*  CALCIUM 10.0   LFT  Recent Labs  12/20/12 1657  PROT 8.1  ALBUMIN 4.0  AST 420*  ALT 492*  ALKPHOS 512*  BILITOT 2.8*   PT/INR No results found for this basename: LABPROT, INR,  in the last 72 hours  STUDIES: US Abdomen Complete  12/20/2012   *RADIOLOGY REPORT*  Clinical Data:  32 year old female with right upper quadrant pain nausea and vomiting.  COMPLETE ABDOMINAL ULTRASOUND  Comparison:  07/30/2012.  Findings:  Gallbladder:  Layering sludge in the gallbladder fundus.  Gravel like shadowing echogenic stones.  Individual stones might measure up to 5 mm diameter.  Gallbladder  wall remains normal at 1-2 mm thickness.  No sonographic Murphy's sign elicited.  No pericholecystic fluid.  Common bile duct:  Abnormally dilated up to 14 mm, previously 6-7 mm.  Liver:  Positive intrahepatic biliary ductal dilatation.  Normal hepatic echogenicity.  No discrete liver lesion.  IVC:  Appears normal.  Pancreas:  No focal abnormality seen.  Spleen:  Normal measuring 9.2 cm in length.  Right Kidney:  Normal measuring 11.7 cm in length.  Left Kidney:  Not as well visualized.  Grossly stable and within normal limits.  Approximate length 10.8 cm.  Abdominal aorta:  Incompletely visualized  due to overlying bowel gas, visualized portions within normal limits.  IMPRESSION: 1.  Intra and extrahepatic biliary ductal dilatation.  In the setting of chronic gallbladder sludge and small stones distal CBD obstruction due to stone is favored. 2.  No evidence of acute cholecystitis. 3.  Otherwise negative abdominal ultrasound.  Study discussed by telephone with PA Antony Madura on 12/20/2012 at 1925 hours.   Original Report Authenticated By: Erskine Speed, M.D.   Dg Abd Acute W/chest  12/20/2012   *RADIOLOGY REPORT*  Clinical Data: Abdominal pain, nausea/vomiting, asthma  ACUTE ABDOMEN SERIES (ABDOMEN 2 VIEW & CHEST 1 VIEW)  Comparison: Abdominal radiograph dated 11/13/2012  Findings: Lungs are clear. No pleural effusion or pneumothorax.  Cardiomediastinal silhouette is within normal limits.  Nonobstructive bowel gas pattern.  No evidence of free air under the diaphragm on the upright view.  Surgical sutures in the right mid abdomen.  Visualized osseous structures are within normal limits.  IMPRESSION: No evidence of acute cardiopulmonary disease.  No evidence of small bowel obstruction or free air.  Surgical sutures in the right mid abdomen.   Original Report Authenticated By: Charline Bills, M.D.   PREVIOUS ENDOSCOPIES:            none   Impression / Plan:    27. 32 year old female with biliary pancreatitis.  She has been unable to have cholecystectomy secondary to pregnancy followed by colon perforation. Ultrasound reveals progressive CBD dilation and cholelithiasis. LFTs elevated with both transaminitis and cholestasis. Continue supportive care. Patient most likely needs ERCP with stone extraction followed by cholecystectomy.   2. Cecal perforation following C-section early June, s/p exploratory laparotomy with right colectomy 11/08/12. 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 X-rays and lab were reviewed.   Pt has gallstone pancreatitis.  Pain and nausea have subsided suggesting that she passed a stone.  I suspect she has retained CBD as evidenced by a dilated duct seen on ultrasound.  Tentatively plan on ERCP on 7/21 with Dr. Leone Payor provided that pancreatitis continues to clinically subside.  The risks, benefits, and possible complications of the procedure, including bleeding, perforation, surgery, and the 5-10% risk for pancreatitis, were explained to the patient.  Patient's questions were answered.   Barbette Hair. Arlyce Dice, M.D., [facility] Cell [phone]

## 2012-12-22 ENCOUNTER — Telehealth: Payer: Self-pay | Admitting: Oncology

## 2012-12-22 ENCOUNTER — Encounter (HOSPITAL_COMMUNITY): Payer: Self-pay | Admitting: Anesthesiology

## 2012-12-22 ENCOUNTER — Encounter (HOSPITAL_COMMUNITY)
Admission: EM | Disposition: A | Discharge status: Home / Self Care | Payer: Self-pay | Source: Home / Self Care | Attending: Internal Medicine

## 2012-12-22 ENCOUNTER — Ambulatory Visit: Payer: 59 | Admitting: Oncology

## 2012-12-22 ENCOUNTER — Inpatient Hospital Stay (HOSPITAL_COMMUNITY): Payer: Medicaid Other | Admitting: Anesthesiology

## 2012-12-22 ENCOUNTER — Inpatient Hospital Stay (HOSPITAL_COMMUNITY): Payer: Medicaid Other

## 2012-12-22 ENCOUNTER — Ambulatory Visit: Payer: 59

## 2012-12-22 DIAGNOSIS — E876 Hypokalemia: Secondary | ICD-10-CM

## 2012-12-22 HISTORY — PX: ERCP: SHX5425

## 2012-12-22 LAB — COMPREHENSIVE METABOLIC PANEL
ALT: 223 U/L — ABNORMAL HIGH (ref 0–35)
Calcium: 8.7 mg/dL (ref 8.4–10.5)
GFR calc Af Amer: >90 mL/min (ref >90)
Glucose, Bld: 92 mg/dL (ref 70–99)
Sodium: 139 mEq/L (ref 135–145)
Total Protein: 5.9 g/dL — ABNORMAL LOW (ref 6.0–8.3)

## 2012-12-22 SURGERY — ERCP, WITH INTERVENTION IF INDICATED
Anesthesia: Monitor Anesthesia Care

## 2012-12-22 MED ORDER — ADULT MULTIVITAMIN W/MINERALS CH
1.0000 | ORAL_TABLET | Freq: Every day | ORAL | Status: DC
  Filled 2012-12-22 (×2): qty 1

## 2012-12-22 MED ORDER — FENTANYL CITRATE 0.05 MG/ML IJ SOLN
INTRAMUSCULAR | Status: DC | PRN
  Administered 2012-12-22: 100 ug via INTRAVENOUS
  Administered 2012-12-22: 50 ug via INTRAVENOUS
  Administered 2012-12-22: 100 ug via INTRAVENOUS

## 2012-12-22 MED ORDER — ROCURONIUM BROMIDE 100 MG/10ML IV SOLN
INTRAVENOUS | Status: DC | PRN
  Administered 2012-12-22: 2 mg via INTRAVENOUS

## 2012-12-22 MED ORDER — ONDANSETRON HCL 4 MG/2ML IJ SOLN
INTRAMUSCULAR | Status: DC | PRN
  Administered 2012-12-22: 4 mg via INTRAVENOUS

## 2012-12-22 MED ORDER — PROPOFOL 10 MG/ML IV BOLUS
INTRAVENOUS | Status: DC | PRN
  Administered 2012-12-22: 200 mg via INTRAVENOUS
  Administered 2012-12-22: 60 mg via INTRAVENOUS

## 2012-12-22 MED ORDER — SUCCINYLCHOLINE CHLORIDE 20 MG/ML IJ SOLN
INTRAMUSCULAR | Status: DC | PRN
  Administered 2012-12-22: 100 mg via INTRAVENOUS

## 2012-12-22 MED ORDER — LACTATED RINGERS IV SOLN
INTRAVENOUS | Status: DC | PRN
  Administered 2012-12-22: 10:00:00 via INTRAVENOUS

## 2012-12-22 MED ORDER — IOHEXOL 300 MG/ML  SOLN
INTRAMUSCULAR | Status: DC | PRN
  Administered 2012-12-22: 60 mL via INTRAVENOUS

## 2012-12-22 MED ORDER — MIDAZOLAM HCL 5 MG/5ML IJ SOLN
INTRAMUSCULAR | Status: DC | PRN
  Administered 2012-12-22: 2 mg via INTRAVENOUS

## 2012-12-22 MED ORDER — LIDOCAINE HCL (PF) 2 % IJ SOLN
INTRAMUSCULAR | Status: DC | PRN
  Administered 2012-12-22: 75 mg

## 2012-12-22 MED ORDER — POTASSIUM CHLORIDE 10 MEQ/100ML IV SOLN
10.0000 meq | INTRAVENOUS | Status: AC
  Administered 2012-12-22 (×4): 10 meq via INTRAVENOUS
  Filled 2012-12-22 (×4): qty 100

## 2012-12-22 MED ORDER — SODIUM CHLORIDE 0.9 % IV SOLN
INTRAVENOUS | Status: DC
  Administered 2012-12-22: 19:00:00 via INTRAVENOUS

## 2012-12-22 NOTE — Anesthesia Postprocedure Evaluation (Signed)
Anesthesia Post Note  Patient: [name] [name]  Procedure(s) Performed: Procedure(s) (LRB): ENDOSCOPIC RETROGRADE CHOLANGIOPANCREATOGRAPHY (ERCP) (N/A)  Anesthesia type: General  Patient location: PACU  Post pain: Pain level controlled  Post assessment: Post-op Vital signs reviewed  Last Vitals: BP 131/79  Pulse 86  Temp(Src) 36.8 C (Oral)  Resp 15  Ht 5\' 5"  (1.651 m)  Wt 240 lb (108.863 kg)  BMI 39.94 kg/m2  SpO2 99%  Post vital signs: Reviewed  Level of consciousness: sedated  Complications: No apparent anesthesia complications

## 2012-12-22 NOTE — Op Note (Signed)
[facility] [address] [place], [address]   ERCP PROCEDURE REPORT  PATIENT: [name], [name]  MR# :[id number] BIRTHDATE: [birth year]  GENDER: Female ENDOSCOPIST: Iva Boop, MD, [facility] PROCEDURE DATE:  12/22/2012 PROCEDURE:   ERCP with sphincterotomy/papillotomy ASA CLASS:   Class II INDICATIONS:abnormal abdominal ultrasound.   established gallbladder stone.   established acute pancreatitis. 14 mm CBD on [place] MEDICATIONS: Cipro 400 mg IV and General endotracheal anesthesia (GETA) TOPICAL ANESTHETIC: none  DESCRIPTION OF PROCEDURE:   After the risks benefits and alternatives of the procedure were thoroughly explained, informed consent was obtained.  The Pentax ERCP [id number]  endoscope was introduced through the mouth  and advanced to the second portion of the duodenum . The esophagus was not seen well. There was eythematous mucosa in proxinmal stomach that looked like focal gastropathy. Stomach oterwise normal. The duodenum was normal.  The major papilla was in usual position and looked traumatized. The short-wire sphincterotome was inserted and wire-guided biliary cannulation carried out. Initial contrast preferentially filled gallbladder and cystic duct which had low insertion. Gallstones seen. Wire manipulated into intrahepatics on left. CBD and CHD dilated to 12 mm max.there was a round filling defect mid extra-hepatic ducts - not certain if in ducts. Intrahepatics filled. Biliary sphincterotomy performed given clinical scenario and ? filling defect that could be CBD stone. Balloon sweeps x 2 no stones removed. Occlusion cholangiogram negative for CBD stones. Pancreas intentionally not cannulated.  The scope was then completely withdrawn from the patient and the procedure terminated.     COMPLICATIONS: .  There were no complications.  ENDOSCOPIC IMPRESSION: 1.   Gallbladder stone(s) 2.   Dilated common bile duct - 12 mm - no CBD stones - must  have passed. 3.    Focal gastropathy - probably from vomiting with pancreatitis.   RECOMMENDATIONS: 1.  Clear liquids for now cholecystectomy per surgery       eSigned:  Iva Boop, MD, [facility] 12/22/2012 11:28 AM

## 2012-12-22 NOTE — Transfer of Care (Signed)
Immediate Anesthesia Transfer of Care Note  Patient: [name] [name]  Procedure(s) Performed: Procedure(s): ENDOSCOPIC RETROGRADE CHOLANGIOPANCREATOGRAPHY (ERCP) (N/A)  Patient Location: PACU and Endoscopy Unit  Anesthesia Type:General  Level of Consciousness: awake, alert  and patient cooperative  Airway & Oxygen Therapy: Patient Spontanous Breathing and Patient connected to face mask oxygen  Post-op Assessment: Report given to PACU RN, Post -op Vital signs reviewed and stable and patient teary, emotional.  Post vital signs: Reviewed and stable  Complications: No apparent anesthesia complications

## 2012-12-22 NOTE — Progress Notes (Addendum)
INITIAL NUTRITION ASSESSMENT  DOCUMENTATION CODES Per approved criteria  -Obesity Unspecified   INTERVENTION: - Diet advancement per MD - Multivitamin 1 tablet PO daily  - Will continue to monitor   NUTRITION DIAGNOSIS: Altered GI function related to biliary pancreatitis as evidenced by GI notes.   Goal: 1. Advance diet as tolerated to low fat diet 2. No further nausea/vomiting  Monitor:  Weights, labs, diet advancement, nausea/vomiting   Reason for Assessment: Nutrition risk   32 y.o. female  Admitting Dx: Gallstone pancreatitis  ASSESSMENT: Pt around 5 weeks s/p right colectomy for right colon perforation 5 days after her C-section. Pt admitted with upper abdominal pain, nausea, and vomiting. Pt tearful during visit, states she had ERCP today and was told by MD that she has to wait 6 weeks to have gallbladder out but she wanted it out sooner, tired of not feeling well. Emotional support provided.   Met with pt who reports having on/off nausea/vomiting since January of this year. Pt hospitalized last month and was on TPN. Pt reports it doesn't matter the time of day or what she eats, nothing explains the nausea/vomiting. Family reports last episode of emesis occurred after drinking ginger-ale and eating a cracker. Pt reports sometimes she will go 2-3 days without eating. Pt's weight got up to 278 pounds last month but RD suspected this was fluid retention during last admission. Pt currently denies any nausea.   Pt with biliary pancreatitis with progressive common bile duct dilation and cholelithiasis. Noted pt with slightly low potassium and elevated but trending down lipase, AST, and ALT.    Height: Ht Readings from Last 1 Encounters:  12/20/12 5\' 5"  (1.651 m)    Weight: Wt Readings from Last 1 Encounters:  12/20/12 240 lb (108.863 kg)    Ideal Body Weight: 125 lb  % Ideal Body Weight: 192%  Wt Readings from Last 10 Encounters:  12/20/12 240 lb (108.863 kg)   12/20/12 240 lb (108.863 kg)  12/09/12 240 lb (108.863 kg)  11/09/12 278 lb 7.1 oz (126.3 kg)  11/09/12 278 lb 7.1 oz (126.3 kg)  11/03/12 257 lb (116.574 kg)  11/03/12 257 lb (116.574 kg)  10/31/12 257 lb (116.574 kg)  09/24/12 269 lb (122.018 kg)  07/03/12 268 lb 8 oz (121.791 kg)    Usual Body Weight: 270 lb  % Usual Body Weight: 89%  BMI:  Body mass index is 39.94 kg/(m^2). Class II obesity  Estimated Nutritional Needs: Kcal: 1600-1800 Protein: 70-80g Fluid: 1.6-1.8L/day  Skin: Abdominal incision   Diet Order: Clear Liquid  EDUCATION NEEDS: -No education needs identified at this time   Intake/Output Summary (Last 24 hours) at 12/22/12 1416 Last data filed at 12/22/12 1132  Gross per 24 hour  Intake   2200 ml  Output    450 ml  Net   1750 ml    Last BM: 7/20  Labs:   Recent Labs Lab 12/20/12 1657 12/22/12 0355  NA 138 139  K 4.4 3.3*  CL 100 106  CO2 24 25  BUN 8 7  CREATININE 0.49* 0.56  CALCIUM 10.0 8.7  GLUCOSE 137* 92    CBG (last 3)  No results found for this basename: GLUCAP,  in the last 72 hours  Scheduled Meds: . acetaminophen  1,000 mg Intravenous Q6H  . heparin  5,000 Units Subcutaneous Q8H  . pantoprazole (PROTONIX) IV  40 mg Intravenous Q24H  . potassium chloride  10 mEq Intravenous Q1 Hr x 4  Continuous Infusions: . dextrose 5 % and 0.9% NaCl 100 mL/hr at 12/21/12 8295    Past Medical History  Diagnosis Date  . Gallstones   . Cholecystitis chronic, acute   . Heart murmur     no indications for 7-26yrs per pt.  . Asthma     childhood exercise induced only  . Headache(784.0)     migraines    Past Surgical History  Procedure Laterality Date  . Cesarean section N/A 11/03/2012    Procedure: PRIMARY CESAREAN SECTION;  Surgeon: Bing Plume, MD;  Location: [facility];  Service: Obstetrics;  Laterality: N/A;  . Laparotomy N/A 11/08/2012    Procedure: laparoscopic exploration and EXPLORATORY LAPAROTOMY;  Surgeon: Robyne Askew, MD;  Location: [facility];  Service: General;  Laterality: N/A;  . Appendectomy      Levon Hedger MS, RD, LDN [phone] Pager [phone] After Hours Pager

## 2012-12-22 NOTE — Telephone Encounter (Signed)
pt having surgery on gallbladder and wants to cx appts...will call back to r/s

## 2012-12-22 NOTE — Progress Notes (Signed)
[facility] Progress Note  Subjective:  Feeling ok.  No pain for two days.  Ready for ERCP.  Objective:  Vital signs in last 24 hours: Temp:  [97.8 F (36.6 C)-98.9 F (37.2 C)] 98 F (36.7 C) (07/21 0459) Pulse Rate:  [53-64] 53 (07/21 0459) Resp:  [18] 18 (07/21 0459) BP: (95-105)/(60-70) 95/60 mmHg (07/21 0459) SpO2:  [96 %-97 %] 96 % (07/21 0459) Last BM Date: 12/21/12 General:   Alert, Well-developed, in NAD Heart:  Bradycardic but regular rhythm; no murmurs Pulm:  CTAB.  No W/R/R. Abdomen:  Soft, nondistended. BS present.  Minimal epigastric TTP without R/R/G.  Extremities:  Without edema. Neurologic:  Alert and  oriented x4;  grossly normal neurologically. Psych:  Alert and cooperative. Normal mood and affect.  Intake/Output from previous day: 07/20 0701 - 07/21 0700 In: 2400 [I.V.:2400] Out: 1200 [Urine:1200]  Lab Results:  Recent Labs  12/20/12 1657  WBC 10.3  HGB 12.6  HCT 38.2  PLT 252   BMET  Recent Labs  12/20/12 1657 12/22/12 0355  NA 138 139  K 4.4 3.3*  CL 100 106  CO2 24 25  GLUCOSE 137* 92  BUN 8 7  CREATININE 0.49* 0.56  CALCIUM 10.0 8.7   LFT  Recent Labs  12/22/12 0355  PROT 5.9*  ALBUMIN 2.9*  AST 82*  ALT 223*  ALKPHOS 289*  BILITOT 0.7   US Abdomen Complete  12/20/2012   *RADIOLOGY REPORT*  Clinical Data:  32 year old female with right upper quadrant pain nausea and vomiting.  COMPLETE ABDOMINAL ULTRASOUND  Comparison:  07/30/2012.  Findings:  Gallbladder:  Layering sludge in the gallbladder fundus.  Gravel like shadowing echogenic stones.  Individual stones might measure up to 5 mm diameter.  Gallbladder wall remains normal at 1-2 mm thickness.  No sonographic Murphy's sign elicited.  No pericholecystic fluid.  Common bile duct:  Abnormally dilated up to 14 mm, previously 6-7 mm.  Liver:  Positive intrahepatic biliary ductal dilatation.  Normal hepatic echogenicity.  No discrete liver lesion.  IVC:  Appears  normal.  Pancreas:  No focal abnormality seen.  Spleen:  Normal measuring 9.2 cm in length.  Right Kidney:  Normal measuring 11.7 cm in length.  Left Kidney:  Not as well visualized.  Grossly stable and within normal limits.  Approximate length 10.8 cm.  Abdominal aorta:  Incompletely visualized due to overlying bowel gas, visualized portions within normal limits.  IMPRESSION: 1.  Intra and extrahepatic biliary ductal dilatation.  In the setting of chronic gallbladder sludge and small stones distal CBD obstruction due to stone is favored. 2.  No evidence of acute cholecystitis. 3.  Otherwise negative abdominal ultrasound.  Study discussed by telephone with PA Antony Madura on 12/20/2012 at 1925 hours.   Original Report Authenticated By: Erskine Speed, M.D.   Dg Abd Acute W/chest  12/20/2012   *RADIOLOGY REPORT*  Clinical Data: Abdominal pain, nausea/vomiting, asthma  ACUTE ABDOMEN SERIES (ABDOMEN 2 VIEW & CHEST 1 VIEW)  Comparison: Abdominal radiograph dated 11/13/2012  Findings: Lungs are clear. No pleural effusion or pneumothorax.  Cardiomediastinal silhouette is within normal limits.  Nonobstructive bowel gas pattern.  No evidence of free air under the diaphragm on the upright view.  Surgical sutures in the right mid abdomen.  Visualized osseous structures are within normal limits.  IMPRESSION: No evidence of acute cardiopulmonary disease.  No evidence of small bowel obstruction or free air.  Surgical sutures in the right mid abdomen.   Original  Report Authenticated By: Charline Bills, M.D.    Assessment / Plan: 11. 32 year old female with biliary pancreatitis.  Ultrasound reveals progressive CBD dilation and cholelithiasis. LFTs still elevated, but trending down nicely already. Continue supportive care.  ERCP today followed by cholecystectomy in the near future.  2. Cecal perforation following C-section early June, s/p exploratory laparotomy with right colectomy 11/08/12. Carcinoid tumor of appendix on  pathology (felt to be unrelated to perforation per surgery).     LOS: 2 days   Laurence Folz D.  12/22/2012, 8:06 AM  Pager number [phone]

## 2012-12-22 NOTE — Anesthesia Preprocedure Evaluation (Addendum)
Anesthesia Evaluation  Patient identified by MRN, date of birth, ID band Patient awake    Reviewed: Allergy & Precautions, H&P , NPO status , Patient's Chart, lab work & pertinent test results  Airway Mallampati: II TM Distance: >3 FB Neck ROM: Full    Dental no notable dental hx. (+) Dental Advisory Given   Pulmonary asthma ,  breath sounds clear to auscultation  Pulmonary exam normal       Cardiovascular Exercise Tolerance: Good negative cardio ROS  + Valvular Problems/Murmurs Rhythm:Regular Rate:Normal     Neuro/Psych  Headaches, negative psych ROS   GI/Hepatic negative GI ROS, Neg liver ROS,   Endo/Other  Morbid obesity  Renal/GU negative Renal ROS     Musculoskeletal negative musculoskeletal ROS (+)   Abdominal (+) + obese,   Peds  Hematology negative hematology ROS (+)   Anesthesia Other Findings   Reproductive/Obstetrics negative OB ROS                           Anesthesia Physical  Anesthesia Plan  ASA: II and emergent  Anesthesia Plan: General   Post-op Pain Management:    Induction: Intravenous  Airway Management Planned: Oral ETT  Additional Equipment:   Intra-op Plan:   Post-operative Plan: Extubation in OR  Informed Consent: I have reviewed the patients History and Physical, chart, labs and discussed the procedure including the risks, benefits and alternatives for the proposed anesthesia with the patient or authorized representative who has indicated his/her understanding and acceptance.   Dental advisory given  Plan Discussed with: CRNA  Anesthesia Plan Comments:         Anesthesia Quick Evaluation  

## 2012-12-22 NOTE — Progress Notes (Signed)
Patient ID: [name] [name], female   DOB: 1981-04-29, 32 y.o.   MRN: [id number] Gallstone pancreatitis  Subjective: Feels ok today, denies n/v, abd pain. For ERCP today   Relevant history: [name] [name] is a 32 y.o. (DOB: 07-12-1980) white female whose primary care physician is TODD,JEFFREY ALLEN, MD and comes to [facility] ER Sat for abdominal pain.  She has had symptomatic gallstones for some time. She was seen by Dr. Clovis Pu on 09/24/2012 for symptomatic gallstones. Planned surgery was delayed until after her delivery.  She had a C section 11/03/2012 by Dr. Ronnell Freshwater. She has a healthy girl named Ava. Post C section, she developed an ileus and had a serosal tear of her right colon. This required a right colectomy by Dr. Carolynne Edouard on 11/08/2012. Her post op course was complicated by an ileus and open wound.  She was discharged home around 11/18/2012. Her wound has done well. But she has been "sick" 5 times since discharge. Today, she got sick enough and vomited that her mother brought her to the [facility]. He labs point to gallstone pancreatitis. An US shows biliary dilatation.   Objective: Vital signs in last 24 hours: Temp:  [97.8 F (36.6 C)-98.9 F (37.2 C)] 98 F (36.7 C) (07/21 0459) Pulse Rate:  [53-64] 53 (07/21 0459) Resp:  [18] 18 (07/21 0459) BP: (95-105)/(60-70) 95/60 mmHg (07/21 0459) SpO2:  [96 %-97 %] 96 % (07/21 0459) Last BM Date: 12/21/12  Intake/Output from previous day: 07/20 0701 - 07/21 0700 In: 2400 [I.V.:2400] Out: 1200 [Urine:1200] Intake/Output this shift:    General appearance: alert and cooperative, NAD Resp: clear to auscultation bilaterally GI: soft, non-tender   Lab Results:  Results for orders placed during the hospital encounter of 12/20/12 (from the past 24 hour(s))  DIGOXIN LEVEL     Status: Abnormal   Collection Time    12/21/12  1:37 PM      Result Value Range   Digoxin Level <0.3 (*) 0.8 - 2.0 ng/mL  LIPASE, BLOOD     Status: Abnormal   Collection Time   12/22/12  3:55 AM      Result Value Range   Lipase 111 (*) 11 - 59 U/L  COMPREHENSIVE METABOLIC PANEL     Status: Abnormal   Collection Time    12/22/12  3:55 AM      Result Value Range   Sodium 139  135 - 145 mEq/L   Potassium 3.3 (*) 3.5 - 5.1 mEq/L   Chloride 106  96 - 112 mEq/L   CO2 25  19 - 32 mEq/L   Glucose, Bld 92  70 - 99 mg/dL   BUN 7  6 - 23 mg/dL   Creatinine, Ser 4.09  0.50 - 1.10 mg/dL   Calcium 8.7  8.4 - 81.1 mg/dL   Total Protein 5.9 (*) 6.0 - 8.3 g/dL   Albumin 2.9 (*) 3.5 - 5.2 g/dL   AST 82 (*) 0 - 37 U/L   ALT 223 (*) 0 - 35 U/L   Alkaline Phosphatase 289 (*) 39 - 117 U/L   Total Bilirubin 0.7  0.3 - 1.2 mg/dL   GFR calc non Af Amer >90  >90 mL/min   GFR calc Af Amer >90  >90 mL/min     Studies/Results Radiology   MEDS, Scheduled . acetaminophen  1,000 mg Intravenous Q6H  . ciprofloxacin  400 mg Intravenous 60 min Pre-Op  . heparin  5,000 Units Subcutaneous Q8H  . pantoprazole (PROTONIX) IV  40 mg Intravenous Q24H     Assessment: Gallstone pancreatitis  Plan: For ERCP today Follow labs afterwards Will need cholecystectomy once pancreatitis clears   LOS: 2 days    WHITE, [facility], [place] [phone]   12/22/2012 8:29 AM

## 2012-12-22 NOTE — Progress Notes (Addendum)
[facility] PROGRESS NOTE  [name] [name] ZOX:[id number] DOB: Sep 13, 1980 DOA: 12/20/2012 PCP: Evette Georges, MD  Assessment/Plan: 1-Acute gallstone pancreatitis: -continue supportive care -IVF's (will start tapering down IVF rate), PRN analgesics and antiemetics -will start CLD -Patient s/p ERCP and sphincterectomy; no complications -LFT's trending down -Bili WNL now and lipase down from >3000 to 111  2-Cholelithiasis: once pancreatitis is cool off and ERCP completed, surgery and patient to decide on cholecystectomy.  3-HA: secondary to use of narcotics maybe. Improved with  Tylenol and currently no present  4-GERD: continue PPI  5-hypokalemia: will replete and will check Mg level in am  DVT: heparin.  Code Status: Full Family Communication: mother at bedside Disposition Plan: home when medically stable   Consultants:  GI  Surgery  Procedures:  ERCP plan for tomorrow 7/21 (Dr. Leone Payor)  Antibiotics:  Cipro would be given pre-op for ERCP  HPI/Subjective:  Feeling better; denies nausea and vomiting; abd pain improved. Tolerating CLD so far. Patient S/P ERCP with sphincterectomy apparently w/o complications.   Objective: Filed Vitals:   12/22/12 1210 12/22/12 1219 12/22/12 1230 12/22/12 1400  BP: 119/81 126/76 131/79 109/77  Pulse:      Temp:    98.7 F (37.1 C)  TempSrc:    Oral  Resp: 10  15 18   Height:      Weight:      SpO2: 100% 99% 99% 99%    Intake/Output Summary (Last 24 hours) at 12/22/12 1524 Last data filed at 12/22/12 1300  Gross per 24 hour  Intake   2200 ml  Output    850 ml  Net   1350 ml   Filed Weights   12/20/12 2147  Weight: 108.863 kg (240 lb)    Exam:   General:  Feeling better; denies nausea and vomiting; abd pain improved. Tolerating CLD so far.  Cardiovascular: S1 and S2, no rubs or gallops  Respiratory: CTA bilaterally  Abdomen: soft, ND; mild epigastric/midabdomen discomfort with deep palpation;  positive BS  Musculoskeletal: no edema or cyanosis  Data Reviewed: Basic Metabolic Panel:  Recent Labs Lab 12/20/12 1657 12/22/12 0355  NA 138 139  K 4.4 3.3*  CL 100 106  CO2 24 25  GLUCOSE 137* 92  BUN 8 7  CREATININE 0.49* 0.56  CALCIUM 10.0 8.7   Liver Function Tests:  Recent Labs Lab 12/20/12 1657 12/22/12 0355  AST 420* 82*  ALT 492* 223*  ALKPHOS 512* 289*  BILITOT 2.8* 0.7  PROT 8.1 5.9*  ALBUMIN 4.0 2.9*    Recent Labs Lab 12/20/12 1657 12/20/12 1910 12/22/12 0355  LIPASE >3000*  --  111*  AMYLASE  --  1276*  --    CBC:  Recent Labs Lab 12/20/12 1657  WBC 10.3  NEUTROABS 8.9*  HGB 12.6  HCT 38.2  MCV 88.2  PLT 252    Studies: US Abdomen Complete  12/20/2012   *RADIOLOGY REPORT*  Clinical Data:  32 year old female with right upper quadrant pain nausea and vomiting.  COMPLETE ABDOMINAL ULTRASOUND  Comparison:  07/30/2012.  Findings:  Gallbladder:  Layering sludge in the gallbladder fundus.  Gravel like shadowing echogenic stones.  Individual stones might measure up to 5 mm diameter.  Gallbladder wall remains normal at 1-2 mm thickness.  No sonographic Murphy's sign elicited.  No pericholecystic fluid.  Common bile duct:  Abnormally dilated up to 14 mm, previously 6-7 mm.  Liver:  Positive intrahepatic biliary ductal dilatation.  Normal hepatic echogenicity.  No discrete liver lesion.  IVC:  Appears normal.  Pancreas:  No focal abnormality seen.  Spleen:  Normal measuring 9.2 cm in length.  Right Kidney:  Normal measuring 11.7 cm in length.  Left Kidney:  Not as well visualized.  Grossly stable and within normal limits.  Approximate length 10.8 cm.  Abdominal aorta:  Incompletely visualized due to overlying bowel gas, visualized portions within normal limits.  IMPRESSION: 1.  Intra and extrahepatic biliary ductal dilatation.  In the setting of chronic gallbladder sludge and small stones distal CBD obstruction due to stone is favored. 2.  No evidence of  acute cholecystitis. 3.  Otherwise negative abdominal ultrasound.  Study discussed by telephone with PA Antony Madura on 12/20/2012 at 1925 hours.   Original Report Authenticated By: Erskine Speed, M.D.   Dg Ercp Biliary & Pancreatic Ducts  12/22/2012   *RADIOLOGY REPORT*  Clinical Data: Dilated common bile duct.  Pancreatitis.  ERCP  Comparison: None.  Findings: ERCP was performed.  Sphincterotomy with balloon sweep was performed.  No stones were retrieved.  There appear to be stones in the cystic duct.  On the last available image there are no stones in the common hepatic or common bile duct or in the visualized intrahepatic ducts.  IMPRESSION: No common bile duct stones.  Stones present in the cystic duct.   Original Report Authenticated By: Francene Boyers, M.D.   Dg Abd Acute W/chest  12/20/2012   *RADIOLOGY REPORT*  Clinical Data: Abdominal pain, nausea/vomiting, asthma  ACUTE ABDOMEN SERIES (ABDOMEN 2 VIEW & CHEST 1 VIEW)  Comparison: Abdominal radiograph dated 11/13/2012  Findings: Lungs are clear. No pleural effusion or pneumothorax.  Cardiomediastinal silhouette is within normal limits.  Nonobstructive bowel gas pattern.  No evidence of free air under the diaphragm on the upright view.  Surgical sutures in the right mid abdomen.  Visualized osseous structures are within normal limits.  IMPRESSION: No evidence of acute cardiopulmonary disease.  No evidence of small bowel obstruction or free air.  Surgical sutures in the right mid abdomen.   Original Report Authenticated By: Charline Bills, M.D.    Scheduled Meds: . acetaminophen  1,000 mg Intravenous Q6H  . heparin  5,000 Units Subcutaneous Q8H  . multivitamin with minerals  1 tablet Oral Daily  . pantoprazole (PROTONIX) IV  40 mg Intravenous Q24H  . potassium chloride  10 mEq Intravenous Q1 Hr x 4   Continuous Infusions: . dextrose 5 % and 0.9% NaCl 100 mL/hr at 12/21/12 1610    Principal Problem:   Gallstone pancreatitis Active  Problems:   Overweight(278.02)   Cholelithiasis   Carcinoid tumor of appendix, malignant    Time spent: >30 minutes    [name]  [facility] Pager [phone]. If 7PM-7AM, please contact night-coverage at [email or web address], password [facility] 12/22/2012, 3:24 PM  LOS: 2 days

## 2012-12-22 NOTE — Transfer of Care (Deleted)
Immediate Anesthesia Transfer of Care Note  Patient: [name] [name]  Procedure(s) Performed: Procedure(s): ENDOSCOPIC RETROGRADE CHOLANGIOPANCREATOGRAPHY (ERCP) (N/A)  Patient Location: PACU  Anesthesia Type:General  Level of Consciousness: sedated  Airway & Oxygen Therapy: Patient Spontanous Breathing and Patient connected to face mask oxygen  Post-op Assessment: Report given to PACU RN and Post -op Vital signs reviewed and stable  Post vital signs: Reviewed and stable  Complications: No apparent anesthesia complications

## 2012-12-23 ENCOUNTER — Encounter (HOSPITAL_COMMUNITY)
Admission: EM | Disposition: A | Discharge status: Home / Self Care | Payer: Self-pay | Source: Home / Self Care | Attending: Internal Medicine

## 2012-12-23 ENCOUNTER — Inpatient Hospital Stay (HOSPITAL_COMMUNITY): Payer: Medicaid Other

## 2012-12-23 ENCOUNTER — Encounter (HOSPITAL_COMMUNITY): Payer: Self-pay | Admitting: Internal Medicine

## 2012-12-23 ENCOUNTER — Encounter (HOSPITAL_COMMUNITY): Payer: Self-pay | Admitting: Anesthesiology

## 2012-12-23 ENCOUNTER — Inpatient Hospital Stay (HOSPITAL_COMMUNITY): Payer: Medicaid Other | Admitting: Anesthesiology

## 2012-12-23 DIAGNOSIS — K8010 Calculus of gallbladder with chronic cholecystitis without obstruction: Secondary | ICD-10-CM

## 2012-12-23 HISTORY — PX: CHOLECYSTECTOMY: SHX55

## 2012-12-23 LAB — CREATININE, SERUM: Creatinine, Ser: 0.56 mg/dL (ref 0.50–1.10)

## 2012-12-23 LAB — CBC
MCH: 28.6 pg (ref 26.0–34.0)
MCHC: 31.9 g/dL (ref 30.0–36.0)
Platelets: 195 10*3/uL (ref 150–400)
RBC: 3.74 MIL/uL — ABNORMAL LOW (ref 3.87–5.11)
RDW: 13.9 % (ref 11.5–15.5)

## 2012-12-23 LAB — COMPREHENSIVE METABOLIC PANEL
ALT: 176 U/L — ABNORMAL HIGH (ref 0–35)
AST: 60 U/L — ABNORMAL HIGH (ref 0–37)
Alkaline Phosphatase: 292 U/L — ABNORMAL HIGH (ref 39–117)
CO2: 27 mEq/L (ref 19–32)
Chloride: 104 mEq/L (ref 96–112)
Creatinine, Ser: 0.58 mg/dL (ref 0.50–1.10)
GFR calc non Af Amer: >90 mL/min (ref >90)
Sodium: 139 mEq/L (ref 135–145)
Total Bilirubin: 0.8 mg/dL (ref 0.3–1.2)

## 2012-12-23 SURGERY — LAPAROSCOPIC CHOLECYSTECTOMY WITH INTRAOPERATIVE CHOLANGIOGRAM
Anesthesia: General | Site: Abdomen | Wound class: Clean Contaminated

## 2012-12-23 MED ORDER — GLYCOPYRROLATE 0.2 MG/ML IJ SOLN
INTRAMUSCULAR | Status: DC | PRN
  Administered 2012-12-23: 0.6 mg via INTRAVENOUS

## 2012-12-23 MED ORDER — ONDANSETRON HCL 4 MG/2ML IJ SOLN
INTRAMUSCULAR | Status: DC | PRN
  Administered 2012-12-23: 4 mg via INTRAVENOUS

## 2012-12-23 MED ORDER — KCL IN DEXTROSE-NACL 20-5-0.45 MEQ/L-%-% IV SOLN
INTRAVENOUS | Status: DC
  Administered 2012-12-23 – 2012-12-24 (×3): via INTRAVENOUS
  Filled 2012-12-23 (×4): qty 1000

## 2012-12-23 MED ORDER — ONDANSETRON HCL 4 MG PO TABS: 4.0000 mg | ORAL_TABLET | Freq: Four times a day (QID) | ORAL | Status: DC | PRN

## 2012-12-23 MED ORDER — NEOSTIGMINE METHYLSULFATE 1 MG/ML IJ SOLN
INTRAMUSCULAR | Status: DC | PRN
  Administered 2012-12-23: 5 mg via INTRAVENOUS

## 2012-12-23 MED ORDER — ACETAMINOPHEN 325 MG PO TABS: 650.0000 mg | ORAL_TABLET | ORAL | Status: DC | PRN

## 2012-12-23 MED ORDER — DEXAMETHASONE SODIUM PHOSPHATE 10 MG/ML IJ SOLN
INTRAMUSCULAR | Status: DC | PRN
  Administered 2012-12-23: 10 mg via INTRAVENOUS

## 2012-12-23 MED ORDER — HYDROMORPHONE HCL PF 1 MG/ML IJ SOLN
0.2500 mg | INTRAMUSCULAR | Status: DC | PRN
  Administered 2012-12-23 (×2): 0.25 mg via INTRAVENOUS
  Administered 2012-12-23: 0.5 mg via INTRAVENOUS

## 2012-12-23 MED ORDER — PROMETHAZINE HCL 25 MG/ML IJ SOLN: 6.2500 mg | INTRAMUSCULAR | Status: DC | PRN

## 2012-12-23 MED ORDER — OXYCODONE HCL 5 MG PO TABS: 5.0000 mg | ORAL_TABLET | Freq: Once | ORAL | Status: DC | PRN

## 2012-12-23 MED ORDER — IOHEXOL 300 MG/ML  SOLN
INTRAMUSCULAR | Status: DC | PRN
  Administered 2012-12-23: 23 mL

## 2012-12-23 MED ORDER — BUPIVACAINE LIPOSOME 1.3 % IJ SUSP
INTRAMUSCULAR | Status: DC | PRN
  Administered 2012-12-23: 20 mL

## 2012-12-23 MED ORDER — OXYCODONE-ACETAMINOPHEN 5-325 MG PO TABS: 1.0000 | ORAL_TABLET | ORAL | Status: DC | PRN

## 2012-12-23 MED ORDER — MEPERIDINE HCL 50 MG/ML IJ SOLN: 6.2500 mg | INTRAMUSCULAR | Status: DC | PRN

## 2012-12-23 MED ORDER — SODIUM CHLORIDE 0.9 % IR SOLN
Status: DC | PRN
  Administered 2012-12-23: 1000 mL

## 2012-12-23 MED ORDER — SUCCINYLCHOLINE CHLORIDE 20 MG/ML IJ SOLN
INTRAMUSCULAR | Status: DC | PRN
  Administered 2012-12-23: 100 mg via INTRAVENOUS

## 2012-12-23 MED ORDER — ACETAMINOPHEN 10 MG/ML IV SOLN
1000.0000 mg | Freq: Once | INTRAVENOUS | Status: DC | PRN
  Filled 2012-12-23: qty 100

## 2012-12-23 MED ORDER — HEPARIN SODIUM (PORCINE) 5000 UNIT/ML IJ SOLN
5000.0000 [IU] | Freq: Three times a day (TID) | INTRAMUSCULAR | Status: DC
  Administered 2012-12-23 – 2012-12-24 (×2): 5000 [IU] via SUBCUTANEOUS
  Filled 2012-12-23 (×5): qty 1

## 2012-12-23 MED ORDER — LACTATED RINGERS IR SOLN
Status: DC | PRN
  Administered 2012-12-23: 1000 mL

## 2012-12-23 MED ORDER — FENTANYL CITRATE 0.05 MG/ML IJ SOLN
INTRAMUSCULAR | Status: DC | PRN
  Administered 2012-12-23 (×2): 50 ug via INTRAVENOUS
  Administered 2012-12-23 (×2): 100 ug via INTRAVENOUS
  Administered 2012-12-23 (×3): 50 ug via INTRAVENOUS

## 2012-12-23 MED ORDER — POTASSIUM CHLORIDE 10 MEQ/100ML IV SOLN
10.0000 meq | INTRAVENOUS | Status: DC
  Filled 2012-12-23 (×4): qty 100

## 2012-12-23 MED ORDER — HYDROMORPHONE HCL PF 1 MG/ML IJ SOLN: 0.5000 mg | INTRAMUSCULAR | Status: DC | PRN

## 2012-12-23 MED ORDER — ONDANSETRON HCL 4 MG/2ML IJ SOLN: 4.0000 mg | Freq: Four times a day (QID) | INTRAMUSCULAR | Status: DC | PRN

## 2012-12-23 MED ORDER — BUPIVACAINE LIPOSOME 1.3 % IJ SUSP
20.0000 mL | Freq: Once | INTRAMUSCULAR | Status: DC
  Filled 2012-12-23: qty 20

## 2012-12-23 MED ORDER — LACTATED RINGERS IV SOLN
INTRAVENOUS | Status: DC
  Administered 2012-12-23 (×2): via INTRAVENOUS
  Administered 2012-12-23: 1000 mL via INTRAVENOUS

## 2012-12-23 MED ORDER — MIDAZOLAM HCL 5 MG/5ML IJ SOLN
INTRAMUSCULAR | Status: DC | PRN
  Administered 2012-12-23: 2 mg via INTRAVENOUS

## 2012-12-23 MED ORDER — HYDROMORPHONE HCL PF 1 MG/ML IJ SOLN
INTRAMUSCULAR | Status: DC | PRN
  Administered 2012-12-23: 1 mg via INTRAVENOUS

## 2012-12-23 MED ORDER — PROPOFOL 10 MG/ML IV BOLUS
INTRAVENOUS | Status: DC | PRN
  Administered 2012-12-23: 160 mg via INTRAVENOUS
  Administered 2012-12-23: 80 mg via INTRAVENOUS

## 2012-12-23 MED ORDER — DEXTROSE 5 % IV SOLN
2.0000 g | Freq: Once | INTRAVENOUS | Status: AC
  Administered 2012-12-23: 2 g via INTRAVENOUS
  Filled 2012-12-23: qty 2

## 2012-12-23 MED ORDER — OXYCODONE HCL 5 MG/5ML PO SOLN
5.0000 mg | Freq: Once | ORAL | Status: DC | PRN
  Filled 2012-12-23: qty 5

## 2012-12-23 MED ORDER — ROCURONIUM BROMIDE 100 MG/10ML IV SOLN
INTRAVENOUS | Status: DC | PRN
  Administered 2012-12-23: 30 mg via INTRAVENOUS
  Administered 2012-12-23 (×2): 10 mg via INTRAVENOUS

## 2012-12-23 MED ORDER — PHENYLEPHRINE HCL 10 MG/ML IJ SOLN
INTRAMUSCULAR | Status: DC | PRN
  Administered 2012-12-23 (×6): 80 ug via INTRAVENOUS

## 2012-12-23 MED ORDER — LIDOCAINE HCL (CARDIAC) 20 MG/ML IV SOLN
INTRAVENOUS | Status: DC | PRN
  Administered 2012-12-23: 100 mg via INTRAVENOUS

## 2012-12-23 MED FILL — Glucagon HCl (rDNA) For Inj 1 MG (Base Equiv): INTRAMUSCULAR | Qty: 2 | Status: AC

## 2012-12-23 SURGICAL SUPPLY — 39 items
APPLIER CLIP 5 13 M/L LIGAMAX5 (MISCELLANEOUS)
APPLIER CLIP ROT 10 11.4 M/L (STAPLE) ×2
BENZOIN TINCTURE PRP APPL 2/3 (GAUZE/BANDAGES/DRESSINGS) ×2 IMPLANT
CABLE HIGH FREQUENCY MONO STRZ (ELECTRODE) IMPLANT
CANISTER SUCTION 2500CC (MISCELLANEOUS) ×2 IMPLANT
CATH REDDICK CHOLANGI 4FR 50CM (CATHETERS) IMPLANT
CLIP APPLIE 5 13 M/L LIGAMAX5 (MISCELLANEOUS) IMPLANT
CLIP APPLIE ROT 10 11.4 M/L (STAPLE) ×1 IMPLANT
CLOTH BEACON ORANGE TIMEOUT ST (SAFETY) ×2 IMPLANT
COVER MAYO STAND STRL (DRAPES) ×2 IMPLANT
COVER SURGICAL LIGHT HANDLE (MISCELLANEOUS) ×2 IMPLANT
DECANTER SPIKE VIAL GLASS SM (MISCELLANEOUS) ×2 IMPLANT
DRAPE C-ARM 42X120 X-RAY (DRAPES) ×2 IMPLANT
DRAPE LAPAROSCOPIC ABDOMINAL (DRAPES) ×2 IMPLANT
ELECT REM PT RETURN 9FT ADLT (ELECTROSURGICAL) ×2
ELECTRODE REM PT RTRN 9FT ADLT (ELECTROSURGICAL) ×1 IMPLANT
GLOVE BIOGEL M 8.0 STRL (GLOVE) ×2 IMPLANT
GOWN STRL NON-REIN LRG LVL3 (GOWN DISPOSABLE) ×2 IMPLANT
GOWN STRL REIN XL XLG (GOWN DISPOSABLE) ×4 IMPLANT
HEMOSTAT SURGICEL 4X8 (HEMOSTASIS) IMPLANT
IV CATH 14GX2 1/4 (CATHETERS) ×2 IMPLANT
KIT BASIN OR (CUSTOM PROCEDURE TRAY) ×2 IMPLANT
NS IRRIG 1000ML POUR BTL (IV SOLUTION) ×2 IMPLANT
POUCH SPECIMEN RETRIEVAL 10MM (ENDOMECHANICALS) ×2 IMPLANT
SCISSORS LAP 5X35 DISP (ENDOMECHANICALS) ×2 IMPLANT
SET IRRIG TUBING LAPAROSCOPIC (IRRIGATION / IRRIGATOR) ×2 IMPLANT
SLEEVE Z-THREAD 5X100MM (TROCAR) IMPLANT
SOLUTION ANTI FOG 6CC (MISCELLANEOUS) ×2 IMPLANT
STRIP CLOSURE SKIN 1/2X4 (GAUZE/BANDAGES/DRESSINGS) ×2 IMPLANT
SUT VIC AB 4-0 SH 18 (SUTURE) ×2 IMPLANT
SYR 30ML LL (SYRINGE) ×2 IMPLANT
TOWEL OR 17X26 10 PK STRL BLUE (TOWEL DISPOSABLE) ×4 IMPLANT
TRAY LAP CHOLE (CUSTOM PROCEDURE TRAY) ×2 IMPLANT
TROCAR BLADELESS OPT 5 75 (ENDOMECHANICALS) ×10 IMPLANT
TROCAR XCEL BLUNT TIP 100MML (ENDOMECHANICALS) ×2 IMPLANT
TROCAR XCEL NON-BLD 11X100MML (ENDOMECHANICALS) IMPLANT
TROCAR Z-THREAD FIOS 11X100 BL (TROCAR) ×2 IMPLANT
TROCAR Z-THREAD FIOS 5X100MM (TROCAR) IMPLANT
TUBING INSUFFLATION 10FT LAP (TUBING) ×2 IMPLANT

## 2012-12-23 NOTE — Interval H&P Note (Signed)
History and Physical Interval Note:  12/23/2012 11:53 AM  [name] [name]  has presented today for surgery, with the diagnosis of cholecystitis  The various methods of treatment have been discussed with the patient and family. After consideration of risks, benefits and other options for treatment, the patient has consented to  Procedure(s) with comments: LAPAROSCOPIC CHOLECYSTECTOMY WITH INTRAOPERATIVE CHOLANGIOGRAM (N/A) - Laparoscopic Cholecystectomy with IOC, possible open as a surgical intervention .  The patient's history has been reviewed, patient examined, no change in status, stable for surgery.  I have reviewed the patient's chart and labs.  Questions were answered to the patient's satisfaction.     [name]

## 2012-12-23 NOTE — Op Note (Signed)
Surgeon: Wenda Low, MD, FACS  Asst:  Aris [place], PA  Anes:  Dicky Doe.  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 Post C-section right hemicolectomy 6 weeks ago  Complications: none  EBL:   15 cc  Description of Procedure:  The patient was taken to room 6 at [facility] and given general anesthesia. The abdomen which included a lower midline delayed primary closure incision was prepped with PCMX and draped sterilely. Access to the abdomen was achieved to the left upper quadrant with a 5 mm Optiview technique without difficulty. Following insufflation a placed another port in the left side and through that I was able to lyse the adhesions were stuck up to the lower midline where she had had her laparotomy. A 5 mm port was placed above the umbilicus for the camera and a second port was placed laterally on the left to grasp the gallbladder and it 11 mm was placed in the upper midline. The gallbladder was stuck and walled off by omentum tacking. This was dissected away with sharp dissection and blunt dissection and successfully freed. Kalos triangle was then dissected free and critical view was achieved. Clip was placed upon the cystic duct the cystic duct was incised and milked. Reddick catheter was inserted and a dynamic cholangiogram was obtained which showed a very large common bile duct with intrahepatic filling and filling down to the ampulla with slow filling into the duodenum.  No stones were seen in this comes in the face of a prior ERCP. The cystic duct was then triple clipped and divided and the cystic artery was double clipped and divided and then the gallbladder was removed from the gallbladder bed using a hook electrocautery. Difficulty of this was mainly in the gallbladder was so long and floppy. I was however able to resect this from the gallbladder bed and then  placed in a new nylon ripstop bag and extracted through the 11 mm port in the upper midline. I went back and looked at the gallbladder bed no bleeding or bile leaks were noted. Serial was irrigated with saline. Port sites were injected with Exparel and were closed with staples because of her previous open wound in the lower midline although I used a couple of 4-0 Vicryl's in the upper midline as well. All ports were placed obliquely in particular the 11 mm and I felt there was no need to close the fascia because of the obliquity of this trocar placement.  Patient are the procedure well and was taken to the recovery in satisfactory condition.  Matt B. Daphine Deutscher, MD, [facility], [place] [phone]

## 2012-12-23 NOTE — Anesthesia Postprocedure Evaluation (Signed)
Anesthesia Post Note  Patient: [name] [name]  Procedure(s) Performed: Procedure(s) (LRB): LAPAROSCOPIC CHOLECYSTECTOMY WITH INTRAOPERATIVE CHOLANGIOGRAM (N/A)  Anesthesia type: General  Patient location: PACU  Post pain: Pain level controlled  Post assessment: Post-op Vital signs reviewed  Last Vitals: BP 124/69  Pulse 68  Temp(Src) 36.8 C (Oral)  Resp 16  Ht 5\' 5"  (1.651 m)  Wt 240 lb (108.863 kg)  BMI 39.94 kg/m2  SpO2 95%  Post vital signs: Reviewed  Level of consciousness: sedated  Complications: No apparent anesthesia complications

## 2012-12-23 NOTE — H&P (View-Only) (Signed)
 Re:   [name] [name] DOB:   10/16/1980 MRN:   [id number]  General Surgery Consult  ASSESSMENT AND PLAN: 1.  Pancreatitis  Probably secondary to gall stones  Discussed with Dr. Lee.    Plan:  Bowel rest, IVF, follow labs  2.  Cholelithiasis  Dilated biliary system with probable common bile duct stones  Will need GI consult and evaluation 3.  Right colectomy for serosal tear - 11/08/2012 - P. Toth 4.  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  C-Section - 11/03/2012  Daughter named Ava 6.  History of asthma, no current issues 7.  Open wound just about healed   Chief Complaint  Patient presents with  . Abdominal Pain    Plus n/v--known cholelithiasis   REFERRING PHYSICIAN:  Dr. Wofford, [facility]  HISTORY OF PRESENT ILLNESS: [name] [name] is a 32 y.o. (DOB: 07/23/1980)  white  female whose primary care physician is TODD,JEFFREY ALLEN, MD and comes to [facility] ER today for abdominal pain.  Her mother is at the bedside.  She has had symptomatic gallstones for some time.  She was seen by Dr. A. Thomas on 09/24/2012 for symptomatic gallstones.  Planned surgery was delayed until after her delivery.  She had a C section 11/03/2012 by Dr. T. Henley.  She has a healthy girl named Ava.  Post C section, she developed an ileus and had a serosal tear of her right colon.  This required a right colectomy by Dr. Toth on 11/08/2012.  Her post op course was complicated by an ileus and open wound.   She was discharged home around 11/18/2012.  Her wound has done well.  But she has been "sick" 5 times since discharge.  Today, she got sick enough and vomited that her mother brought her to the [facility].  He labs point to pancreatitis.  An US shows biliary dilatation.    Amylase - 1276, Lipase > 3000 - 12/20/2012 LFT's - T. Bili - 2.8, Alk Phos - 512, ALT - 492 - 12/20/2012 US - intra and extrahepatic duct dilatation, no evidence of cholecystitis. -  12/20/2012    Past Medical History  Diagnosis Date  . Gallstones   . Cholecystitis chronic, acute   . Heart murmur     no indications for 7-8yrs per pt.  . Asthma     childhood exercise induced only  . Headache(784.0)     migraines      Past Surgical History  Procedure Laterality Date  . No past surgeries    . Cesarean section N/A 11/03/2012    Procedure: PRIMARY CESAREAN SECTION;  Surgeon: Thomas F Henley, MD;  Location: [facility] ORS;  Service: Obstetrics;  Laterality: N/A;  . Laparotomy N/A 11/08/2012    Procedure: laparoscopic exploration and EXPLORATORY LAPAROTOMY;  Surgeon: Paul S Toth III, MD;  Location: [facility]S;  Service: General;  Laterality: N/A;      Current Facility-Administered Medications  Medication Dose Route Frequency Provider Last Rate Last Dose  . sodium chloride 0.9 % bolus 1,000 mL  1,000 mL Intravenous Once Kelly Humes, PA-C       Current Outpatient Prescriptions  Medication Sig Dispense Refill  . diphenhydrAMINE (BENADRYL) 25 mg capsule Take 25 mg by mouth every 6 (six) hours as needed for itching.      . ibuprofen (ADVIL,MOTRIN) 600 MG tablet Take 1 tablet (600 mg total) by mouth every 6 (six) hours as needed for pain.    30 tablet  0  . omeprazole (PRILOSEC) 40 MG capsule Take 40 mg by mouth daily.      . oxyCODONE-acetaminophen (PERCOCET/ROXICET) 5-325 MG per tablet Take 1-2 tablets by mouth every 4 (four) hours as needed.  60 tablet  0  . promethazine (PHENERGAN) 12.5 MG tablet Take 1 tablet (12.5 mg total) by mouth every 6 (six) hours as needed for nausea.  10 tablet  1     No Known Allergies  REVIEW OF SYSTEMS: Skin:  No history of rash.  No history of abnormal moles. Infection:  No history of hepatitis or HIV.  No history of MRSA. Neurologic:  No history of stroke.  No history of seizure.  No history of headaches. Cardiac:  No history of hypertension. No history of heart disease.  No history of prior cardiac catheterization.  No history of seeing a  cardiologist. Pulmonary:  Does not smoke cigarettes.  No asthma or bronchitis.  No OSA/CPAP.  Endocrine:  No diabetes. No thyroid disease. Gastrointestinal:  See HPI.   Urologic:  No history of kidney stones.  No history of bladder infections. GYN:  C section for term infant - 11/03/2012.  She has 2 other children, 10 and 11 Musculoskeletal:  No history of joint or back disease. Hematologic:  No bleeding disorder.  No history of anemia.  Not anticoagulated. Psycho-social:  The patient is oriented.   The patient has no obvious psychologic or social impairment to understanding our conversation and plan.  SOCIAL and FAMILY HISTORY: Unmarried. Boyfriend, Joseph Phillips, and father of last child lives with her. She worked at [employer] until Jan 2014.  She has been out of work since then. She has 3 children - 11, 10, and 6 weeks old. Her mother is at the bedside.  PHYSICAL EXAM: BP 124/75  Pulse 75  Temp(Src) 98.9 F (37.2 C) (Oral)  Resp 13  SpO2 99%  General: WN WF who is alert and generally healthy appearing.  HEENT: Normal. Pupils equal. Neck: Supple. No mass.  No thyroid mass. Lymph Nodes:  No supraclavicular or cervical nodes. Lungs: Clear to auscultation and symmetric breath sounds. Heart:  RRR. No murmur or rub. Abdomen: Soft. No mass. No tenderness.  Decreased bowel sounds.  Almost healed midline wound. Rectal: Not done. Extremities:  Good strength and ROM  in upper and lower extremities. Neurologic:  Grossly intact to motor and sensory function. Psychiatric: Has normal mood and affect. Behavior is normal.   DATA REVIEWED: Labs and Epic notes.  Aarna Mihalko, MD,  FACS [facility] [address]  [address]   [place], [place]    [address] Phone:  [phone] FAX:  [phone]  

## 2012-12-23 NOTE — Progress Notes (Signed)
Patient ID: [name] [name], female   DOB: 1981-02-04, 32 y.o.   MRN: [id number]  Gallstone pancreatitis  Subjective: Understandably frustrated with her situation, hasn't been able to eat well for weeks due to nausea/vomiting, wants to proceed with surgery today.   Relevant history: [name] [name] is a 32 y.o. (DOB: [birth year]) white female whose primary care physician is TODD,JEFFREY ALLEN, MD and comes to [facility] ER Sat for abdominal pain.  She has had symptomatic gallstones for some time. She was seen by Dr. Clovis Pu on 09/24/2012 for symptomatic gallstones. Planned surgery was delayed until after her delivery.  She had a C section 11/03/2012 by Dr. Ronnell Freshwater. She has a healthy girl named Ava. Post C section, she developed an ileus and had a serosal tear of her right colon. This required a right colectomy by Dr. Carolynne Edouard on 11/08/2012. Her post op course was complicated by an ileus and open wound.  She was discharged home around 11/18/2012. Her wound has done well. But she has been "sick" 5 times since discharge. Today, she got sick enough and vomited that her mother brought her to the [facility]. He labs point to gallstone pancreatitis. An US shows biliary dilatation.   Objective: Vital signs in last 24 hours: Temp:  [98.2 F (36.8 C)-99.1 F (37.3 C)] 98.2 F (36.8 C) (07/22 0551) Pulse Rate:  [56-86] 57 (07/22 0551) Resp:  [8-20] 16 (07/22 0551) BP: (109-133)/(67-81) 133/78 mmHg (07/22 0551) SpO2:  [96 %-100 %] 96 % (07/22 0551) Last BM Date: 12/21/12  Intake/Output from previous day: 07/21 0701 - 07/22 0700 In: 1891.7 [P.O.:360; I.V.:1131.7; IV Piggyback:400] Out: 2450 [Urine:2450] Intake/Output this shift:    General appearance: alert and cooperative, NAD Resp: clear to auscultation bilaterally GI: soft, non-tender   Lab Results:  Results for orders placed during the hospital encounter of 12/20/12 (from the past 24 hour(s))  COMPREHENSIVE METABOLIC PANEL     Status: Abnormal   Collection  Time    12/23/12  3:47 AM      Result Value Range   Sodium 139  135 - 145 mEq/L   Potassium 3.3 (*) 3.5 - 5.1 mEq/L   Chloride 104  96 - 112 mEq/L   CO2 27  19 - 32 mEq/L   Glucose, Bld 89  70 - 99 mg/dL   BUN 4 (*) 6 - 23 mg/dL   Creatinine, Ser 0.86  0.50 - 1.10 mg/dL   Calcium 9.1  8.4 - 57.8 mg/dL   Total Protein 6.1  6.0 - 8.3 g/dL   Albumin 3.1 (*) 3.5 - 5.2 g/dL   AST 60 (*) 0 - 37 U/L   ALT 176 (*) 0 - 35 U/L   Alkaline Phosphatase 292 (*) 39 - 117 U/L   Total Bilirubin 0.8  0.3 - 1.2 mg/dL   GFR calc non Af Amer >90  >90 mL/min   GFR calc Af Amer >90  >90 mL/min  LIPASE, BLOOD     Status: Abnormal   Collection Time    12/23/12  3:47 AM      Result Value Range   Lipase 110 (*) 11 - 59 U/L  MAGNESIUM     Status: None   Collection Time    12/23/12  3:47 AM      Result Value Range   Magnesium 1.7  1.5 - 2.5 mg/dL     Studies/Results Radiology   MEDS, Scheduled . heparin  5,000 Units Subcutaneous Q8H  . multivitamin with minerals  1 tablet  Oral Daily  . pantoprazole (PROTONIX) IV  40 mg Intravenous Q24H     Assessment: Gallstone pancreatitis  Plan: Will post for laparoscopic cholecystectomy with likely conversion to open with IOC Understands risk is high for open conversion and that there are higher risk given her other recent surgeries.     LOS: 3 days    WHITE, [facility], [place] [phone]   12/23/2012 8:11 AM

## 2012-12-23 NOTE — Anesthesia Preprocedure Evaluation (Signed)
Anesthesia Evaluation  Patient identified by MRN, date of birth, ID band Patient awake    Reviewed: Allergy & Precautions, H&P , NPO status , Patient's Chart, lab work & pertinent test results  Airway Mallampati: II TM Distance: >3 FB Neck ROM: Full    Dental no notable dental hx. (+) Dental Advisory Given   Pulmonary asthma ,  breath sounds clear to auscultation  Pulmonary exam normal       Cardiovascular Exercise Tolerance: Good negative cardio ROS  + Valvular Problems/Murmurs Rhythm:Regular Rate:Normal     Neuro/Psych  Headaches, negative psych ROS   GI/Hepatic negative GI ROS, Neg liver ROS,   Endo/Other  Morbid obesity  Renal/GU negative Renal ROS     Musculoskeletal negative musculoskeletal ROS (+)   Abdominal (+) + obese,   Peds  Hematology negative hematology ROS (+)   Anesthesia Other Findings   Reproductive/Obstetrics negative OB ROS                           Anesthesia Physical  Anesthesia Plan  ASA: II and emergent  Anesthesia Plan: General   Post-op Pain Management:    Induction: Intravenous  Airway Management Planned: Oral ETT  Additional Equipment:   Intra-op Plan:   Post-operative Plan: Extubation in OR  Informed Consent: I have reviewed the patients History and Physical, chart, labs and discussed the procedure including the risks, benefits and alternatives for the proposed anesthesia with the patient or authorized representative who has indicated his/her understanding and acceptance.   Dental advisory given  Plan Discussed with: CRNA  Anesthesia Plan Comments:         Anesthesia Quick Evaluation

## 2012-12-23 NOTE — Transfer of Care (Signed)
Immediate Anesthesia Transfer of Care Note  Patient: [name] [name]  Procedure(s) Performed: Procedure(s) with comments: LAPAROSCOPIC CHOLECYSTECTOMY WITH INTRAOPERATIVE CHOLANGIOGRAM (N/A) - Laparoscopic Cholecystectomy with IOC  Patient Location: PACU  Anesthesia Type:General  Level of Consciousness: sedated  Airway & Oxygen Therapy: Patient Spontanous Breathing and Patient connected to face mask oxygen  Post-op Assessment: Report given to PACU RN and Post -op Vital signs reviewed and stable  Post vital signs: Reviewed and stable  Complications: No apparent anesthesia complications

## 2012-12-23 NOTE — Progress Notes (Signed)
[facility] PROGRESS NOTE  [name] [name] ZOX:[id number] DOB: 1980-11-10 DOA: 12/20/2012 PCP: Evette Georges, MD  Assessment/Plan: 1-Acute gallstone pancreatitis: -continue supportive care -IVF's (will start tapering down IVF rate), PRN analgesics and antiemetics -NPO for cholecystectomy later today -Patient s/p ERCP and sphincterectomy; no complications -LFT's trending down -Bili WNL now and lipase down from >3000 to 110  2-Cholelithiasis: pancreatitis improved and stable for surgery. ERCP completed and s/p sphincterectomy. Plan is for cholecystectomy today.  3-HA: secondary to use of narcotics maybe. Improved with  Tylenol and currently no present. Resolved  4-GERD: continue PPI  5-hypokalemia: will replete and Mg level WNL. K 3.3 due to decrease PO intake.  DVT: heparin.  Code Status: Full Family Communication: mother at bedside Disposition Plan: home when medically stable   Consultants:  GI  Surgery  Procedures:  ERCP 7/21 (Dr. Leone Payor), s/p sphincterectomy, no complications  Cholecystectomy plan for 7/22   Antibiotics:  Cipro would be given pre-op for ERCP  Cefoxitin prophylaxis prior to cholecystectomy  HPI/Subjective: Feeling better; denies nausea, vomiting and abd pain. Tolerated CLD yesterday. NPO now for cholecystectomy  Objective: Filed Vitals:   12/22/12 1400 12/22/12 2106 12/23/12 0551 12/23/12 1042  BP: 109/77 117/69 133/78 145/88  Pulse:  60 57 65  Temp: 98.7 F (37.1 C) 99.1 F (37.3 C) 98.2 F (36.8 C) 98.5 F (36.9 C)  TempSrc: Oral Oral Oral Oral  Resp: 18 18 16 14   Height:      Weight:      SpO2: 99% 98% 96% 97%    Intake/Output Summary (Last 24 hours) at 12/23/12 1133 Last data filed at 12/23/12 1000  Gross per 24 hour  Intake   1495 ml  Output   2850 ml  Net  -1355 ml   Filed Weights   12/20/12 2147  Weight: 108.863 kg (240 lb)    Exam:   General:  Afebrile; NAD  Cardiovascular: S1 and S2, no rubs or  gallops  Respiratory: CTA bilaterally  Abdomen: soft, ND; no tenderness; no distension, positive BS  Musculoskeletal: no edema or cyanosis  Data Reviewed: Basic Metabolic Panel:  Recent Labs Lab 12/20/12 1657 12/22/12 0355 12/23/12 0347  NA 138 139 139  K 4.4 3.3* 3.3*  CL 100 106 104  CO2 24 25 27   GLUCOSE 137* 92 89  BUN 8 7 4*  CREATININE 0.49* 0.56 0.58  CALCIUM 10.0 8.7 9.1  MG  --   --  1.7   Liver Function Tests:  Recent Labs Lab 12/20/12 1657 12/22/12 0355 12/23/12 0347  AST 420* 82* 60*  ALT 492* 223* 176*  ALKPHOS 512* 289* 292*  BILITOT 2.8* 0.7 0.8  PROT 8.1 5.9* 6.1  ALBUMIN 4.0 2.9* 3.1*    Recent Labs Lab 12/20/12 1657 12/20/12 1910 12/22/12 0355 12/23/12 0347  LIPASE >3000*  --  111* 110*  AMYLASE  --  1276*  --   --    CBC:  Recent Labs Lab 12/20/12 1657  WBC 10.3  NEUTROABS 8.9*  HGB 12.6  HCT 38.2  MCV 88.2  PLT 252    Studies: Dg Ercp Biliary & Pancreatic Ducts  12/22/2012   *RADIOLOGY REPORT*  Clinical Data: Dilated common bile duct.  Pancreatitis.  ERCP  Comparison: None.  Findings: ERCP was performed.  Sphincterotomy with balloon sweep was performed.  No stones were retrieved.  There appear to be stones in the cystic duct.  On the last available image there are no stones in the common hepatic or common  bile duct or in the visualized intrahepatic ducts.  IMPRESSION: No common bile duct stones.  Stones present in the cystic duct.   Original Report Authenticated By: Francene Boyers, M.D.    Scheduled Meds: . cefOXitin  2 g Intravenous Once  . [MAR HOLD] heparin  5,000 Units Subcutaneous Q8H  . [MAR HOLD] multivitamin with minerals  1 tablet Oral Daily  . [MAR HOLD] pantoprazole (PROTONIX) IV  40 mg Intravenous Q24H   Continuous Infusions: . sodium chloride 50 mL/hr at 12/22/12 1918  . lactated ringers 1,000 mL (12/23/12 1108)    Principal Problem:   Gallstone pancreatitis Active Problems:   Overweight(278.02)    Cholelithiasis   Carcinoid tumor of appendix, malignant    Time spent: >30 minutes    Chong January  [facility] Pager [phone]. If 7PM-7AM, please contact night-coverage at [email or web address], password [facility] 12/23/2012, 11:33 AM  LOS: 3 days

## 2012-12-24 ENCOUNTER — Encounter (HOSPITAL_COMMUNITY): Payer: Self-pay | Admitting: Surgery

## 2012-12-24 LAB — CBC
HCT: 30.2 % — ABNORMAL LOW (ref 36.0–46.0)
Hemoglobin: 9.8 g/dL — ABNORMAL LOW (ref 12.0–15.0)
RDW: 14 % (ref 11.5–15.5)
WBC: 5.5 10*3/uL (ref 4.0–10.5)

## 2012-12-24 LAB — COMPREHENSIVE METABOLIC PANEL
BUN: 5 mg/dL — ABNORMAL LOW (ref 6–23)
CO2: 28 mEq/L (ref 19–32)
Calcium: 9 mg/dL (ref 8.4–10.5)
Creatinine, Ser: 0.51 mg/dL (ref 0.50–1.10)
GFR calc Af Amer: >90 mL/min (ref >90)
GFR calc non Af Amer: >90 mL/min (ref >90)
Glucose, Bld: 121 mg/dL — ABNORMAL HIGH (ref 70–99)
Total Protein: 6.3 g/dL (ref 6.0–8.3)

## 2012-12-24 LAB — LIPASE, BLOOD: Lipase: 78 U/L — ABNORMAL HIGH (ref 11–59)

## 2012-12-24 MED ORDER — OXYCODONE-ACETAMINOPHEN 5-325 MG PO TABS: 1.0000 | ORAL_TABLET | ORAL | Status: DC | PRN

## 2012-12-24 MED ORDER — ONDANSETRON HCL 4 MG PO TABS: 4.0000 mg | ORAL_TABLET | Freq: Three times a day (TID) | ORAL | Status: DC | PRN

## 2012-12-24 NOTE — Progress Notes (Signed)
Patient care transfer to [facility]. Please call with questions as needed.  Durinda Buzzelli, Md.

## 2012-12-24 NOTE — Discharge Summary (Signed)
Physician Discharge Summary  Patient ID: [name] [name] MRN: [id number] DOB/AGE: Oct 24, 1980 31 y.o.  Admit date: 12/20/2012 Discharge date: 12/24/2012  Admitting Diagnosis: Gallstone pancreatitis   Discharge Diagnosis Same  Consultants None  Procedures Laparoscopic Cholecystectomy with [facility]  Hospital Course 32 y.o. (DOB: 1980/12/10) [name] [name] female whose primary care physician is TODD,JEFFREY ALLEN, MD and comes to [facility] for abdominal pain. She has had symptomatic gallstones for some time. She was seen by Dr. Clovis Pu on 09/24/2012 for symptomatic gallstones. Planned surgery was delayed until after her delivery. She had a C section 11/03/2012 by Dr. Ronnell Freshwater. She has a healthy girl named Ava. Post C section, she developed an ileus and had a serosal tear of her right colon. This required a right colectomy by Dr. Carolynne Edouard on 11/08/2012. Her post op course was complicated by an ileus and open wound. She was discharged home around 11/18/2012. Her wound has done well. But she has been "sick" 5 times since discharge. She got sick enough and vomited that her mother brought her to the [facility]. Her labs point to gallstone pancreatitis. An US shows biliary dilatation   Patient was admitted.  GI was consulted and an ERCP was done which was negative for retained stone.  Once her LFTs and lipase improved she underwent procedure listed above.  Tolerated procedure well and was transferred to the floor.  Diet was advanced as tolerated.  On POD#1, the patient was voiding well, tolerating diet, ambulating well, pain well controlled, vital signs stable, incisions c/d/i and felt stable for discharge home.  Patient will follow up in our office next week for staple removal and knows to call with questions or concerns.  Physcial Exam VSS afebrile General: alert, NAD Abd: relatively soft, +BS, incisions c/d/i with staples, midline wound superficial and shallow, min pain    Medication List         diphenhydrAMINE 25 mg  capsule  Commonly known as:  BENADRYL  Take 25 mg by mouth every 6 (six) hours as needed for itching.     ibuprofen 600 MG tablet  Commonly known as:  ADVIL,MOTRIN  Take 1 tablet (600 mg total) by mouth every 6 (six) hours as needed for pain.     omeprazole 40 MG capsule  Commonly known as:  PRILOSEC  Take 40 mg by mouth daily.     ondansetron 4 MG tablet  Commonly known as:  ZOFRAN  Take 1 tablet (4 mg total) by mouth every 8 (eight) hours as needed for nausea.     oxyCODONE-acetaminophen 5-325 MG per tablet  Commonly known as:  PERCOCET/ROXICET  Take 1-2 tablets by mouth every 4 (four) hours as needed.     promethazine 12.5 MG tablet  Commonly known as:  PHENERGAN  Take 1 tablet (12.5 mg total) by mouth every 6 (six) hours as needed for nausea.             Follow-up Information   Follow up with [facility] In 2 weeks. (2-3 weeks for post-op recheck)    Contact information:   [address]   [place] [address] [phone]       Signed: Denny Levy [facility] [phone]  12/24/2012, 6:53 AM

## 2012-12-24 NOTE — ED Provider Notes (Signed)
Medical screening examination/treatment/procedure(s) were conducted as a shared visit with non-physician practitioner(s) and myself.  I personally evaluated the patient during the encounter.   Please see my separate note.    Guida Asman David Kina Shiffman, MD 12/24/12 1042 

## 2012-12-24 NOTE — Care Management Note (Signed)
    Page 1 of 1   12/24/2012     9:11:00 AM   CARE MANAGEMENT NOTE 12/24/2012  Patient:  [name] [name], [name] [name]   Account Number:  [id number]  Date Initiated:  12/21/2012  Documentation initiated by:  [facility]  Subjective/Objective Assessment:   32 year old female admitted with gallstone pancreatitis.     Action/Plan:   From home.   Anticipated DC Date:  12/24/2012   Anticipated DC Plan:  HOME/SELF CARE      DC Planning Services  CM consult      Choice offered to / List presented to:             Status of service:  Completed, signed off Medicare Important Message given?  NA - LOS <3 / Initial given by admissions (If response is "NO", the following Medicare IM given date fields will be blank) Date Medicare IM given:   Date Additional Medicare IM given:    Discharge Disposition:  HOME/SELF CARE  Per UR Regulation:  Reviewed for med. necessity/level of care/duration of stay  If discussed at Long Length of Stay Meetings, dates discussed:    Comments:

## 2012-12-24 NOTE — Discharge Instructions (Signed)
[facility] ______[facility] LAPAROSCOPIC SURGERY: POST OP INSTRUCTIONS Always review your discharge instruction sheet given to you by the facility where your surgery was performed. IF YOU HAVE DISABILITY OR FAMILY LEAVE FORMS, YOU MUST BRING THEM TO THE OFFICE FOR PROCESSING.   DO NOT GIVE THEM TO YOUR DOCTOR.  1. A prescription for pain medication may be given to you upon discharge.  Take your pain medication as prescribed, if needed.  If narcotic pain medicine is not needed, then you may take acetaminophen (Tylenol) or ibuprofen (Advil) as needed. 2. Take your usually prescribed medications unless otherwise directed. 3. If you need a refill on your pain medication, please contact your pharmacy.  They will contact our office to request authorization. Prescriptions will not be filled after 5pm or on week-ends. 4. You should follow a light diet the first few days after arrival home, such as soup and crackers, etc.  Be sure to include lots of fluids daily. 5. Most patients will experience some swelling and bruising in the area of the incisions.  Ice packs will help.  Swelling and bruising can take several days to resolve.  6. It is common to experience some constipation if taking pain medication after surgery.  Increasing fluid intake and taking a stool softener (such as Colace) will usually help or prevent this problem from occurring.  A mild laxative (Milk of Magnesia or Miralax) should be taken according to package instructions if there are no bowel movements after 48 hours. 7. Unless discharge instructions indicate otherwise, you may remove your bandages 24-48 hours after surgery, and you may shower at that time.  You may have steri-strips (small skin tapes) in place directly over the incision.  These strips should be left on the skin for 7-10 days.  If your surgeon used skin glue on the incision, you may shower in 24 hours.  The glue will flake off over the next 2-3 weeks.  Any sutures or  staples will be removed at the office during your follow-up visit. 8. ACTIVITIES:  You may resume regular (light) daily activities beginning the next day--such as daily self-care, walking, climbing stairs--gradually increasing activities as tolerated.  You may have sexual intercourse when it is comfortable.  Refrain from any heavy lifting or straining until approved by your doctor. a. You may drive when you are no longer taking prescription pain medication, you can comfortably wear a seatbelt, and you can safely maneuver your car and apply brakes. b. RETURN TO WORK:  __________________________________________________________ 9. You should see your doctor in the office for a follow-up appointment approximately 2-3 weeks after your surgery.  Make sure that you call for this appointment within a day or two after you arrive home to insure a convenient appointment time. 10. OTHER INSTRUCTIONS: __________________________________________________________________________________________________________________________ __________________________________________________________________________________________________________________________ WHEN TO CALL YOUR DOCTOR: 1. Fever over 101.0 2. Inability to urinate 3. Continued bleeding from incision. 4. Increased pain, redness, or drainage from the incision. 5. Increasing abdominal pain  The clinic staff is available to answer your questions during regular business hours.  Please don't hesitate to call and ask to speak to one of the nurses for clinical concerns.  If you have a medical emergency, go to the nearest emergency room or call 911.  A surgeon from [facility] is always on call at the hospital. [address], [address], [place], [place]  [address] ? [address] [address], [place], [place]   [address] [phone] ? [phone] ? FAX [phone] Web site:  [email or web address]      Acute Pancreatitis Acute pancreatitis is a  disease in which the pancreas becomes suddenly inflamed. The pancreas is a large gland located behind your stomach. The pancreas produces enzymes that help digest food. The pancreas also releases the hormones glucagon and insulin that help regulate blood sugar. Damage to the pancreas occurs when the digestive enzymes from the pancreas are activated and begin attacking the pancreas before being released into the intestine. Most acute attacks last a couple of days and can cause serious complications. Some people become dehydrated and develop low blood pressure. In severe cases, bleeding into the pancreas can lead to shock and can be life-threatening. The lungs, heart, and kidneys may fail. CAUSES  Pancreatitis can happen to anyone. In some cases, the cause is unknown. Most cases are caused by:  Alcohol abuse.  Gallstones. Other less common causes are:  Certain medicines.  Exposure to certain chemicals.  Infection.  Damage caused by an accident (trauma).  Abdominal surgery. SYMPTOMS   Pain in the upper abdomen that may radiate to the back.  Tenderness and swelling of the abdomen.  Nausea and vomiting. DIAGNOSIS  Your caregiver will perform a physical exam. Blood and stool tests may be done to confirm the diagnosis. Imaging tests may also be done, such as X-rays, CT scans, or an ultrasound of the abdomen. TREATMENT  Treatment usually requires a stay in the hospital. Treatment may include:  Pain medicine.  Fluid replacement through an intravenous line (IV).  Placing a tube in the stomach to remove stomach contents and control vomiting.  Not eating for 3 or 4 days. This gives your pancreas a rest, because enzymes are not being produced that can cause further damage.  Antibiotic medicines if your condition is caused by an infection.  Surgery of the pancreas or gallbladder. HOME CARE INSTRUCTIONS   Follow the diet advised by your caregiver. This may involve avoiding alcohol and  decreasing the amount of fat in your diet.  Eat smaller, more frequent meals. This reduces the amount of digestive juices the pancreas produces.  Drink enough fluids to keep your urine clear or pale yellow.  Only take over-the-counter or prescription medicines as directed by your caregiver.  Avoid drinking alcohol if it caused your condition.  Do not smoke.  Get plenty of rest.  Check your blood sugar at home as directed by your caregiver.  Keep all follow-up appointments as directed by your caregiver. SEEK MEDICAL CARE IF:   You do not recover as quickly as expected.  You develop new or worsening symptoms.  You have persistent pain, weakness, or nausea.  You recover and then have another episode of pain. SEEK IMMEDIATE MEDICAL CARE IF:   You are unable to eat or keep fluids down.  Your pain becomes severe.  You have a fever or persistent symptoms for more than 2 to 3 days.  You have a fever and your symptoms suddenly get worse.  Your skin or the [place] Scheffler part of your eyes turn yellow (jaundice).  You develop vomiting.  You feel dizzy, or you faint.  Your blood sugar is high (over 300 mg/dL). MAKE SURE YOU:   Understand these instructions.  Will watch your condition.  Will get help right away if you are not doing well or get worse. Document Released: 05/21/2005 Document Revised: 11/20/2011 Document Reviewed: 08/30/2011 [facility] Patient Information 2014 [place], [place].   Laparoscopic Cholecystectomy Laparoscopic cholecystectomy is surgery to remove the gallbladder. The gallbladder is located slightly  to the right of center in the abdomen, behind the liver. It is a concentrating and storage sac for the bile produced in the liver. Bile aids in the digestion and absorption of fats. Gallbladder disease (cholecystitis) is an inflammation of your gallbladder. This condition is usually caused by a buildup of gallstones (cholelithiasis) in your gallbladder. Gallstones can  block the flow of bile, resulting in inflammation and pain. In severe cases, emergency surgery may be required. When emergency surgery is not required, you will have time to prepare for the procedure. Laparoscopic surgery is an alternative to open surgery. Laparoscopic surgery usually has a shorter recovery time. Your common bile duct may also need to be examined and explored. Your caregiver will discuss this with you if he or she feels this should be done. If stones are found in the common bile duct, they may be removed. LET YOUR CAREGIVER KNOW ABOUT:  Allergies to food or medicine.  Medicines taken, including vitamins, herbs, eyedrops, over-the-counter medicines, and creams.  Use of steroids (by mouth or creams).  Previous problems with anesthetics or numbing medicines.  History of bleeding problems or blood clots.  Previous surgery.  Other health problems, including diabetes and kidney problems.  Possibility of pregnancy, if this applies. RISKS AND COMPLICATIONS All surgery is associated with risks. Some problems that may occur following this procedure include:  Infection.  Damage to the common bile duct, nerves, arteries, veins, or other internal organs such as the stomach or intestines.  Bleeding.  A stone may remain in the common bile duct. BEFORE THE PROCEDURE  Do not take aspirin for 3 days prior to surgery or blood thinners for 1 week prior to surgery.  Do not eat or drink anything after midnight the night before surgery.  Let your caregiver know if you develop a cold or other infectious problem prior to surgery.  You should be present 60 minutes before the procedure or as directed. PROCEDURE  You will be given medicine that makes you sleep (general anesthetic). When you are asleep, your surgeon will make several small cuts (incisions) in your abdomen. One of these incisions is used to insert a small, lighted scope (laparoscope) into the abdomen. The laparoscope helps  the surgeon see into your abdomen. Carbon dioxide gas will be pumped into your abdomen. The gas allows more room for the surgeon to perform your surgery. Other operating instruments are inserted through the other incisions. Laparoscopic procedures may not be appropriate when:  There is major scarring from previous surgery.  The gallbladder is extremely inflamed.  There are bleeding disorders or unexpected cirrhosis of the liver.  A pregnancy is near term.  Other conditions make the laparoscopic procedure impossible. If your surgeon feels it is not safe to continue with a laparoscopic procedure, he or she will perform an open abdominal procedure. In this case, the surgeon will make an incision to open the abdomen. This gives the surgeon a larger view and field to work within. This may allow the surgeon to perform procedures that sometimes cannot be performed with a laparoscope alone. Open surgery has a longer recovery time. AFTER THE PROCEDURE  You will be taken to the recovery area where a nurse will watch and check your progress.  You may be allowed to go home the same day.  Do not resume physical activities until directed by your caregiver.  You may resume a normal diet and activities as directed. Document Released: 05/21/2005 Document Revised: 08/13/2011 Document Reviewed: 11/03/2010 [employer] Patient  Information 2014 [place], [place].

## 2012-12-30 ENCOUNTER — Encounter (INDEPENDENT_AMBULATORY_CARE_PROVIDER_SITE_OTHER): Payer: Self-pay | Admitting: General Surgery

## 2012-12-30 ENCOUNTER — Ambulatory Visit (INDEPENDENT_AMBULATORY_CARE_PROVIDER_SITE_OTHER): Payer: Medicaid Other | Admitting: General Surgery

## 2012-12-30 VITALS — BP 110/74 | HR 80 | Resp 14 | Ht 65.0 in | Wt 230.4 lb

## 2012-12-30 DIAGNOSIS — C7A020 Malignant carcinoid tumor of the appendix: Secondary | ICD-10-CM

## 2012-12-30 DIAGNOSIS — K8020 Calculus of gallbladder without cholecystitis without obstruction: Secondary | ICD-10-CM

## 2012-12-30 NOTE — Patient Instructions (Signed)
Re-schedule follow-up with oncology.

## 2012-12-30 NOTE — Progress Notes (Signed)
Subjective:     Patient ID: [name] [name], female   DOB: 12/23/1980, 32 y.o.   MRN: [id number]  HPI The patient is about 7 weeks status post right colectomy for a perforation after C-section that incidentally found a carcinoid tumor of her appendix as well as one-week status post laparoscopic cholecystectomy for gallstone pancreatitis. She is feeling much better. She denies any abdominal pain. Her appetite is good and her bowels are working normally.  Review of Systems     Objective:   Physical Exam On exam her midline incision is completely healed at this point. Her laparoscopic incisions are healing nicely with no sign of infection. Her abdomen is soft and nontender. Her staples were removed and she tolerated this well.    Assessment:     The patient is 7 weeks status post right colectomy in 1 week status post laparoscopic cholecystectomy     Plan:     At this point I think she can gradually begin returning to her normal activities. We will plan to see her back in about 6 months to check her progress. She will reschedule her appointment with medical oncology to talk about any further treatment for her carcinoid tumor.

## 2013-01-06 ENCOUNTER — Telehealth: Payer: Self-pay | Admitting: Dietician

## 2013-01-12 ENCOUNTER — Encounter: Payer: Self-pay | Admitting: Oncology

## 2013-01-12 ENCOUNTER — Telehealth: Payer: Self-pay | Admitting: Oncology

## 2013-01-12 ENCOUNTER — Ambulatory Visit (HOSPITAL_BASED_OUTPATIENT_CLINIC_OR_DEPARTMENT_OTHER): Payer: Medicaid Other | Admitting: Oncology

## 2013-01-12 ENCOUNTER — Ambulatory Visit: Payer: 59

## 2013-01-12 VITALS — BP 109/64 | HR 80 | Temp 98.4°F | Resp 20 | Ht 65.0 in | Wt 235.5 lb

## 2013-01-12 DIAGNOSIS — C7A020 Malignant carcinoid tumor of the appendix: Secondary | ICD-10-CM

## 2013-01-12 NOTE — Progress Notes (Signed)
Presents with her mother, [name] [name] for new patient appointment with new diagnosis of carcinoid. Completed Patient Measure of Distress Tool and rates overall distress 1/10. Some anxiety noted and reports of constipation. She has a newborn 43 1/2 month old infant as well as two other school aged children. Lives with boyfriend who is the father of all three children. Reports incision has healed except for small open area (no drainage).

## 2013-01-12 NOTE — Telephone Encounter (Signed)
gv and printed appt sched and avs for pt  °

## 2013-01-12 NOTE — Progress Notes (Signed)
Met with patient and her mother.  Explained role of nurse navigator.  Patient was provided with written educational information on Carcinoid tumors and was provided with contact information for [facility] and Dr. Kalman Drape team.  Per Dr. Truett Perna, no treatment recommended and patient will f/u in 1 year. She verbalized understanding and acknowledged she would call if problems develop.

## 2013-01-12 NOTE — Progress Notes (Signed)
[facility] New Patient Consult   Referring MD: [name] [name] 32 y.o.  12-Feb-1981    Reason for Referral: Carcinoid tumor     HPI: She underwent a cesarean section on 11/03/2012. She developed severe abdominal pain on the morning of 11/08/2012 and was noted to have free air on an abdominal x-ray. Dr. Carolynne [name] was consulted and she was taken the operating room on 11/08/2012. Pus was identified in the abdominal cavity. A tear was identified at the anterior wall of the cecum. A right colectomy was performed.  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 Tumor was noted to involve the serosa. The proximal and distal resection margins were negative. Lymphovascular and perineural invasion were identified. No evidence of tumor in 18 lymph nodes. The wall of the appendix appeared intact.  She was readmitted on 12/20/2012 with abdominal pain. A clinical diagnosis of gallstone pancreatitis was made. An ERCP was negative for a retained stone. She was taken to a laparoscopic cholecystectomy on 12/23/2012.  She reports resolution of abdominal pain following this procedure. She now feels well. She felt well prior to the cesarean section.    Past Medical History  Diagnosis Date  . Gallstones   . Cholecystitis chronic, acute   . Heart murmur     no indications for 7-26yrs per pt.  . Asthma     childhood exercise induced only  . Headache(784.0)     migraines   .    G3 P3  Past Surgical History  Procedure Laterality Date  . Cesarean section N/A 11/03/2012    Procedure: PRIMARY CESAREAN SECTION;  Surgeon: [name] Plume, MD;  Location: [facility];  Service: Obstetrics;  Laterality: N/A;  . Laparotomy N/A 11/08/2012    Procedure: laparoscopic exploration and EXPLORATORY LAPAROTOMY;  Surgeon: [name] Askew, MD;  Location: [facility];  Service: General;  Laterality: N/A;  . Appendectomy    . Ercp N/A 12/22/2012   Procedure: ENDOSCOPIC RETROGRADE CHOLANGIOPANCREATOGRAPHY (ERCP);  Surgeon: [name] Boop, MD;  Location: [facility];  Service: Endoscopy;  Laterality: N/A;  . Cholecystectomy N/A 12/23/2012    Procedure: LAPAROSCOPIC CHOLECYSTECTOMY WITH INTRAOPERATIVE CHOLANGIOGRAM;  Surgeon: [name] Merino, MD;  Location: [facility];  Service: General;  Laterality: N/A;  Laparoscopic Cholecystectomy with IOC    Family History  Problem Relation Age of Onset  . ADD / ADHD      family hx  . Breast cancer      fhx  . Mental illness      fhx  . Heart disease      fhx  . Hypertension      fhx   paternal grandfather had "skin cancer ". No other family history of cancer  No current outpatient prescriptions on file.  Allergies: No Known Allergies  Social History: She lives in [place]. She is a Futures trader. No tobacco or alcohol use. No risk factor for HIV or hepatitis. No transfusion history.   ROS:   Positives include: Weight loss during pregnancy, nausea during pregnancy, migraine headaches surrounding the menstrual cycle for the past 45 years  A complete ROS was otherwise negative.  Physical Exam:  Blood pressure 109/64, pulse 80, temperature 98.4 F (36.9 C), temperature source Oral, resp. rate 20, height 5\' 5"  (1.651 m), weight 235 lb 8 oz (106.822 kg).  HEENT: Oropharynx without visible mass, neck without mass Lungs: Clear bilaterally Cardiac: Regular rate and rhythm Abdomen: No hepatosplenomegaly,  nontender, no mass, no apparent ascites, healing midline incision  Vascular: No leg edema Lymph nodes: No cervical, supraclavicular, axillary, or inguinal nodes Neurologic: Alert and oriented, the motor exam appears intact in the upper and lower extremities Skin: No rash, multiple tattoos Musculoskeletal: No spine tenderness    Assessment/Plan:   1. 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 Right colectomy  11/10/2012 secondary to a bowel perforation at the cecum  3. Cesarean section 11/03/2012  4. G3 P3  5. Pancreatitis,? Gallstone-related-status post a cholecystectomy 12/23/2012   Disposition:   [name] [name] was diagnosed with a carcinoid tumor of the appendix when she underwent a right colectomy on 11/10/2012 for management of a bowel perforation. She reports no symptoms to suggest a diagnosis of "carcinoid syndrome ". She appeared asymptomatic from the carcinoid tumor prior to surgery.  There is no indication for adjuvant therapy. She has a good prognosis for a long-term disease-free survival with regard to the low-grade carcinoid tumor. I do not recommend further imaging or laboratory studies.   We discussed the symptoms of a recurrent carcinoid tumor. She will contact us for new symptoms. She will return for an office visit in one year. I will present her case at the GI tumor conference within the next few weeks.  [name] [name] 01/12/2013, 4:44 PM

## 2013-01-12 NOTE — Progress Notes (Signed)
Checked in new patient with no financial issues.  email for communication.  °

## 2013-03-27 NOTE — Progress Notes (Signed)
No phone call 

## 2013-05-20 ENCOUNTER — Encounter (INDEPENDENT_AMBULATORY_CARE_PROVIDER_SITE_OTHER): Payer: Self-pay | Admitting: General Surgery

## 2013-08-20 ENCOUNTER — Ambulatory Visit (INDEPENDENT_AMBULATORY_CARE_PROVIDER_SITE_OTHER): Payer: Medicaid Other | Admitting: Family Medicine

## 2013-08-20 ENCOUNTER — Encounter: Payer: Self-pay | Admitting: Family Medicine

## 2013-08-20 VITALS — BP 110/70 | Temp 98.8°F | Wt 268.0 lb

## 2013-08-20 DIAGNOSIS — E663 Overweight: Secondary | ICD-10-CM

## 2013-08-20 DIAGNOSIS — F39 Unspecified mood [affective] disorder: Secondary | ICD-10-CM

## 2013-08-20 MED ORDER — CITALOPRAM HYDROBROMIDE 20 MG PO TABS: 20.0000 mg | ORAL_TABLET | Freq: Every day | ORAL | Status: DC

## 2013-08-20 NOTE — Patient Instructions (Signed)
Restart the Celexa one daily at bedtime  For the tendinitis of your ankle elevation ice Motrin 600 mg twice daily with food  Begin to work hard on a diet exercise and weight loss

## 2013-08-20 NOTE — Progress Notes (Signed)
   Subjective:    Patient ID: [name] [name], female    DOB: 01-Mar-1981, 33 y.o.   MRN: [id number]  HPI [name] [name] is a 33 year old female who comes in today for evaluation of 3 issues  Since she had her baby she's been off her Celexa now for about 16 months. She's noticed mood swings weight gain with stress eating weight up to 268 pounds.  She's also developed some atraumatic tendinitis her right ankle.  Again weight 268 pounds for birth control using condoms.  Also had an appendectomy which cannot time she was found to have a carcinoid syndrome tumor the appendix    Review of Systems    review of systems otherwise negative Objective:   Physical Exam  Well-developed well-nourished female no acute distress vital signs stable she is afebrile BP normal 110/70     Assessment & Plan:  History of depression.......Marland Kitchen restart Celexa  Overweight........... diet and exercise............  Tendinitis right ankle..........Marland Kitchen elevation ice Motrin.

## 2013-11-14 ENCOUNTER — Telehealth: Payer: Self-pay | Admitting: Oncology

## 2013-11-14 NOTE — Telephone Encounter (Signed)
PAL - moved 8/10 appt to 8/3 w/LT. lmonvm for pt and mailed schedule.

## 2014-01-04 ENCOUNTER — Telehealth: Payer: Self-pay | Admitting: Oncology

## 2014-01-04 ENCOUNTER — Ambulatory Visit (HOSPITAL_BASED_OUTPATIENT_CLINIC_OR_DEPARTMENT_OTHER): Payer: Medicaid Other | Admitting: Nurse Practitioner

## 2014-01-04 VITALS — BP 108/76 | HR 68 | Temp 97.9°F | Resp 20 | Ht 65.0 in | Wt 280.2 lb

## 2014-01-04 DIAGNOSIS — D3A020 Benign carcinoid tumor of the appendix: Secondary | ICD-10-CM

## 2014-01-04 DIAGNOSIS — C7A020 Malignant carcinoid tumor of the appendix: Secondary | ICD-10-CM

## 2014-01-04 NOTE — Telephone Encounter (Signed)
gv pt appt schedule for aug 2016

## 2014-01-04 NOTE — Progress Notes (Signed)
  [facility] OFFICE PROGRESS NOTE   Diagnosis:  Carcinoid tumor.  INTERVAL HISTORY:   [name] [name] returns as scheduled. She has intermittent abdominal pain estimating at least 5 days per week. She attributes the pain to "scar tissue". She has the pain when she is more active. The pain resolves with rest. She has had loose stools since the gallbladder surgery. She has a bowel movement 20-30 minutes after a meal. She denies any flushing. No fevers or sweats.  She reports her father was recently diagnosed with acute myeloid leukemia.  Objective:  Vital signs in last 24 hours:  There were no vitals taken for this visit.    HEENT: No thrush or ulcerations. Lymphatics: No palpable cervical, supraclavicular, axillary or inguinal lymph nodes. Resp: Lungs are clear. Cardio: Regular cardiac rhythm. GI: Abdomen soft and nontender. No mass. No organomegaly. Vascular: No leg edema.  Lab Results:  Lab Results  Component Value Date   WBC 5.5 12/24/2012   HGB 9.8* 12/24/2012   HCT 30.2* 12/24/2012   MCV 88.0 12/24/2012   PLT 191 12/24/2012   NEUTROABS 8.9* 12/20/2012    Imaging:  No results found.  Medications: I have reviewed the patient's current medications.  Assessment/Plan: 1. 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  2. Right colectomy 11/10/2012 secondary to a bowel perforation at the cecum.  3. Cesarean section 11/03/2012.  4. G3 P3.  5. Pancreatitis,? Gallstone-related-status post a cholecystectomy 12/23/2012.    Disposition: [name] [name] appears stable. She remains in clinical remission from the carcinoid tumor. We will see her back in one year. She will contact the office in the interim with any problems, questions or concerns.  Plan reviewed with Dr. Benay Spice.    Ned Card ANP/GNP-BC   01/04/2014  2:43 PM

## 2014-01-11 ENCOUNTER — Ambulatory Visit: Payer: Medicaid Other | Admitting: Oncology

## 2014-04-05 ENCOUNTER — Encounter: Payer: Self-pay | Admitting: Family Medicine

## 2014-08-10 ENCOUNTER — Telehealth: Payer: Self-pay | Admitting: Family Medicine

## 2014-08-10 NOTE — Telephone Encounter (Signed)
Pt would like to know if Dr Sherren Mocha can see her sooner for her physical  because she does not have any birth control.

## 2014-08-10 NOTE — Telephone Encounter (Signed)
Please call patient to see if it is the birth control Dr Sherren Mocha prescribed.  If it is we can refill it until she is seen by Dr Sherren Mocha. thanks

## 2014-08-12 NOTE — Telephone Encounter (Signed)
lmovm to call back.

## 2014-09-10 IMAGING — CR DG ABDOMEN 2V
4 series · 4 of 4 positions shown · non-contrast
Comparison: None.

CLINICAL DATA: Right-sided abdominal pain.  2 days postop from C-
section.

ABDOMEN - 2 VIEW

[view not recorded (1 of 4)]
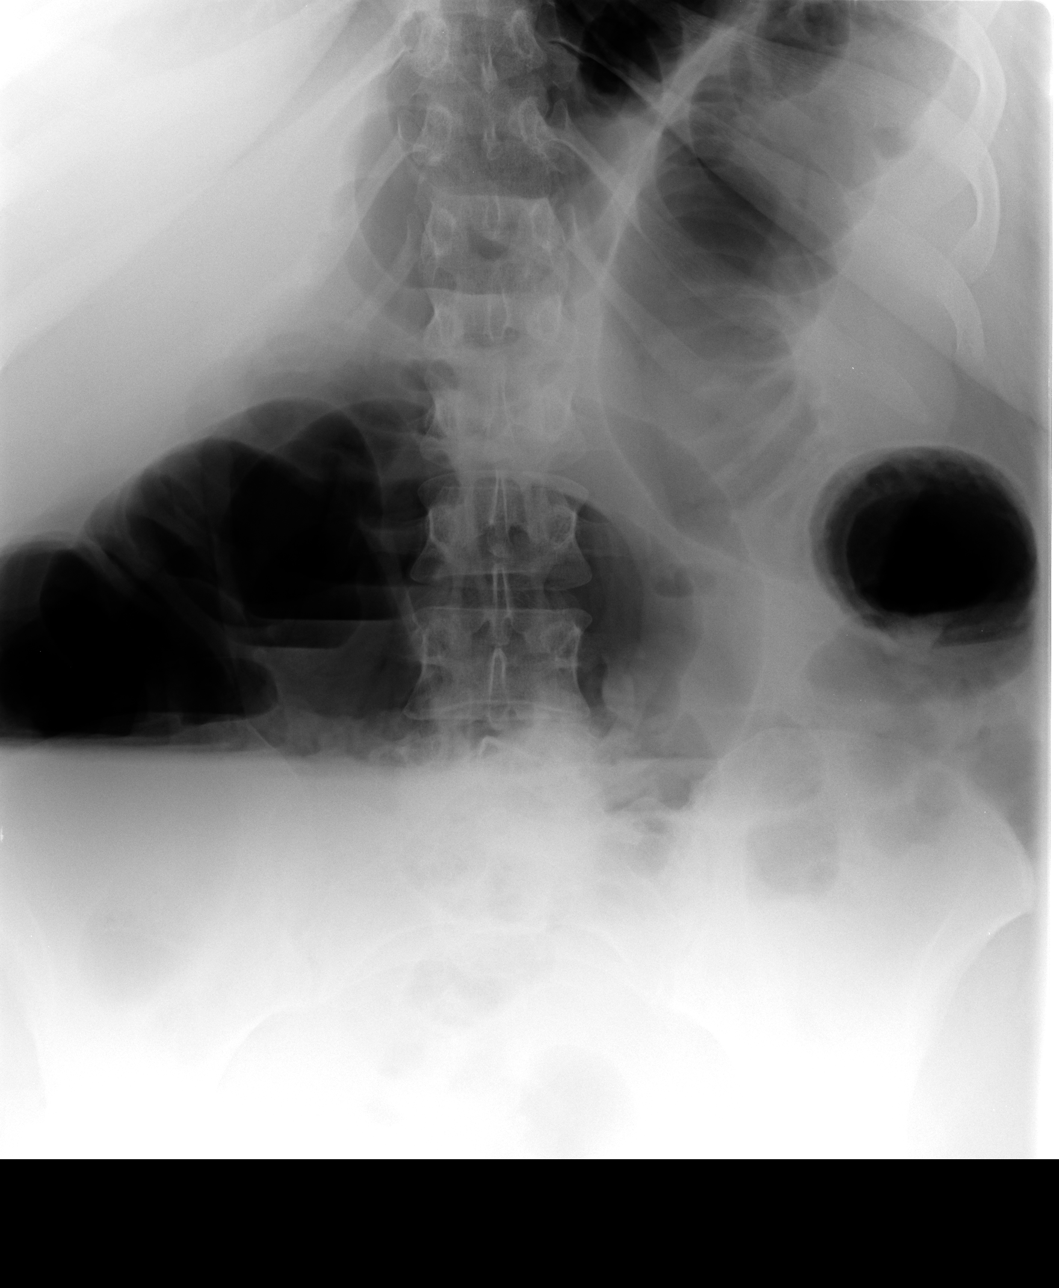

[view not recorded (2 of 4)]
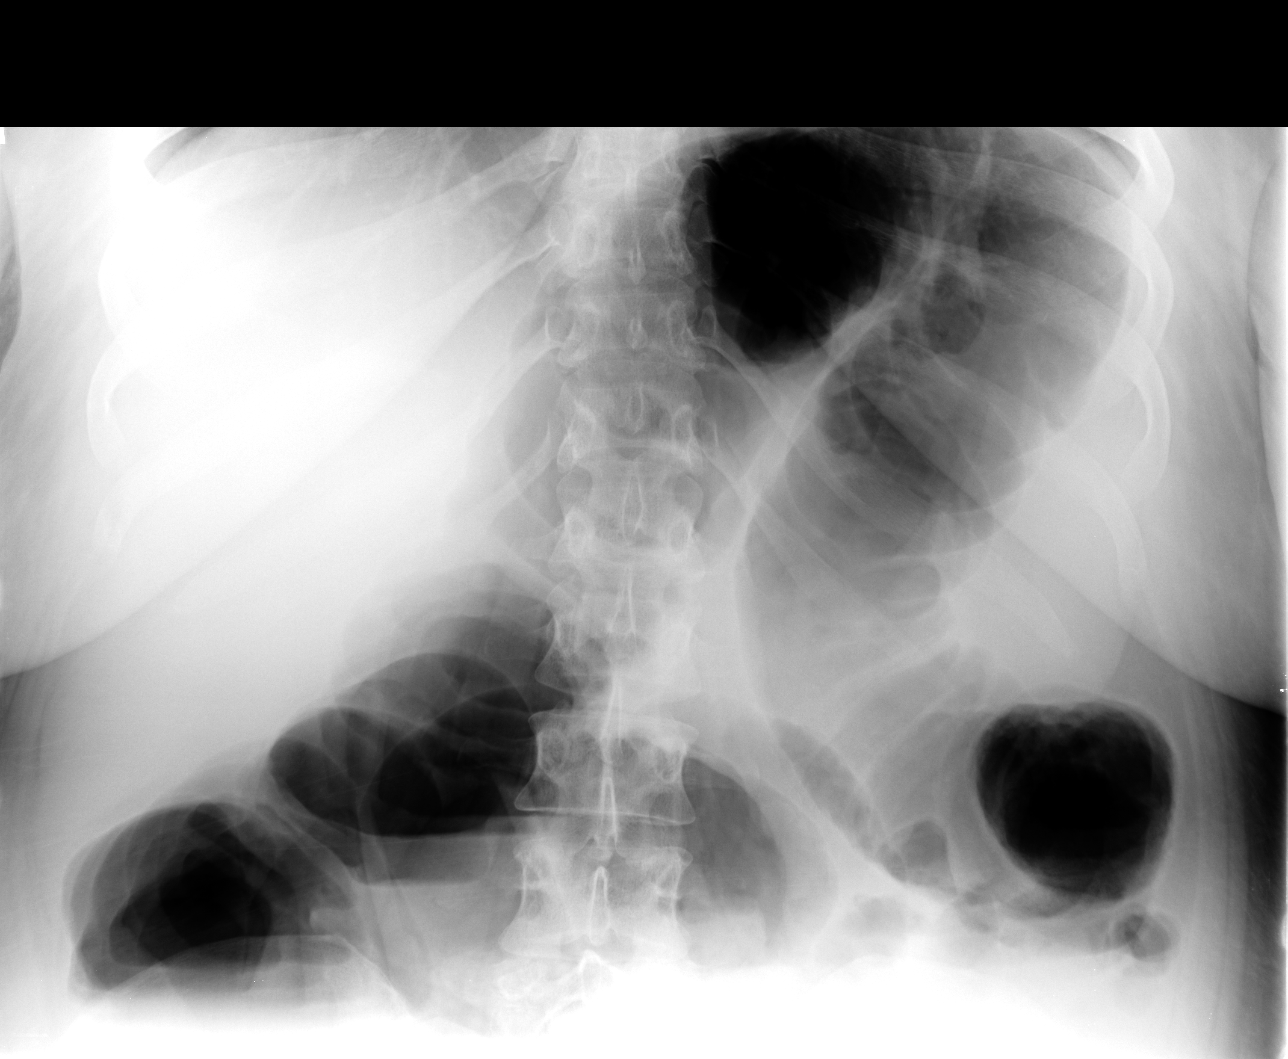

[view not recorded (3 of 4)]
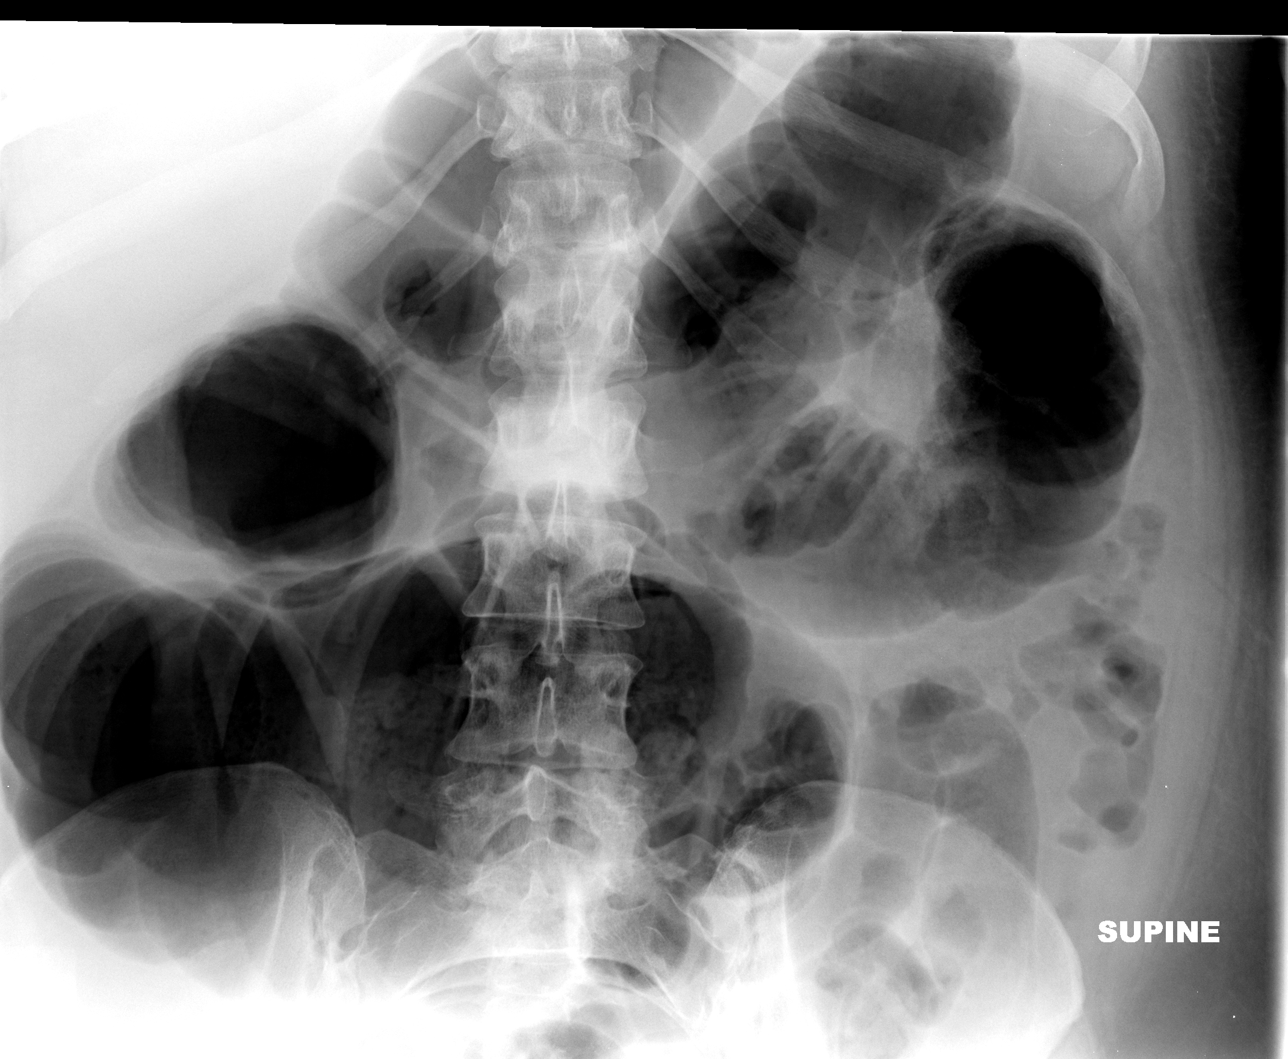

[view not recorded (4 of 4)]
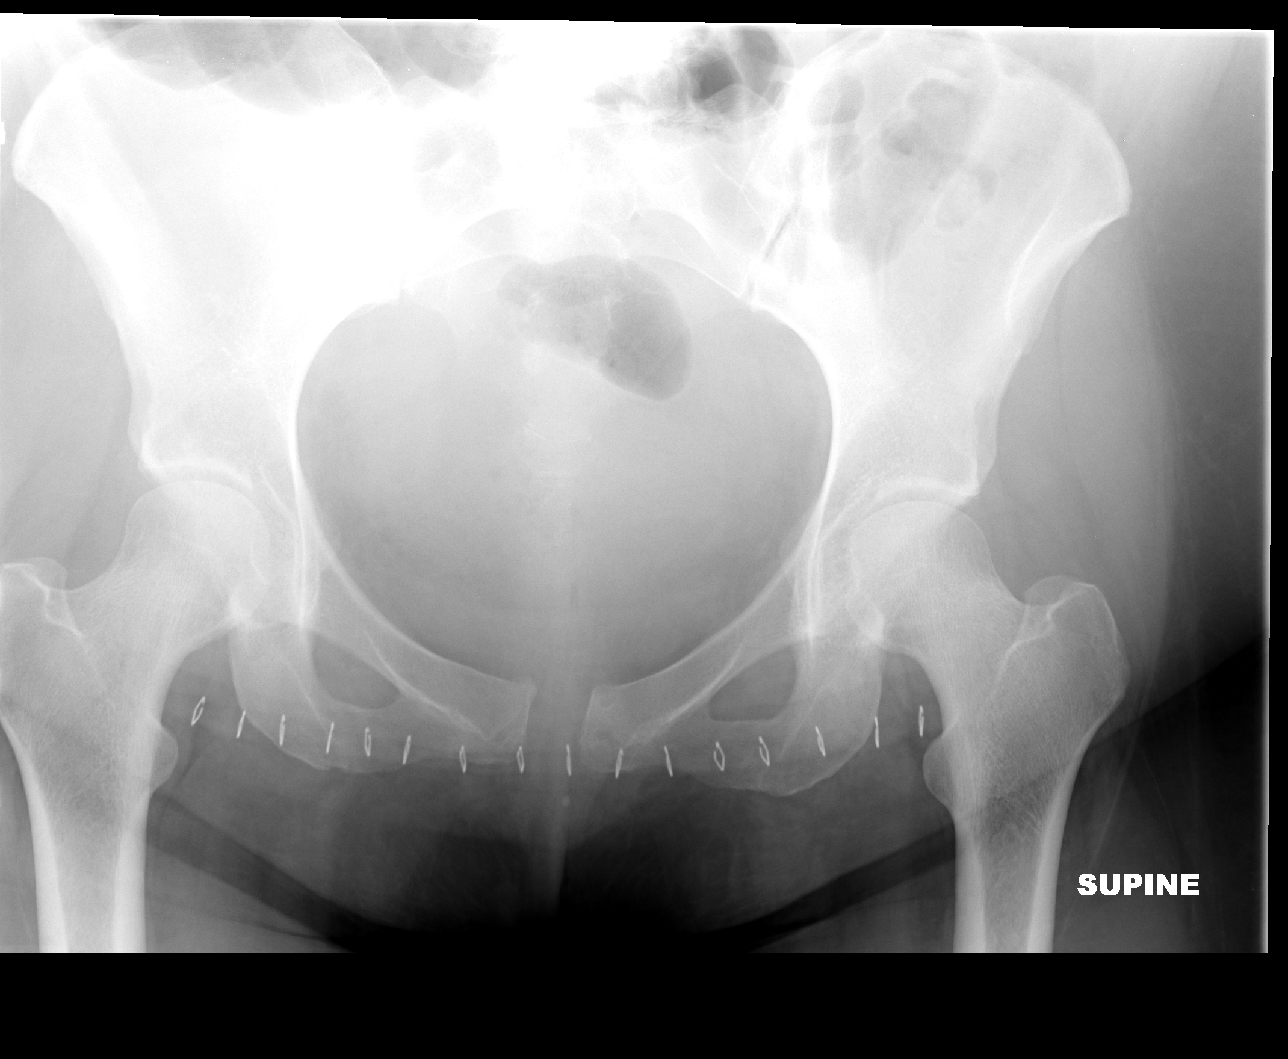

[4 of 4 positions shown; findings below may reference images not displayed]

FINDINGS: Lower transverse incision skin staples seen in the
suprapubic region.  Mild pubic symphysis dye stasis noted.

There is diffuse colonic dilatation with multiple air-fluid levels,
consistent with colonic ileus.  No evidence of dilated small bowel
loops.  No evidence of free air.  No radiopaque calculi identified.
IMPRESSION: 1.  Colonic ileus pattern.
2.  Mild postpartum pubic symphysis diastasis noted.

## 2014-09-13 IMAGING — CR DG ABDOMEN 2V
3 series · 3 of 3 positions shown · non-contrast
Comparison: 

CLINICAL DATA: Postop C-section on 11/03/2012.  Abdominal pain.
Reevaluate ileus.  The

ABDOMEN - 2 VIEW

[view not recorded (1 of 3)]
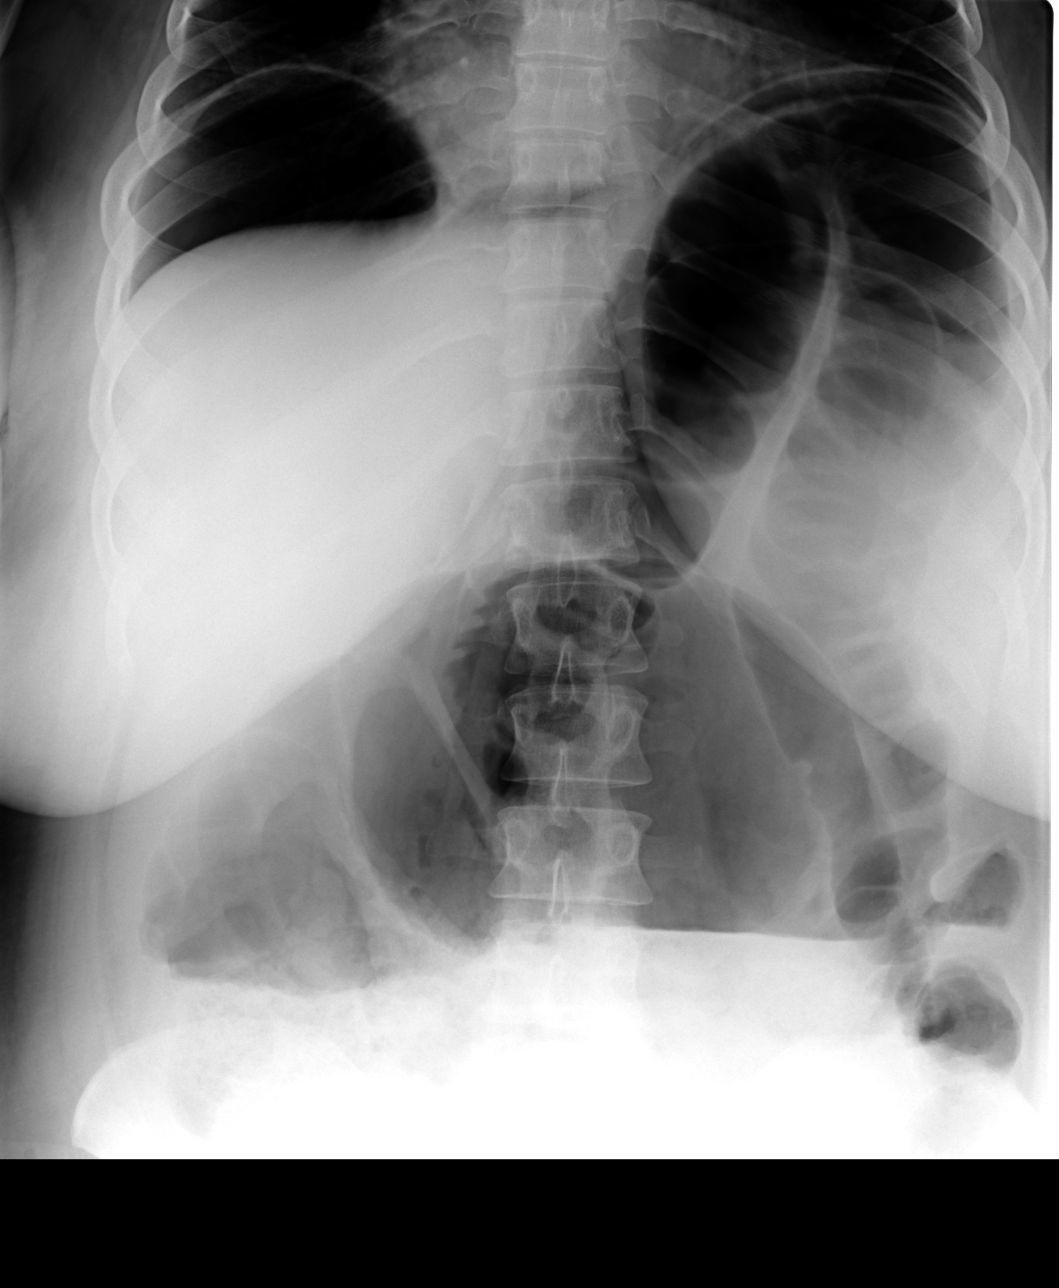

[view not recorded (2 of 3)]
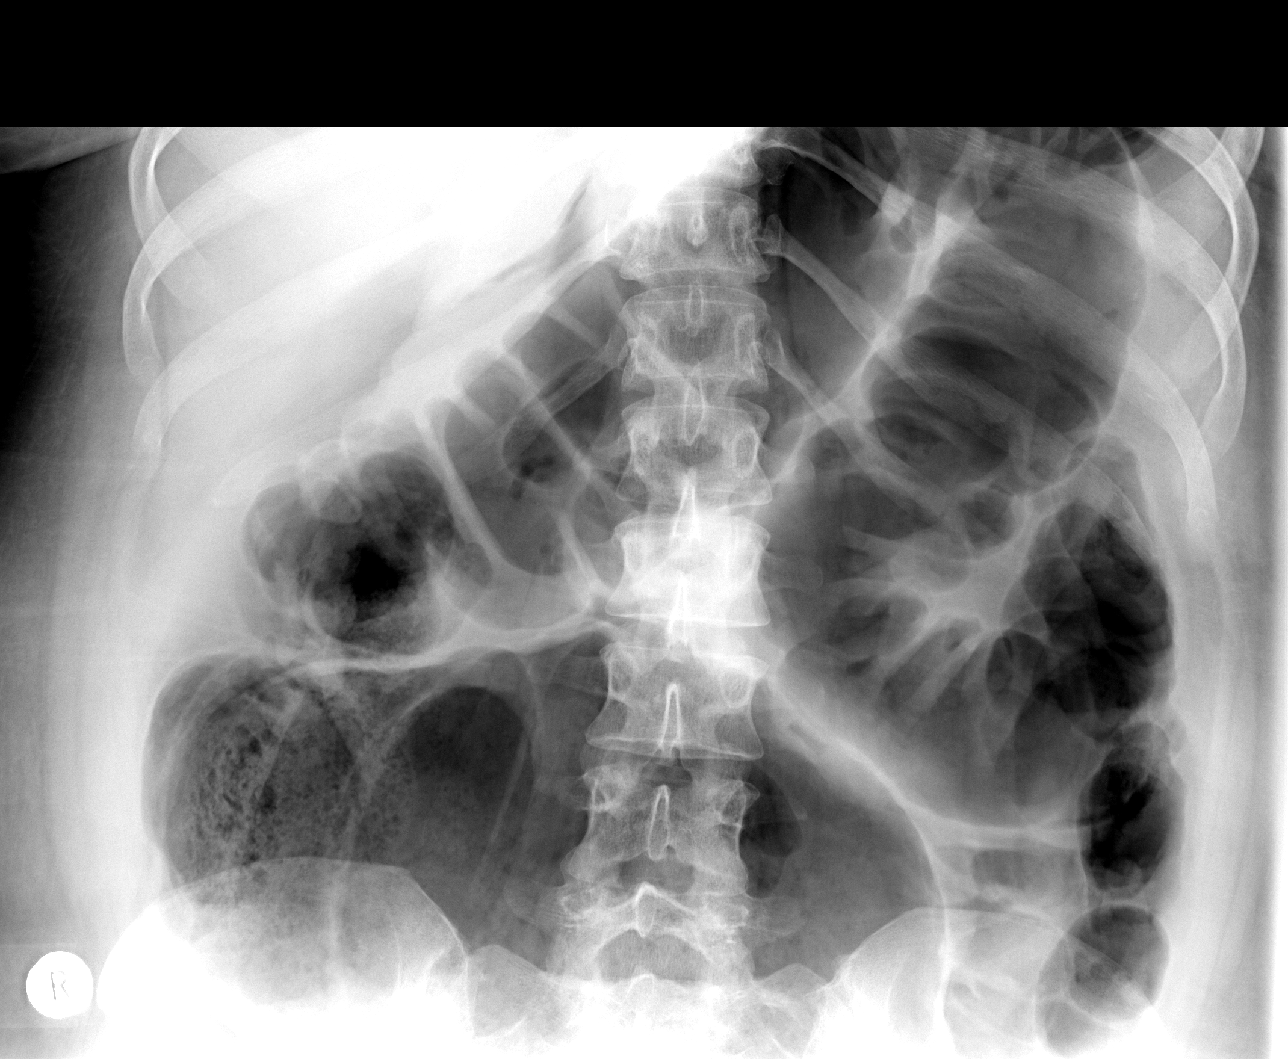

[view not recorded (3 of 3)]
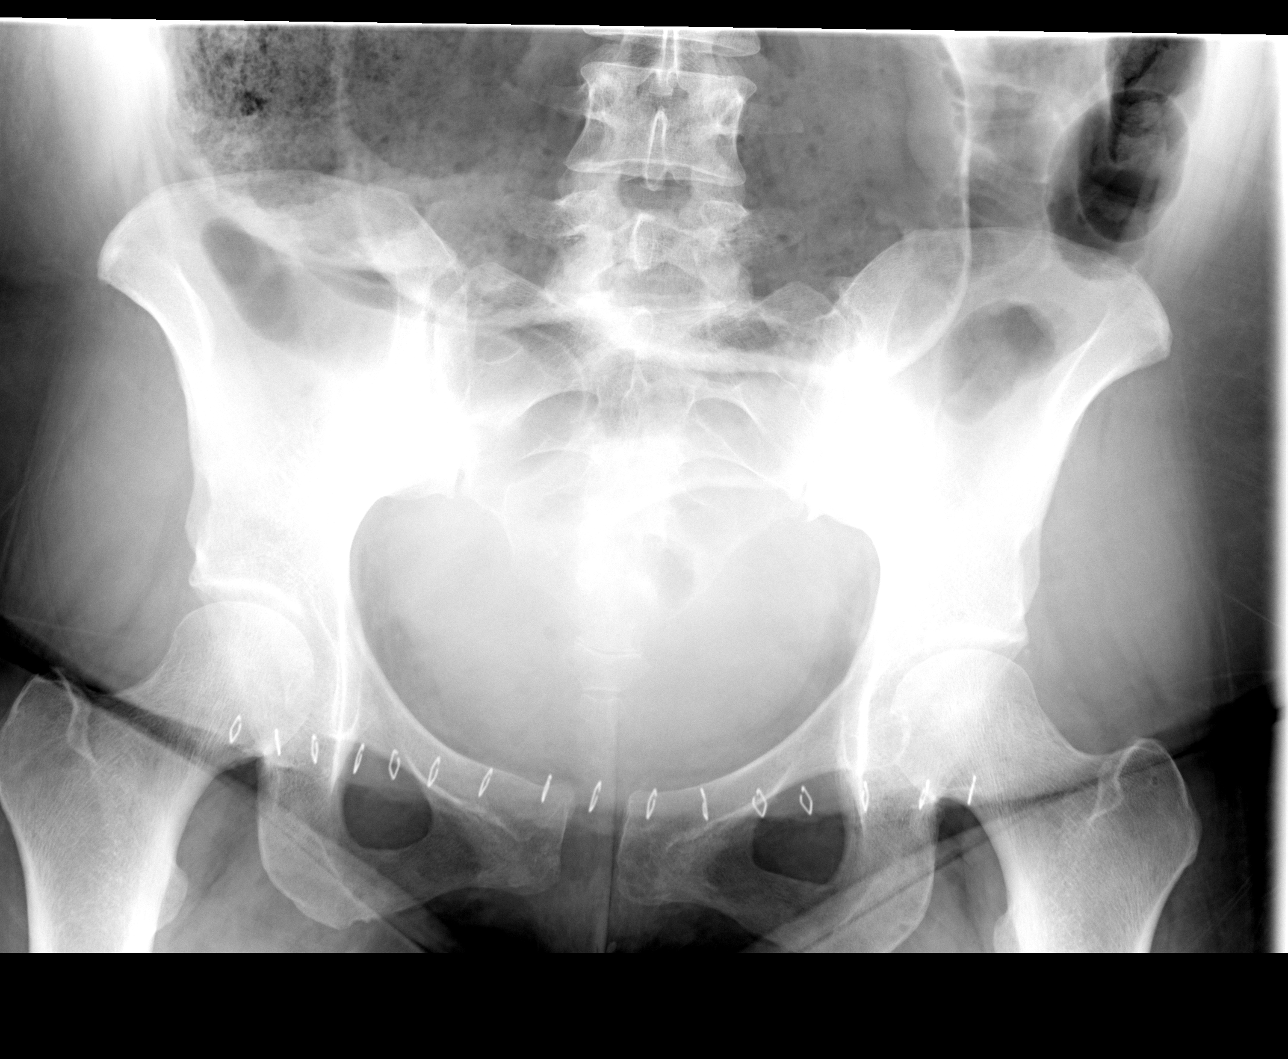

[3 of 3 positions shown; findings below may reference images not displayed]

FINDINGS: There is marked gaseous distension of colonic loops,
increased since previous exam.  Beneath the hemidiaphragms, there
is lucency, consistent with free intraperitoneal air.  The free air
is a new finding since prior studies and not felt to be related to
postoperative changes.  Surgical clips overlie the lower abdomen.
IMPRESSION: 1.  Increased gaseous distension of the colon.
2.  Evidence for bowel perforation.

Critical test results telephoned to Seninho Cluzeau at the time of
interpretation on date 11/08/2012 at time [DATE] a.m..

## 2014-10-07 ENCOUNTER — Encounter: Payer: Self-pay | Admitting: Adult Health

## 2014-10-07 ENCOUNTER — Ambulatory Visit (INDEPENDENT_AMBULATORY_CARE_PROVIDER_SITE_OTHER): Payer: Medicaid Other | Admitting: Adult Health

## 2014-10-07 VITALS — BP 102/80 | Temp 98.4°F | Ht 65.0 in | Wt 290.3 lb

## 2014-10-07 DIAGNOSIS — H00016 Hordeolum externum left eye, unspecified eyelid: Secondary | ICD-10-CM

## 2014-10-07 NOTE — Patient Instructions (Addendum)
Continue to use warm compresses and leave on for 15 minutes at a time. Alternate tylenol and motrin for pain relief. You can also try a warm green tea bag on the eye. Look on your [place] Access card to find out which eye doctor you can see.

## 2014-10-07 NOTE — Progress Notes (Signed)
   Subjective:    Patient ID: [name] [name], female    DOB: 1981/02/02, 34 y.o.   MRN: [id number]  HPI  Patient with Hordeolum to left upper eye lid. She has had this stye for approx one week. The redness, swelling and pain have been becoming worse since Monday. She has been trying OTC Stye medication as well as warm compresses.       Review of Systems  Eyes: Positive for pain and redness (to upper left eye lid). Negative for photophobia, discharge, itching and visual disturbance.  All other systems reviewed and are negative.      Objective:   Physical Exam  Constitutional: She is oriented to person, place, and time. She appears well-developed and well-nourished.  HENT:  Small red, painful lesion to upper left eye lid.   Neurological: She is alert and oriented to person, place, and time.  Skin: Skin is warm and dry.  Psychiatric: She has a normal mood and affect. Her behavior is normal. Judgment and thought content normal.       Assessment & Plan:  1. Hordeolum externum (stye), left - Continue to apply warm compresses to the left eye - May use warm tea bag on left eye to reduce inflammation  - Alternate tylenol and motrin for pain control.  - Follow up with eye doctor on your list.

## 2014-10-07 NOTE — Progress Notes (Signed)
Pre visit review using our clinic review tool, if applicable. No additional management support is needed unless otherwise documented below in the visit note. 

## 2014-10-14 ENCOUNTER — Encounter: Payer: Medicaid Other | Admitting: Family Medicine

## 2015-01-04 ENCOUNTER — Ambulatory Visit: Payer: Medicaid Other | Admitting: Oncology

## 2015-01-21 ENCOUNTER — Ambulatory Visit (HOSPITAL_BASED_OUTPATIENT_CLINIC_OR_DEPARTMENT_OTHER): Payer: Self-pay | Admitting: Oncology

## 2015-01-21 ENCOUNTER — Telehealth: Payer: Self-pay | Admitting: Oncology

## 2015-01-21 VITALS — BP 113/64 | HR 78 | Temp 98.9°F | Resp 18 | Ht 65.0 in | Wt 284.3 lb

## 2015-01-21 DIAGNOSIS — C7A020 Malignant carcinoid tumor of the appendix: Secondary | ICD-10-CM

## 2015-01-21 NOTE — Telephone Encounter (Signed)
Gave adn printed appt sched and avs fo rpt for Aug 2017 °

## 2015-01-21 NOTE — Progress Notes (Signed)
  [name] OFFICE PROGRESS NOTE   Diagnosis: Carcinoid tumor  INTERVAL HISTORY:   [name] [name] returns as scheduled. She feels well. No fever, abdominal pain, flushing, or diarrhea. She has noted a "knot "at the left abdomen near the midline incision since June. This area is most prominent when she is standing. No associated symptoms.  Objective:  Vital signs in last 24 hours:  Blood pressure 113/64, pulse 78, temperature 98.9 F (37.2 C), temperature source Oral, resp. rate 18, height 5\' 5"  (1.651 m), weight 284 lb 4.8 oz (128.958 kg), SpO2 99 %.    HEENT: Neck without mass Lymphatics: No cervical, supraclavicular, axillary, or inguinal nodes Resp: Lungs clear bilaterally, no respiratory distress Cardio: Regular rate and rhythm GI: No hepatomegaly, no mass, nontender. There is a fullness at the left of the in for aspect of the midline incision that is most apparent when she is standing. The fullness is reducible. Vascular: No leg edema   Medications: I have reviewed the patient's current medications.  Assessment/Plan: 1. 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  2. Right colectomy 11/10/2012 secondary to a bowel perforation at the cecum.  3. Cesarean section 11/03/2012.  4. G3 P3.  5. Pancreatitis,? Gallstone-related-status post a cholecystectomy 12/23/2012. 6. Left abdomen fullness-most likely an incisional hernia  Disposition:  [name] [name] remains in clinical remission from the carcinoid tumor. The left abdomen fullness is most likely a hernia related to surgery. She will contact us or Dr. Marlou Starks if this area becomes painful or enlarges. She will return for an office visit in one year.  Betsy Coder, MD  01/21/2015  12:20 PM

## 2016-01-20 ENCOUNTER — Ambulatory Visit (HOSPITAL_BASED_OUTPATIENT_CLINIC_OR_DEPARTMENT_OTHER): Payer: Medicaid Other | Admitting: Nurse Practitioner

## 2016-01-20 ENCOUNTER — Encounter: Payer: Self-pay | Admitting: Nurse Practitioner

## 2016-01-20 ENCOUNTER — Telehealth: Payer: Self-pay | Admitting: Oncology

## 2016-01-20 VITALS — BP 118/73 | HR 88 | Temp 98.6°F | Resp 16 | Wt 292.5 lb

## 2016-01-20 DIAGNOSIS — C7A020 Malignant carcinoid tumor of the appendix: Secondary | ICD-10-CM

## 2016-01-20 DIAGNOSIS — Z85020 Personal history of malignant carcinoid tumor of stomach: Secondary | ICD-10-CM

## 2016-01-20 NOTE — Telephone Encounter (Signed)
Gave pt cal & avs °

## 2016-01-20 NOTE — Progress Notes (Signed)
  [facility] OFFICE PROGRESS NOTE   Diagnosis:  Carcinoid tumor  INTERVAL HISTORY:   [name] [name] returns as scheduled. She feels well. No complaints. No flushing episodes. No diarrhea. She denies abdominal pain. She thinks the abdominal hernia is slightly larger. There are no associated symptoms.  Objective:  Vital signs in last 24 hours:  Blood pressure 118/73, pulse 88, temperature 98.6 F (37 C), resp. rate 16, weight 292 lb 8 oz (132.7 kg), SpO2 99 %.    HEENT: No neck mass. Lymphatics: No palpable cervical, supraclavicular, axillary or inguinal lymph nodes. Resp: Lungs clear bilaterally. Cardio: Regular rate and rhythm. GI: Abdomen soft and nontender. No hepatomegaly. No mass. Soft reducible fullness left lower abdomen just lateral to the midline incision. Vascular: No leg edema.    Lab Results:  Lab Results  Component Value Date   WBC 5.5 12/24/2012   HGB 9.8 (L) 12/24/2012   HCT 30.2 (L) 12/24/2012   MCV 88.0 12/24/2012   PLT 191 12/24/2012   NEUTROABS 8.9 (H) 12/20/2012    Imaging:  No results found.  Medications: I have reviewed the patient's current medications.  Assessment/Plan: 1. 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  2. Right colectomy 11/10/2012 secondary to a bowel perforation at the cecum.  3. Cesarean section 11/03/2012.  4. G3 P3.  5. Pancreatitis,? Gallstone-related-status post a cholecystectomy 12/23/2012. 6. Left abdomen fullness-most likely an incisional hernia   Disposition:[name] [name] remains in clinical remission from the carcinoid tumor. She will return for a follow-up visit in one year.   She understands to contact Dr. Marlou Starks if the fullness at the left abdomen enlarges or becomes painful.    Ned Card ANP/GNP-BC   01/20/2016  9:54 AM

## 2016-09-28 ENCOUNTER — Emergency Department (HOSPITAL_COMMUNITY): Payer: Medicaid Other

## 2016-09-28 ENCOUNTER — Encounter (HOSPITAL_COMMUNITY): Payer: Self-pay | Admitting: Emergency Medicine

## 2016-09-28 ENCOUNTER — Emergency Department (HOSPITAL_COMMUNITY)
Admission: EM | Admit: 2016-09-28 | Discharge: 2016-09-29 | Disposition: A | Discharge status: Home / Self Care | Payer: Medicaid Other | Attending: Emergency Medicine | Admitting: Emergency Medicine

## 2016-09-28 DIAGNOSIS — Z8500 Personal history of malignant neoplasm of unspecified digestive organ: Secondary | ICD-10-CM | POA: Insufficient documentation

## 2016-09-28 DIAGNOSIS — Z7982 Long term (current) use of aspirin: Secondary | ICD-10-CM | POA: Insufficient documentation

## 2016-09-28 DIAGNOSIS — R1032 Left lower quadrant pain: Secondary | ICD-10-CM | POA: Diagnosis present

## 2016-09-28 DIAGNOSIS — J45909 Unspecified asthma, uncomplicated: Secondary | ICD-10-CM | POA: Diagnosis not present

## 2016-09-28 DIAGNOSIS — Z79899 Other long term (current) drug therapy: Secondary | ICD-10-CM | POA: Diagnosis not present

## 2016-09-28 DIAGNOSIS — K5792 Diverticulitis of intestine, part unspecified, without perforation or abscess without bleeding: Secondary | ICD-10-CM

## 2016-09-28 LAB — I-STAT CHEM 8, ED
BUN: 12 mg/dL (ref 6–20)
CALCIUM ION: 1.12 mmol/L — AB (ref 1.15–1.40)
CHLORIDE: 107 mmol/L (ref 101–111)
Creatinine, Ser: 0.5 mg/dL (ref 0.44–1.00)
Glucose, Bld: 92 mg/dL (ref 65–99)
HCT: 38 % (ref 36.0–46.0)
Hemoglobin: 12.9 g/dL (ref 12.0–15.0)
POTASSIUM: 3.7 mmol/L (ref 3.5–5.1)
SODIUM: 140 mmol/L (ref 135–145)
TCO2: 28 mmol/L (ref 0–100)

## 2016-09-28 LAB — COMPREHENSIVE METABOLIC PANEL
ALT: 18 U/L (ref 14–54)
AST: 20 U/L (ref 15–41)
Albumin: 3.9 g/dL (ref 3.5–5.0)
Alkaline Phosphatase: 52 U/L (ref 38–126)
Anion gap: 9 (ref 5–15)
BILIRUBIN TOTAL: 0.7 mg/dL (ref 0.3–1.2)
BUN: 11 mg/dL (ref 6–20)
CHLORIDE: 107 mmol/L (ref 101–111)
CO2: 25 mmol/L (ref 22–32)
CREATININE: 0.62 mg/dL (ref 0.44–1.00)
Calcium: 9.2 mg/dL (ref 8.9–10.3)
Glucose, Bld: 94 mg/dL (ref 65–99)
Potassium: 3.7 mmol/L (ref 3.5–5.1)
Sodium: 141 mmol/L (ref 135–145)
TOTAL PROTEIN: 8.5 g/dL — AB (ref 6.5–8.1)

## 2016-09-28 LAB — URINALYSIS, ROUTINE W REFLEX MICROSCOPIC
Bacteria, UA: NONE SEEN
Bilirubin Urine: NEGATIVE
GLUCOSE, UA: NEGATIVE mg/dL
KETONES UR: 20 mg/dL — AB
NITRITE: NEGATIVE
PH: 6 (ref 5.0–8.0)
Protein, ur: NEGATIVE mg/dL
SPECIFIC GRAVITY, URINE: 1.023 (ref 1.005–1.030)

## 2016-09-28 LAB — LIPASE, BLOOD: Lipase: 25 U/L (ref 11–51)

## 2016-09-28 LAB — I-STAT BETA HCG BLOOD, ED (MC, WL, AP ONLY): I-stat hCG, quantitative: <5 m[IU]/mL (ref <5)

## 2016-09-28 LAB — I-STAT CG4 LACTIC ACID, ED: Lactic Acid, Venous: 1.45 mmol/L (ref 0.5–1.9)

## 2016-09-28 LAB — CBC
HEMATOCRIT: 38.4 % (ref 36.0–46.0)
HEMOGLOBIN: 12.7 g/dL (ref 12.0–15.0)
MCH: 28.6 pg (ref 26.0–34.0)
MCHC: 33.1 g/dL (ref 30.0–36.0)
MCV: 86.5 fL (ref 78.0–100.0)
Platelets: 250 10*3/uL (ref 150–400)
RBC: 4.44 MIL/uL (ref 3.87–5.11)
RDW: 13.9 % (ref 11.5–15.5)
WBC: 8.9 10*3/uL (ref 4.0–10.5)

## 2016-09-28 MED ORDER — TRAMADOL HCL 50 MG PO TABS: 50.0000 mg | ORAL_TABLET | Freq: Four times a day (QID) | ORAL | 0 refills | Status: DC | PRN

## 2016-09-28 MED ORDER — ONDANSETRON 4 MG PO TBDP
ORAL_TABLET | ORAL | 0 refills | Status: DC
Start: 2016-09-28 — End: 2017-01-07

## 2016-09-28 MED ORDER — OXYCODONE-ACETAMINOPHEN 5-325 MG PO TABS
1.0000 | ORAL_TABLET | Freq: Once | ORAL | Status: AC
  Administered 2016-09-28: 1 via ORAL
  Filled 2016-09-28: qty 1

## 2016-09-28 MED ORDER — CIPROFLOXACIN HCL 500 MG PO TABS: 500.0000 mg | ORAL_TABLET | Freq: Two times a day (BID) | ORAL | 0 refills | Status: DC

## 2016-09-28 MED ORDER — CIPROFLOXACIN HCL 500 MG PO TABS
500.0000 mg | ORAL_TABLET | Freq: Once | ORAL | Status: AC
  Administered 2016-09-28: 500 mg via ORAL
  Filled 2016-09-28: qty 1

## 2016-09-28 MED ORDER — METRONIDAZOLE 500 MG PO TABS: 500.0000 mg | ORAL_TABLET | Freq: Three times a day (TID) | ORAL | 0 refills | Status: DC

## 2016-09-28 MED ORDER — IOPAMIDOL (ISOVUE-300) INJECTION 61%
INTRAVENOUS | Status: AC
  Filled 2016-09-28: qty 100

## 2016-09-28 MED ORDER — METRONIDAZOLE 500 MG PO TABS
500.0000 mg | ORAL_TABLET | Freq: Once | ORAL | Status: AC
  Administered 2016-09-28: 500 mg via ORAL
  Filled 2016-09-28: qty 1

## 2016-09-28 MED ORDER — IOPAMIDOL (ISOVUE-300) INJECTION 61%
100.0000 mL | Freq: Once | INTRAVENOUS | Status: AC | PRN
  Administered 2016-09-28: 100 mL via INTRAVENOUS

## 2016-09-28 NOTE — ED Notes (Signed)
Pt c/o abdominal pain 7/10 pt states pain is worse to LLQ, that are described as sharp, labor and shooting pains. Pt states that she has been having this pain x 3 day, pt reports nausea but denies vomiting or diarrhea

## 2016-09-28 NOTE — ED Provider Notes (Signed)
[facility] DEPT Provider Note   CSN: [id number] Arrival date & time: 09/28/16  1832     History   Chief Complaint Chief Complaint  Patient presents with  . Abdominal Pain    HPI [name] [name] is a 36 y.o. female.  Patient is a 36 year old female who presents with left-sided abdominal pain. She has a history of a left partial colectomy status post colon perforation after a C-section in 2014.  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 She states that 2 days ago she had some mild pain in her left lower abdomen and has progressively worsened. She now has sharp intermittent cramping pains that radiate toward her back. She's had some nausea but no vomiting. She is currently on her normal menstrual cycle but denies any abnormal vaginal bleeding or vaginal discharge. She denies any fevers. No urinary symptoms. She reports some prior fullness of the left abdomen which she feels is an incisional hernia of her prior surgery.      Past Medical History:  Diagnosis Date  . Asthma    childhood exercise induced only  . Cholecystitis chronic, acute   . Gallstones   . Headache(784.0)    migraines  . Heart murmur    no indications for 7-38yrs per pt.    Patient Active Problem List   Diagnosis Date Noted  . Gallstone pancreatitis 12/20/2012  . Colon perforation ([place]) 12/09/2012  . 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 Breech presentation without mention of version, delivered 11/06/2012  . Cholelithiasis 11/06/2012  . IUP (intrauterine pregnancy), incidental 03/18/2012  . Overweight(278.02) 08/08/2007  . MIGRAINE HEADACHE 02/28/2007    Past Surgical History:  Procedure Laterality Date  . APPENDECTOMY    . CESAREAN SECTION N/A 11/03/2012   Procedure: PRIMARY CESAREAN SECTION;  Surgeon: Melina Schools, MD;  Location: [facility];  Service: Obstetrics;  Laterality: N/A;  . CHOLECYSTECTOMY N/A  12/23/2012   Procedure: LAPAROSCOPIC CHOLECYSTECTOMY WITH INTRAOPERATIVE CHOLANGIOGRAM;  Surgeon: Pedro Earls, MD;  Location: [facility];  Service: General;  Laterality: N/A;  Laparoscopic Cholecystectomy with IOC  . ERCP N/A 12/22/2012   Procedure: ENDOSCOPIC RETROGRADE CHOLANGIOPANCREATOGRAPHY (ERCP);  Surgeon: Gatha Mayer, MD;  Location: [facility];  Service: Endoscopy;  Laterality: N/A;  . LAPAROTOMY N/A 11/08/2012   Procedure: laparoscopic exploration and EXPLORATORY LAPAROTOMY;  Surgeon: Merrie Roof, MD;  Location: [facility];  Service: General;  Laterality: N/A;    OB History    Gravida Para Term Preterm AB Living   3 3 1     3    SAB TAB Ectopic Multiple Live Births           1       Home Medications    Prior to Admission medications   Medication Sig Start Date End Date Taking? Authorizing Provider  aspirin-acetaminophen-caffeine (EXCEDRIN MIGRAINE) [phone] MG tablet Take 1 tablet by mouth every 6 (six) hours as needed for headache.   Yes Historical Provider, MD  ibuprofen (ADVIL,MOTRIN) 200 MG tablet Take 200 mg by mouth every 6 (six) hours as needed for fever, headache, mild pain or moderate pain.   Yes Historical Provider, MD  ciprofloxacin (CIPRO) 500 MG tablet Take 1 tablet (500 mg total) by mouth 2 (two) times daily. One po bid x 7 days 09/28/16   Malvin Johns, MD  metroNIDAZOLE (FLAGYL) 500 MG tablet Take 1 tablet (500 mg total) by mouth 3 (three)  times daily. One po bid x 7 days 09/28/16   Malvin Johns, MD  ondansetron (ZOFRAN ODT) 4 MG disintegrating tablet 4mg  ODT q4 hours prn nausea/vomit 09/28/16   Malvin Johns, MD  traMADol (ULTRAM) 50 MG tablet Take 1 tablet (50 mg total) by mouth every 6 (six) hours as needed. 09/28/16   Malvin Johns, MD    Family History Family History  Problem Relation Age of Onset  . ADD / ADHD      family hx  . Breast cancer      fhx  . Mental illness      fhx  . Heart disease      fhx  . Hypertension      fhx    Social  History Social History  Substance Use Topics  . Smoking status: Never Smoker  . Smokeless tobacco: Never Used  . Alcohol use No     Allergies   Patient has no known allergies.   Review of Systems Review of Systems  Constitutional: Negative for chills, diaphoresis, fatigue and fever.  HENT: Negative for congestion, rhinorrhea and sneezing.   Eyes: Negative.   Respiratory: Negative for cough, chest tightness and shortness of breath.   Cardiovascular: Negative for chest pain and leg swelling.  Gastrointestinal: Positive for abdominal pain and nausea. Negative for blood in stool, diarrhea and vomiting.  Genitourinary: Positive for vaginal bleeding. Negative for difficulty urinating, flank pain, frequency, hematuria and vaginal discharge.  Musculoskeletal: Negative for arthralgias and back pain.  Skin: Negative for rash.  Neurological: Negative for dizziness, speech difficulty, weakness, numbness and headaches.     Physical Exam Updated Vital Signs BP 114/72 (BP Location: Right Arm)   Pulse 81   Temp 98.3 F (36.8 C) (Oral)   Resp 18   Ht 5\' 4"  (1.626 m)   Wt 289 lb (131.1 kg)   LMP 09/23/2016 Comment: HCG neg 09/28/16  SpO2 98%   BMI 49.61 kg/m   Physical Exam  Constitutional: She is oriented to person, place, and time. She appears well-developed and well-nourished.  HENT:  Head: Normocephalic and atraumatic.  Eyes: Pupils are equal, round, and reactive to light.  Neck: Normal range of motion. Neck supple.  Cardiovascular: Normal rate, regular rhythm and normal heart sounds.   Pulmonary/Chest: Effort normal and breath sounds normal. No respiratory distress. She has no wheezes. She has no rales. She exhibits no tenderness.  Abdominal: Soft. Bowel sounds are normal. There is tenderness. There is no rebound and no guarding.  Moderate tenderness to the left lower abdomen, left midabdomen and left flank  Musculoskeletal: Normal range of motion. She exhibits no edema.    Lymphadenopathy:    She has no cervical adenopathy.  Neurological: She is alert and oriented to person, place, and time.  Skin: Skin is warm and dry. No rash noted.  Psychiatric: She has a normal mood and affect.     ED Treatments / Results  Labs (all labs ordered are listed, but only abnormal results are displayed) Labs Reviewed  COMPREHENSIVE METABOLIC PANEL - Abnormal; Notable for the following:       Result Value   Total Protein 8.5 (*)    All other components within normal limits  URINALYSIS, ROUTINE W REFLEX MICROSCOPIC - Abnormal; Notable for the following:    APPearance HAZY (*)    Hgb urine dipstick LARGE (*)    Ketones, ur 20 (*)    Leukocytes, UA SMALL (*)    Squamous Epithelial / LPF 0-5 (*)  All other components within normal limits  I-STAT CHEM 8, ED - Abnormal; Notable for the following:    Calcium, Ion 1.12 (*)    All other components within normal limits  LIPASE, BLOOD  CBC  I-STAT BETA HCG BLOOD, ED ([facility], [facility], [facility] ONLY)  I-STAT CG4 LACTIC ACID, ED    EKG  EKG Interpretation None       Radiology Ct Abdomen Pelvis W Contrast  Result Date: 09/28/2016 CLINICAL DATA:  LEFT lower quadrant abdominal pain, sharp and shooting for 3 days. Nausea. Evaluate LEFT flank pain. History of exploratory laparotomy, cholecystectomy, appendix carcinoid tumor, pancreatitis. EXAM: CT ABDOMEN AND PELVIS WITH CONTRAST TECHNIQUE: Multidetector CT imaging of the abdomen and pelvis was performed using the standard protocol following bolus administration of intravenous contrast. CONTRAST:  139mL ISOVUE-300 IOPAMIDOL (ISOVUE-300) INJECTION 61% COMPARISON:  None. FINDINGS: LOWER CHEST: Lung bases are clear. Included heart size is normal. No pericardial effusion. HEPATOBILIARY: Status post cholecystectomy.  Normal liver. PANCREAS: Normal. SPLEEN: 17 mm benign-appearing splenic hypodensity (cyst versus lymphangioma), spleen is otherwise unremarkable. ADRENALS/URINARY TRACT: Kidneys are  orthotopic, demonstrating symmetric enhancement. No nephrolithiasis, hydronephrosis or solid renal masses. The unopacified ureters are normal in course and caliber. Urinary bladder is partially distended and unremarkable. Normal adrenal glands. STOMACH/BOWEL: Moderate sigmoid diverticulosis with superimposed short segment of sigmoid wall thickening, pericolonic inflammation. The stomach, small bowel are normal in course and caliber without inflammatory changes. Status post appendectomy. VASCULAR/LYMPHATIC: Aortoiliac vessels are normal in course and caliber. No lymphadenopathy by CT size criteria. REPRODUCTIVE: Normal. OTHER: No intraperitoneal free fluid or free air. Or focal fluid collection. MUSCULOSKELETAL: Nonacute. 4.7 cm neck moderate fat containing infra umbilical ventral hernia. Small a moderate fat containing umbilical hernia. Mild sacroiliac osteoarthrosis. Moderate to severe L5-S1 disc height loss with endplate spurring compatible with degenerative disc. IMPRESSION: Acute mild sigmoid diverticulitis without complication. Status post cholecystectomy and appendectomy. Electronically Signed   By: Elon Alas M.D.   On: 09/28/2016 22:32    Procedures Procedures (including critical care time)  Medications Ordered in ED Medications  iopamidol (ISOVUE-300) 61 % injection (not administered)  ciprofloxacin (CIPRO) tablet 500 mg (not administered)  metroNIDAZOLE (FLAGYL) tablet 500 mg (not administered)  oxyCODONE-acetaminophen (PERCOCET/ROXICET) 5-325 MG per tablet 1 tablet (not administered)  iopamidol (ISOVUE-300) 61 % injection 100 mL (100 mLs Intravenous Contrast Given 09/28/16 2212)     Initial Impression / Assessment and Plan / ED Course  I have reviewed the triage vital signs and the nursing notes.  Pertinent labs & imaging results that were available during my care of the patient were reviewed by me and considered in my medical decision making (see chart for details).      Patient presents with left-sided abdominal pain. CT scan shows diverticulitis. There is no evidence of perforation or abscess. Patient is otherwise well-appearing. She was started on Cipro and Flagyl. She was also given prescriptions for tramadol and Zofran. She was encouraged to have follow-up with her primary care physician. Return precautions were given.  Final Clinical Impressions(s) / ED Diagnoses   Final diagnoses:  Diverticulitis    New Prescriptions New Prescriptions   CIPROFLOXACIN (CIPRO) 500 MG TABLET    Take 1 tablet (500 mg total) by mouth 2 (two) times daily. One po bid x 7 days   METRONIDAZOLE (FLAGYL) 500 MG TABLET    Take 1 tablet (500 mg total) by mouth 3 (three) times daily. One po bid x 7 days   ONDANSETRON (ZOFRAN ODT) 4 MG DISINTEGRATING  TABLET    4mg  ODT q4 hours prn nausea/vomit   TRAMADOL (ULTRAM) 50 MG TABLET    Take 1 tablet (50 mg total) by mouth every 6 (six) hours as needed.     Malvin Johns, MD 09/28/16 [phone]

## 2016-09-28 NOTE — ED Triage Notes (Signed)
Pt verbalizes lower abdominal sharpness onset Wednesday; pain radiates from lower left to lower right; associated n/v new today.

## 2017-01-07 ENCOUNTER — Encounter: Payer: Self-pay | Admitting: Family Medicine

## 2017-01-07 ENCOUNTER — Ambulatory Visit (INDEPENDENT_AMBULATORY_CARE_PROVIDER_SITE_OTHER): Payer: Self-pay | Admitting: Family Medicine

## 2017-01-07 VITALS — BP 120/80 | HR 85 | Resp 12 | Ht 64.0 in | Wt 294.0 lb

## 2017-01-07 DIAGNOSIS — F321 Major depressive disorder, single episode, moderate: Secondary | ICD-10-CM

## 2017-01-07 DIAGNOSIS — G43709 Chronic migraine without aura, not intractable, without status migrainosus: Secondary | ICD-10-CM

## 2017-01-07 DIAGNOSIS — E669 Obesity, unspecified: Secondary | ICD-10-CM

## 2017-01-07 DIAGNOSIS — Z6841 Body Mass Index (BMI) 40.0 and over, adult: Secondary | ICD-10-CM

## 2017-01-07 DIAGNOSIS — IMO0001 Reserved for inherently not codable concepts without codable children: Secondary | ICD-10-CM

## 2017-01-07 DIAGNOSIS — G4700 Insomnia, unspecified: Secondary | ICD-10-CM

## 2017-01-07 MED ORDER — FLUOXETINE HCL 20 MG PO TABS: 20.0000 mg | ORAL_TABLET | Freq: Every day | ORAL | 1 refills | Status: DC

## 2017-01-07 NOTE — Patient Instructions (Signed)
A few things to remember from today's visit:   Insomnia, unspecified type  Moderate major depression ([place]) - Plan: FLUoxetine (PROZAC) 20 MG tablet  Today we started Fluoxetine, this type of medications can increase suicidal risk. This is more prevalent among children,adolecents, and young adults with major depression or other psychiatric disorders. It can also make depression worse. Most common side effects are gastrointestinal, self limited after a few weeks: diarrhea, nausea, constipation  Or diarrhea among some.  In general it is well tolerated. We will follow closely.      Please be sure medication list is accurate. If a new problem present, please set up appointment sooner than planned today.

## 2017-01-07 NOTE — Progress Notes (Signed)
HPI:   [name] [name] is a 36 y.o. female, who is here today with her fiance to establish care.  Former PCP: Dr Sherren Mocha Last preventive routine visit: 4 years ago.   Chronic medical problems: Obesity, migraines,depresion among some. She follows with oncologists, Hx of carcinoid tumor of appendix.   Concerns today: Migraines and "menthal health."   Concerned about depressed mood and irritability. Dx around 36 years old. She was on Celexa until 4 years ago. She felt like she can do it with no treatment but for the past 1-2 years she feels like problem is getting worse, she feels "everwhelmed with life", denies suicidal thoughts but she feels like family will be better off without her.  Insomnia, sometimes she cannot sleep at all, "cannot shot my mind down." She has tried Melatonin and sometimes it helps "too much", next day she is very drowsy. Frequently it does not help.  She lives with her 3 children (15,17,and 25 yo) and her fiance.  She has gone through some health issues for the past 4 years. ER visit 09/2016 Dx with diverticulitis.  Depression screen PHQ 2/9 01/07/2017  Decreased Interest 1  Down, Depressed, Hopeless 3  PHQ - 2 Score 4  Altered sleeping 3  Tired, decreased energy 3  Change in appetite 1  Feeling bad or failure about yourself  2  Trouble concentrating 0  Moving slowly or fidgety/restless 0  Suicidal thoughts 1  PHQ-9 Score 14  Difficult doing work/chores Very difficult    Migraines: For years she had migraines around her menstrual periods but seems more often for at least a year or 2. She gets a "bad" headache about 2 per month with nausea and photosensitive."Miniheadaches" in between (1x wk), the latter ones are milder and with no associated nausea or photophobia. Headache is parietal and frontotemporal, sometimes she feels like her head is going to "explot."  She has not identified exacerbating factors.  Aspirin, Ibuprofen,or Excedrin  migraine do not help.  LMP 12/24/16.  She does not exercise regularly and has not been consistent with a healthy diet. She states that she is an "emotional eater."  Review of Systems  Constitutional: Positive for fatigue. Negative for activity change, appetite change and fever.  HENT: Negative for mouth sores, nosebleeds, sore throat, trouble swallowing and voice change.   Eyes: Positive for photophobia. Negative for redness and visual disturbance.  Respiratory: Negative for shortness of breath and wheezing.   Cardiovascular: Negative for chest pain, palpitations and leg swelling.  Gastrointestinal: Positive for nausea. Negative for abdominal pain and vomiting.       Negative for changes in bowel habits.  Endocrine: Negative for cold intolerance and heat intolerance.  Genitourinary: Negative for decreased urine volume, frequency and hematuria.  Musculoskeletal: Negative for gait problem and myalgias.  Skin: Negative for rash.  Neurological: Positive for headaches. Negative for seizures, syncope and weakness.  Hematological: Negative for adenopathy. Does not bruise/bleed easily.  Psychiatric/Behavioral: Positive for sleep disturbance. Negative for confusion, hallucinations and suicidal ideas. The patient is nervous/anxious.       Current Outpatient Prescriptions on File Prior to Visit  Medication Sig Dispense Refill  . aspirin-acetaminophen-caffeine (EXCEDRIN MIGRAINE) 250-250-65 MG tablet Take 1 tablet by mouth every 6 (six) hours as needed for headache.     No current facility-administered medications on file prior to visit.      Past Medical History:  Diagnosis Date  . Anxiety   . Asthma    childhood  exercise induced only  . Cholecystitis chronic, acute   . Depression   . Gallstones   . Headache(784.0)    migraines  . Heart murmur    no indications for 7-16yrs per pt.   Past Surgical History:  Procedure Laterality Date  . APPENDECTOMY    . CESAREAN SECTION N/A  11/03/2012   Procedure: PRIMARY CESAREAN SECTION;  Surgeon: Melina Schools, MD;  Location: [facility];  Service: Obstetrics;  Laterality: N/A;  . CHOLECYSTECTOMY N/A 12/23/2012   Procedure: LAPAROSCOPIC CHOLECYSTECTOMY WITH INTRAOPERATIVE CHOLANGIOGRAM;  Surgeon: Pedro Earls, MD;  Location: [facility];  Service: General;  Laterality: N/A;  Laparoscopic Cholecystectomy with IOC  . ERCP N/A 12/22/2012   Procedure: ENDOSCOPIC RETROGRADE CHOLANGIOPANCREATOGRAPHY (ERCP);  Surgeon: Gatha Mayer, MD;  Location: [facility];  Service: Endoscopy;  Laterality: N/A;  . LAPAROTOMY N/A 11/08/2012   Procedure: laparoscopic exploration and EXPLORATORY LAPAROTOMY;  Surgeon: Merrie Roof, MD;  Location: [facility];  Service: General;  Laterality: N/A;    No Known Allergies  Family History  Problem Relation Age of Onset  . Cancer Father        Leukemia  . ADD / ADHD Unknown        family hx  . Breast cancer Unknown        fhx  . Mental illness Unknown        fhx  . Heart disease Unknown        fhx  . Hypertension Unknown        fhx    Social History   Social History  . Marital status: Single    Spouse name: N/A  . Number of children: 3  . Years of education: N/A   Occupational History  .      [employer]-part time   Social History Main Topics  . Smoking status: Never Smoker  . Smokeless tobacco: Never Used  . Alcohol use No  . Drug use: No  . Sexual activity: Yes    Partners: Male    Birth control/ protection: None   Other Topics Concern  . None   Social History Narrative   Single, lives with significant other [name])   #3 children: ages 13-11-2 1/2 months   Employer: [name]   Independent ADLs    Vitals:   01/07/17 1456  BP: 120/80  Pulse: 85  Resp: 12  SpO2: 98%  O2 sat at RA 98%  Body mass index is 50.46 kg/m.   Physical Exam  Nursing note and vitals reviewed. Constitutional: She is oriented to person, place, and time. She appears well-developed. No distress.    HENT:  Head: Atraumatic.  Mouth/Throat: Oropharynx is clear and moist and mucous membranes are normal.  Eyes: Pupils are equal, round, and reactive to light. Conjunctivae and EOM are normal.  Neck: No tracheal deviation present. No thyroid mass and no thyromegaly present.  Cardiovascular: Normal rate and regular rhythm.   No murmur heard. Pulses:      Dorsalis pedis pulses are 2+ on the right side, and 2+ on the left side.  Respiratory: Effort normal and breath sounds normal. No respiratory distress.  GI: Soft. She exhibits no mass. There is no hepatomegaly. There is no tenderness.  Musculoskeletal: She exhibits no edema.       Cervical back: She exhibits no tenderness and no bony tenderness.  Lymphadenopathy:    She has no cervical adenopathy.  Neurological: She is alert and oriented to person, place, and  time. She has normal strength. No cranial nerve deficit. Coordination and gait normal.  Skin: Skin is warm. No erythema.  Psychiatric: Her mood appears anxious. Her affect is labile. She expresses no suicidal ideation.  Fairly groomed, good eye contact.    ASSESSMENT AND PLAN:   Ms. Lonita was seen today for establish care.  Diagnoses and all orders for this visit:  Chronic migraine without aura without status migrainosus, not intractable  With some milder headaches that seem more tension like headaches. We discussed treatment options, Topamax and Amitriptyline. She is not on OCP's ,not using any birth control,so I am not recommending Topamax. She is not interested in Amitriptyline for now after discussion of some side effects. Instructed about warning signs.  Insomnia, unspecified type  Possible etiologies discussed, it can be part of her depression. For now I recommend good sleep hygiene. Fluoxetine may help.   Moderate major depression ([place])  After discussion of a few treatment options, she agrees with trying Fluoxetine, start with 1/2 tab and increase to 1 tab as  tolerated. Some side effects discussed. Healthy diet and regular exercise may also help. Psychotherapy is also available. Instructed about warning signs. F/U in 6 weeks, before if needed.  -     FLUoxetine (PROZAC) 20 MG tablet; Take 1 tablet (20 mg total) by mouth daily.  Class 3 obesity without serious comorbidity with body mass index (BMI) of 50.0 to 59.9 in adult, unspecified obesity type ([place])  We discussed benefits of wt loss as well as adverse effects of obesity. Consistency with healthy diet and physical activity recommended. Daily brisk walking for 15-30 min as tolerated.    Shakeda Pearse G. [place], MD  [facility]. [place] office.

## 2017-01-10 ENCOUNTER — Encounter: Payer: Self-pay | Admitting: Family Medicine

## 2017-01-11 ENCOUNTER — Telehealth: Payer: Self-pay

## 2017-01-11 NOTE — Telephone Encounter (Signed)
Received PA request for Fluoxetine. PA submitted via phone with [place] [employer]. #[id number].

## 2017-01-14 ENCOUNTER — Telehealth: Payer: Self-pay | Admitting: General Practice

## 2017-01-14 NOTE — Telephone Encounter (Signed)
Pt calling needing a PA for fluoxetine.  Pt state that she called the pharmacy and they stated that she need to call her PCP.

## 2017-01-15 NOTE — Telephone Encounter (Signed)
PA started over the phone with Medicaid. DZ:[id number]

## 2017-01-16 NOTE — Telephone Encounter (Signed)
The pa has been denied for the tablets. Medicaid will cover the Fluoxetine capsules. Okay to switch to capsules?

## 2017-01-17 ENCOUNTER — Other Ambulatory Visit: Payer: Self-pay | Admitting: Family Medicine

## 2017-01-17 DIAGNOSIS — F321 Major depressive disorder, single episode, moderate: Secondary | ICD-10-CM

## 2017-01-17 MED ORDER — FLUOXETINE HCL 20 MG PO CAPS: 20.0000 mg | ORAL_CAPSULE | Freq: Every day | ORAL | 1 refills | Status: DC

## 2017-01-17 NOTE — Telephone Encounter (Signed)
Rx for Fluoxetine caps sent to her pharmacy. Thanks, BJ

## 2017-01-21 ENCOUNTER — Ambulatory Visit: Payer: Medicaid Other | Admitting: Oncology

## 2017-02-17 NOTE — Progress Notes (Signed)
HPI:   [name] is a 36 y.o. female, who is here today to follow on recent OV.   She was seen on 01/07/17, when she was c/o "mental health" issues. Depression and irritability. Depression since 36 years of age. She tried Celexa before.  Fluoxetine 20 mg was recommended.  Crying spells and irritability have resolved. She feels like she can be repeated with stress, she can think about specific situation she is confronting and make a decision without "panicking." She denies suicidal thoughts.  + Insomnia last OV she stated: "Cannot shut my mind down", this has resolved.  Sleep improved the first 2 weeks and now back to her usual. She goes to bed between 10-10:30 pm and wakes up at 3: 30 am, cannot go back to sleep. + Fatigue.  Headache: Hx of migraines. Parietal and fronto temporal headaches. Some times associated with nausea. "Bad headaches 2 monthly and milder headaches once weekly.  According to patient, she is having "more headaches but not strong" as they used to be. States that she is able to function. She has not had associated nausea for the past 2-3 weeks.  She denies vomiting or photophobia. Headache is dull, 3/10 bitemporal and radiates to lateral occipital area and neck bilateral. She has not identified exacerbating factors, they resolve spontaneously.  She is eating smaller portions more frequent, trying to eat breakfast. She feels like Fluoxetine has help with appetite. She started exercising regularly. She feels like clothes fit better.   Review of Systems  Constitutional: Positive for fatigue. Negative for activity change, appetite change and fever.  HENT: Negative for mouth sores, nosebleeds, sore throat and trouble swallowing.   Eyes: Negative for redness and visual disturbance.  Respiratory: Negative for chest tightness, shortness of breath and wheezing.   Cardiovascular: Negative for palpitations and leg swelling.  Gastrointestinal:  Negative for abdominal pain, nausea and vomiting.       Negative for changes in bowel habits.  Endocrine: Negative for cold intolerance and heat intolerance.  Genitourinary: Negative for decreased urine volume and hematuria.  Musculoskeletal: Positive for neck pain. Negative for gait problem.  Skin: Negative for pallor and rash.  Neurological: Positive for headaches. Negative for seizures, syncope, weakness and numbness.  Psychiatric/Behavioral: Positive for sleep disturbance. Negative for confusion and suicidal ideas. The patient is nervous/anxious.       Current Outpatient Prescriptions on File Prior to Visit  Medication Sig Dispense Refill  . aspirin-acetaminophen-caffeine (EXCEDRIN MIGRAINE) 250-250-65 MG tablet Take 1 tablet by mouth every 6 (six) hours as needed for headache.     No current facility-administered medications on file prior to visit.      Past Medical History:  Diagnosis Date  . Anxiety   . Asthma    childhood exercise induced only  . Cholecystitis chronic, acute   . Depression   . Gallstones   . Headache(784.0)    migraines  . Heart murmur    no indications for 7-25yrs per pt.   No Known Allergies  Social History   Social History  . Marital status: Single    Spouse name: N/A  . Number of children: 3  . Years of education: N/A   Occupational History  .      [employer]-part time   Social History Main Topics  . Smoking status: Never Smoker  . Smokeless tobacco: Never Used  . Alcohol use No  . Drug use: No  . Sexual activity: Yes    Partners:  Male    Birth control/ protection: None   Other Topics Concern  . None   Social History Narrative   Single, lives with significant other [name] [name])   #3 children: ages 13-11-2 1/2 months   Employer: [name]   Independent ADLs    Vitals:   02/18/17 1143  BP: 126/80  Pulse: 91  Resp: 12  SpO2: 98%   Body mass index is 50.31 kg/m.  Wt Readings from Last 3 Encounters:  02/18/17 293 lb 2 oz  (133 kg)  01/07/17 294 lb (133.4 kg)  09/28/16 289 lb (131.1 kg)    Physical Exam  Nursing note and vitals reviewed. Constitutional: She is oriented to person, place, and time. She appears well-developed. No distress.  HENT:  Head: Normocephalic and atraumatic.  Mouth/Throat: Oropharynx is clear and moist and mucous membranes are normal.  Eyes: Pupils are equal, round, and reactive to light. Conjunctivae are normal.  Neck: No tracheal deviation present. No thyroid mass and no thyromegaly present.  Cardiovascular: Normal rate and regular rhythm.   No murmur heard. Pulses:      Dorsalis pedis pulses are 2+ on the right side, and 2+ on the left side.  Respiratory: Effort normal and breath sounds normal. No respiratory distress.  GI: Soft. She exhibits no mass. There is no hepatomegaly. There is no tenderness.  Musculoskeletal: She exhibits no edema or tenderness.       Cervical back: She exhibits normal range of motion, no tenderness and no bony tenderness.  Lymphadenopathy:    She has no cervical adenopathy.  Neurological: She is alert and oriented to person, place, and time. She has normal strength. No cranial nerve deficit. Coordination and gait normal.  Skin: Skin is warm. No erythema.  Psychiatric: Her mood appears anxious. Her affect is not labile. She expresses no suicidal ideation.  Well groomed, good eye contact.    ASSESSMENT AND PLAN:  [name] was seen today for follow-up.  Diagnoses and all orders for this visit:  Chronic migraine without aura without status migrainosus, not intractable  Headaches intensity have improved. Headache she describes seems more tension like. I think she would benefits from Amitriptyline. Discussed some side effects, instructed to start Amitriptyline 12.5 mg and in about 2-3 weeks she can increase it to 25 mg. She will let me know if she has any problems, otherwise I will see her back in 3 months. Instructed about warning signs.  She  is not using birth controlled. Strongly recommend avoiding pregnancy, she understands adverse effects in case of pregnancy. She is hoping her husband will agree with vasectomy.   -     amitriptyline (ELAVIL) 25 MG tablet; Take 1 tablet (25 mg total) by mouth at bedtime.  Insomnia, unspecified type  Today Amitriptyline started. Good sleep hygiene also recommended. We discussed side effects, including daytime drowsiness. Follow-up in 3 months.  -     amitriptyline (ELAVIL) 25 MG tablet; Take 1 tablet (25 mg total) by mouth at bedtime.  Class 3 obesity without serious comorbidity with body mass index (BMI) of 50.0 to 59.9 in adult, unspecified obesity type ([place])  Her weight is overall stable. We discussed benefits of wt loss as well as adverse effects of obesity. Consistency with healthy diet and physical activity recommended. Daily brisk walking for 15-30 min as tolerated.  Moderate major depression (HCC)  Improved. No changes on Fluoxetine 20 mg. Instructed about warning signs.  Follow-up in 3 months.  -     FLUoxetine (  PROZAC) 20 MG capsule; Take 1 capsule (20 mg total) by mouth daily.  Need for influenza vaccination -     Flu Vaccine QUAD 36+ mos IM     Anh Bigos G. Martinique, MD  [facility]. [place] office.

## 2017-02-18 ENCOUNTER — Encounter: Payer: Self-pay | Admitting: Family Medicine

## 2017-02-18 ENCOUNTER — Ambulatory Visit (INDEPENDENT_AMBULATORY_CARE_PROVIDER_SITE_OTHER): Payer: Medicaid Other | Admitting: Family Medicine

## 2017-02-18 VITALS — BP 126/80 | HR 91 | Resp 12 | Ht 64.0 in | Wt 293.1 lb

## 2017-02-18 DIAGNOSIS — G4700 Insomnia, unspecified: Secondary | ICD-10-CM | POA: Diagnosis not present

## 2017-02-18 DIAGNOSIS — Z6841 Body Mass Index (BMI) 40.0 and over, adult: Secondary | ICD-10-CM

## 2017-02-18 DIAGNOSIS — E669 Obesity, unspecified: Secondary | ICD-10-CM | POA: Diagnosis not present

## 2017-02-18 DIAGNOSIS — G43709 Chronic migraine without aura, not intractable, without status migrainosus: Secondary | ICD-10-CM | POA: Diagnosis not present

## 2017-02-18 DIAGNOSIS — Z23 Encounter for immunization: Secondary | ICD-10-CM

## 2017-02-18 DIAGNOSIS — IMO0001 Reserved for inherently not codable concepts without codable children: Secondary | ICD-10-CM

## 2017-02-18 DIAGNOSIS — F321 Major depressive disorder, single episode, moderate: Secondary | ICD-10-CM | POA: Diagnosis not present

## 2017-02-18 MED ORDER — FLUOXETINE HCL 20 MG PO CAPS: 20.0000 mg | ORAL_CAPSULE | Freq: Every day | ORAL | 1 refills | Status: DC

## 2017-02-18 MED ORDER — AMITRIPTYLINE HCL 25 MG PO TABS: 25.0000 mg | ORAL_TABLET | Freq: Every day | ORAL | 2 refills | Status: DC

## 2017-02-18 NOTE — Patient Instructions (Signed)
A few things to remember from today's visit:   Chronic migraine without aura without status migrainosus, not intractable - Plan: amitriptyline (ELAVIL) 25 MG tablet  Moderate major depression (HCC) - Plan: FLUoxetine (PROZAC) 20 MG capsule  Class 3 obesity without serious comorbidity with body mass index (BMI) of 50.0 to 59.9 in adult, unspecified obesity type ([place])  Today Amitriptyline was started, start 1/2 tab and increase to a tab. No changes in Fluoxetine.   Please be sure medication list is accurate. If a new problem present, please set up appointment sooner than planned today.

## 2017-04-29 ENCOUNTER — Telehealth: Payer: Self-pay | Admitting: Family Medicine

## 2017-04-29 NOTE — Telephone Encounter (Signed)
Seen in 02/2017 for migraine and depression. Can you please find out the reason for surgery referral.  Thanks, BJ

## 2017-04-29 NOTE — Telephone Encounter (Signed)
Copied from [place] [id number]. Topic: Referral - Request >> Apr 29, 2017 12:42 PM Cecelia Byars, NT wrote: Reason for [id number] would like a referral to see Dr Marlou Starks on [address]  at [facility]  as soon as possible, please call patient at [phone]

## 2017-04-30 NOTE — Telephone Encounter (Signed)
Spoke with patient, patient stated that she has a hernia and would like for Dr. Judyann Munson to look at it because he did previous surgery on colon tear 3 years ago. Patient stated that she called their office and he said she needed a referral from her PCP.

## 2017-05-02 ENCOUNTER — Other Ambulatory Visit: Payer: Self-pay | Admitting: Family Medicine

## 2017-05-02 DIAGNOSIS — Z419 Encounter for procedure for purposes other than remedying health state, unspecified: Secondary | ICD-10-CM

## 2017-05-19 NOTE — Progress Notes (Signed)
HPI:   [name] [name] is a 36 y.o. female, who is here today to follow on recent OV.   She was seen on 02/18/17.  Depression Dx 36 years of age.  Fluoxetine 20 mg daily. She feels like medication is helping greatly. She has been able to deal well with stressful situations , does not get frustrated or become irritable as she used to.   She denies suicidal thoughts.  She has had some stress in the past 2 weeks. She found some compromising text messages in fiance's phone. Also dealing with financial issues.   + Insomnia last OV she was started on Amitriptyline 25 mg daily, 1/2 tab helps her sleep.  Sleeping about 7-8 hours. Tolerating medication well, no side effects noted.   Headache: Hx of migraines. Parietal-frontal-occipital headaches. "Not debilitating", dull and "annoyed pain" She has had 2 mild headaches since her last OV, when she was started on Amitriptyline.  New concern: 3 weeks ago she woke up with "mild" middle low back pain. Pain is not radiated and it is mild. Aleve helped.  +Numbness, constant, right buttocks,post aspect of thigh,groin area,and external genitalia.  No bowel or urine incontinence.    Review of Systems  Constitutional: Positive for fatigue. Negative for activity change, appetite change and fever.  HENT: Negative for mouth sores, nosebleeds and trouble swallowing.   Eyes: Negative for redness and visual disturbance.  Respiratory: Negative for cough, shortness of breath and wheezing.   Cardiovascular: Negative for chest pain, palpitations and leg swelling.  Gastrointestinal: Negative for abdominal pain, nausea and vomiting.       Negative for changes in bowel habits.  Genitourinary: Negative for decreased urine volume, dysuria and hematuria.  Musculoskeletal: Negative for gait problem and myalgias.  Skin: Negative for rash.  Neurological: Positive for numbness and headaches. Negative for syncope and weakness.    Psychiatric/Behavioral: Positive for sleep disturbance. Negative for confusion and suicidal ideas. The patient is nervous/anxious.       Current Outpatient Medications on File Prior to Visit  Medication Sig Dispense Refill  . amitriptyline (ELAVIL) 25 MG tablet Take 1 tablet (25 mg total) by mouth at bedtime. 30 tablet 2  . aspirin-acetaminophen-caffeine (EXCEDRIN MIGRAINE) 712-458-09 MG tablet Take 1 tablet by mouth every 6 (six) hours as needed for headache.    Marland Kitchen FLUoxetine (PROZAC) 20 MG capsule Take 1 capsule (20 mg total) by mouth daily. 90 capsule 1   No current facility-administered medications on file prior to visit.      Past Medical History:  Diagnosis Date  . Anxiety   . Asthma    childhood exercise induced only  . Cholecystitis chronic, acute   . Depression   . Gallstones   . Headache(784.0)    migraines  . Heart murmur    no indications for 7-53yrs per pt.   No Known Allergies  Social History   Socioeconomic History  . Marital status: Single    Spouse name: None  . Number of children: 3  . Years of education: None  . Highest education level: None  Social Needs  . Financial resource strain: None  . Food insecurity - worry: None  . Food insecurity - inability: None  . Transportation needs - medical: None  . Transportation needs - non-medical: None  Occupational History    Comment: [employer]-part time  Tobacco Use  . Smoking status: Never Smoker  . Smokeless tobacco: Never Used  Substance and Sexual Activity  . Alcohol  use: No  . Drug use: No  . Sexual activity: Yes    Partners: Male    Birth control/protection: None  Other Topics Concern  . None  Social History Narrative   Single, lives with significant other [name] [name])   #3 children: ages 13-11-2 1/2 months   Employer: [name]   Independent ADLs    Vitals:   05/20/17 0946  BP: 120/76  Pulse: 70  Resp: 12  Temp: 98.4 F (36.9 C)  SpO2: 98%   Body mass index is 48.58 kg/m.  Wt  Readings from Last 3 Encounters:  05/20/17 283 lb (128.4 kg)  02/18/17 293 lb 2 oz (133 kg)  01/07/17 294 lb (133.4 kg)    Physical Exam  Nursing note and vitals reviewed. Constitutional: She is oriented to person, place, and time. She appears well-developed. No distress.  HENT:  Head: Normocephalic and atraumatic.  Mouth/Throat: Oropharynx is clear and moist and mucous membranes are normal.  Eyes: Conjunctivae are normal. Pupils are equal, round, and reactive to light.  Cardiovascular: Normal rate and regular rhythm.  No murmur heard. Pulses:      Dorsalis pedis pulses are 2+ on the right side, and 2+ on the left side.  Respiratory: Effort normal and breath sounds normal. No respiratory distress.  GI: Soft. She exhibits no mass. There is no hepatomegaly. There is no tenderness.  Musculoskeletal: She exhibits no edema or tenderness.       Thoracic back: She exhibits no tenderness and no bony tenderness.       Lumbar back: She exhibits no tenderness and no bony tenderness.  Lymphadenopathy:    She has no cervical adenopathy.  Neurological: She is alert and oriented to person, place, and time. She has normal strength. Gait normal.  SLR negative bilateral.  Skin: Skin is warm. No rash noted. No erythema.  Psychiatric: Her mood appears anxious.  Well groomed, good eye contact.    ASSESSMENT AND PLAN:   Ms. Breanda was seen today for follow-up and right buttocks numbness.  Diagnoses and all orders for this visit:  Radiculopathy, sacral and sacrococcygeal region  After discussion of some side effects, she agrees with taking Prednisone. Instructed about warning signs. Further recommendations will be given after lumbar MRI results.  -     MR Lumbar Spine Wo Contrast; Future -     predniSONE (DELTASONE) 20 MG tablet; 3 tabs for 3 days, 2 tabs for 3 days, 1 tabs for 3 days, and 1/2 tab for 3 days. Take tables together with breakfast.  Insomnia, unspecified type  Good sleep  hygiene No changes in Amitriptyline 12.5 mg at bedtime. Some side effects discussed.  Chronic migraine without aura without status migrainosus, not intractable  Improved. No changes in Amitriptyline dose. F/U in 4 months.  Moderate major depression (HCC)  Improved. No changes in Fluoxetine to continue. Instructed about warning signs. F/u in 3-4 months.  Class 3 severe obesity without serious comorbidity with body mass index (BMI) of 45.0 to 49.9 in adult, unspecified obesity type ([place])  We discussed benefits of wt loss as well as adverse effects of obesity. Consistency with healthy diet and physical activity recommended. Brisk walking for 15-30 min as tolerated.    Anson Peddie G. Martinique, MD  [facility]. [name] office.

## 2017-05-20 ENCOUNTER — Encounter: Payer: Self-pay | Admitting: Family Medicine

## 2017-05-20 ENCOUNTER — Ambulatory Visit: Payer: Medicaid Other | Admitting: Family Medicine

## 2017-05-20 VITALS — BP 120/76 | HR 70 | Temp 98.4°F | Resp 12 | Ht 64.0 in | Wt 283.0 lb

## 2017-05-20 DIAGNOSIS — M5418 Radiculopathy, sacral and sacrococcygeal region: Secondary | ICD-10-CM | POA: Diagnosis not present

## 2017-05-20 DIAGNOSIS — G4700 Insomnia, unspecified: Secondary | ICD-10-CM

## 2017-05-20 DIAGNOSIS — G43709 Chronic migraine without aura, not intractable, without status migrainosus: Secondary | ICD-10-CM | POA: Diagnosis not present

## 2017-05-20 DIAGNOSIS — Z6841 Body Mass Index (BMI) 40.0 and over, adult: Secondary | ICD-10-CM | POA: Diagnosis not present

## 2017-05-20 DIAGNOSIS — F321 Major depressive disorder, single episode, moderate: Secondary | ICD-10-CM

## 2017-05-20 MED ORDER — PREDNISONE 20 MG PO TABS: ORAL_TABLET | ORAL | 0 refills | Status: AC

## 2017-05-20 NOTE — Patient Instructions (Signed)
A few things to remember from today's visit:   Chronic migraine without aura without status migrainosus, not intractable  Insomnia, unspecified type  Moderate major depression (HCC)  Radiculopathy, sacral and sacrococcygeal region - Plan: MR Lumbar Spine Wo Contrast, predniSONE (DELTASONE) 20 MG tablet  Prednisone with breakfast. Continue cooking at home. Daily walking for 15-30 min, brisk, recommended.   Please be sure medication list is accurate. If a new problem present, please set up appointment sooner than planned today.

## 2017-05-27 ENCOUNTER — Other Ambulatory Visit: Payer: Medicaid Other

## 2017-05-30 ENCOUNTER — Telehealth: Payer: Self-pay | Admitting: Family Medicine

## 2017-05-30 NOTE — Telephone Encounter (Signed)
Copied from [place] [phone]. Topic: Referral - Status >> May 30, 2017 12:17 PM Robina Ade, Helene Kelp D wrote: Reason for CRM: Patient needs to talk to provider or CMA about her MRI and rescheduling it since it was cancel when she got there. Please call patient back, thanks.

## 2017-06-05 NOTE — Telephone Encounter (Signed)
I did placed lumbar MRI during her visit, order is still active. If she needs to re-schedule imaging she can call and do so as her convenience.  Thanks, BJ

## 2017-06-05 NOTE — Telephone Encounter (Signed)
Left message letting patient know that she could reschedule on her own.

## 2017-06-12 ENCOUNTER — Ambulatory Visit: Payer: Self-pay | Admitting: General Surgery

## 2017-06-17 NOTE — Progress Notes (Signed)
HPI:   [name] is a 37 y.o. female, who is here today for her routine physical.  Last CPE: 3-4 years ago.  Regular exercise 3 or more time per week: Not consistent. Following a healthy diet: Yes,"in general." She lives with her fiance and 3 children.  Chronic medical problems: Migraine, depression, lower back pain, carcinoid tumor of appendix among some. Amitriptyline 25 mg has helped with migraines and insomnia.  Pap smear 3-4 years. Hx of abnormal pap smears: Denies. Hx of STD's: Denies. Hx of HPV during pregnancy.  G: 3 L: 3  Immunization History  Administered Date(s) Administered  . Influenza Split 01/06/2014  . Influenza,inj,Quad PF,6+ Mos 02/18/2017  . Rho (D) Immune Globulin 11/04/2012  . Td 08/08/2007    She would like to start birth control, she is interested in OCPs.    Review of Systems  Constitutional: Positive for fatigue. Negative for appetite change, fever and unexpected weight change.  HENT: Negative for dental problem, hearing loss, mouth sores, nosebleeds, sore throat, trouble swallowing and voice change.   Eyes: Negative for redness and visual disturbance.  Respiratory: Negative for cough, shortness of breath and wheezing.   Cardiovascular: Negative for chest pain, palpitations and leg swelling.  Gastrointestinal: Negative for abdominal pain, blood in stool, nausea and vomiting.       No changes in bowel habits.  Endocrine: Negative for cold intolerance, heat intolerance, polydipsia, polyphagia and polyuria.  Genitourinary: Negative for decreased urine volume, dysuria, hematuria, menstrual problem, vaginal bleeding and vaginal discharge.       No breast tenderness or nipple discharge.  Musculoskeletal: Negative for gait problem and myalgias.  Skin: Negative for color change and rash.  Allergic/Immunologic: Positive for environmental allergies.  Neurological: Positive for headaches (stable). Negative for syncope, weakness and  numbness.  Hematological: Negative for adenopathy. Does not bruise/bleed easily.  Psychiatric/Behavioral: Positive for sleep disturbance. Negative for confusion. The patient is nervous/anxious.   All other systems reviewed and are negative.     Current Outpatient Medications on File Prior to Visit  Medication Sig Dispense Refill  . amitriptyline (ELAVIL) 25 MG tablet Take 1 tablet (25 mg total) by mouth at bedtime. 30 tablet 2  . aspirin-acetaminophen-caffeine (EXCEDRIN MIGRAINE) 742-595-63 MG tablet Take 1 tablet by mouth every 6 (six) hours as needed for headache.    Marland Kitchen FLUoxetine (PROZAC) 20 MG capsule Take 1 capsule (20 mg total) by mouth daily. 90 capsule 1   No current facility-administered medications on file prior to visit.      Past Medical History:  Diagnosis Date  . Anxiety   . Asthma    childhood exercise induced only  . Cholecystitis chronic, acute   . Depression   . Gallstones   . Headache(784.0)    migraines  . Heart murmur    no indications for 7-34yrs per pt.    Past Surgical History:  Procedure Laterality Date  . APPENDECTOMY    . CESAREAN SECTION N/A 11/03/2012   Procedure: PRIMARY CESAREAN SECTION;  Surgeon: Melina Schools, MD;  Location: [facility];  Service: Obstetrics;  Laterality: N/A;  . CHOLECYSTECTOMY N/A 12/23/2012   Procedure: LAPAROSCOPIC CHOLECYSTECTOMY WITH INTRAOPERATIVE CHOLANGIOGRAM;  Surgeon: Pedro Earls, MD;  Location: [facility];  Service: General;  Laterality: N/A;  Laparoscopic Cholecystectomy with IOC  . ERCP N/A 12/22/2012   Procedure: ENDOSCOPIC RETROGRADE CHOLANGIOPANCREATOGRAPHY (ERCP);  Surgeon: Gatha Mayer, MD;  Location: [facility];  Service: Endoscopy;  Laterality: N/A;  . LAPAROTOMY  N/A 11/08/2012   Procedure: laparoscopic exploration and EXPLORATORY LAPAROTOMY;  Surgeon: Merrie Roof, MD;  Location: [facility];  Service: General;  Laterality: N/A;    No Known Allergies  Family History  Problem Relation Age of Onset  .  Cancer Father        Leukemia  . ADD / ADHD Unknown        family hx  . Breast cancer Unknown        fhx  . Mental illness Unknown        fhx  . Heart disease Unknown        fhx  . Hypertension Unknown        fhx    Social History   Socioeconomic History  . Marital status: Single    Spouse name: None  . Number of children: 3  . Years of education: None  . Highest education level: None  Social Needs  . Financial resource strain: None  . Food insecurity - worry: None  . Food insecurity - inability: None  . Transportation needs - medical: None  . Transportation needs - non-medical: None  Occupational History    Comment: [employer]-part time  Tobacco Use  . Smoking status: Never Smoker  . Smokeless tobacco: Never Used  Substance and Sexual Activity  . Alcohol use: No  . Drug use: No  . Sexual activity: Yes    Partners: Male    Birth control/protection: None  Other Topics Concern  . None  Social History Narrative   Single, lives with significant other [name])   #3 children: ages 13-11-2 1/2 months   Employer: [name]   Independent ADLs     Vitals:   06/18/17 0800  BP: 120/76  Pulse: 90  Resp: 12  Temp: 98.2 F (36.8 C)  SpO2: 98%   Body mass index is 49.99 kg/m.   Wt Readings from Last 3 Encounters:  06/18/17 291 lb 4 oz (132.1 kg)  05/20/17 283 lb (128.4 kg)  02/18/17 293 lb 2 oz (133 kg)      Physical Exam  Nursing note and vitals reviewed. Constitutional: She is oriented to person, place, and time. She appears well-developed. No distress.  HENT:  Head: Normocephalic and atraumatic.  Right Ear: Hearing, tympanic membrane, external ear and ear canal normal.  Left Ear: Hearing, tympanic membrane, external ear and ear canal normal.  Mouth/Throat: Uvula is midline, oropharynx is clear and moist and mucous membranes are normal.  Eyes: Conjunctivae and EOM are normal. Pupils are equal, round, and reactive to light.  Neck: No tracheal deviation  present. No thyromegaly present.  Cardiovascular: Normal rate and regular rhythm.  No murmur heard. Pulses:      Dorsalis pedis pulses are 2+ on the right side, and 2+ on the left side.       Posterior tibial pulses are 2+ on the right side, and 2+ on the left side.  Respiratory: Effort normal and breath sounds normal. No respiratory distress.  GI: Soft. She exhibits no mass. There is no hepatomegaly. There is no tenderness.  Genitourinary: There is no rash, tenderness or lesion on the right labia. There is no rash, tenderness or lesion on the left labia. Uterus is not enlarged and not tender. Cervix exhibits discharge. Cervix exhibits no motion tenderness and no friability. Right adnexum displays no mass, no tenderness and no fullness. Left adnexum displays no mass, no tenderness and no fullness. No erythema or bleeding in the vagina. Vaginal  discharge found.  Genitourinary Comments: Breast: No masses,skin changes,or nipple discharge bilateral. Pap collected.   Musculoskeletal: She exhibits no edema or tenderness.  No major deformity or signs of synovitis appreciated.  Lymphadenopathy:    She has no cervical adenopathy.    She has no axillary adenopathy.       Right: No inguinal and no supraclavicular adenopathy present.       Left: No inguinal and no supraclavicular adenopathy present.  Neurological: She is alert and oriented to person, place, and time. She has normal strength. No cranial nerve deficit. Coordination and gait normal.  Reflex Scores:      Bicep reflexes are 2+ on the right side and 2+ on the left side.      Patellar reflexes are 2+ on the right side and 2+ on the left side. Skin: Skin is warm. No rash noted. No erythema.  Psychiatric: She has a normal mood and affect. Cognition and memory are normal.  Well groomed, good eye contact.    ASSESSMENT AND PLAN:   Ms. Maicy was seen today for annual exam.  Diagnoses and all orders for this visit: Lab Results    Component Value Date   CHOL 190 06/18/2017   HDL 48.20 06/18/2017   LDLCALC 113 (H) 06/18/2017   TRIG 145.0 06/18/2017   CHOLHDL 4 06/18/2017   Lab Results  Component Value Date   CREATININE 0.56 06/18/2017   BUN 11 06/18/2017   NA 138 06/18/2017   K 4.2 06/18/2017   CL 104 06/18/2017   CO2 26 06/18/2017    Routine general medical examination at a health care facility  We discussed the importance of regular physical activity and healthy diet for prevention of chronic illness and/or complications. Preventive guidelines reviewed. She is not interested in STD test, denies risk factors for STD's. Vaccination up to date, Tdap due in 08/2017.  Next CPE in a year.   Cervical cancer screening -     Cytology - PAP ()  Diabetes mellitus screening -     Basic metabolic panel  Screening for lipid disorders -     Lipid panel  Encounter for initial prescription of contraceptive pills  After discussion of a few options and side effects,including thrombotic events,mood changes, GI symptoms,and liver hyperplasia; she agrees with trying Ortho Cyclen.  -     norgestimate-ethinyl estradiol (ORTHO-CYCLEN, 28,) 0.25-35 MG-MCG tablet; Take 1 tablet by mouth daily.       Return in 5 months (on 11/16/2017) for insomnia,depression.          Betty G. Martinique, MD  [facility]. [place] office.

## 2017-06-18 ENCOUNTER — Encounter: Payer: Self-pay | Admitting: Family Medicine

## 2017-06-18 ENCOUNTER — Other Ambulatory Visit (HOSPITAL_COMMUNITY)
Admission: RE | Admit: 2017-06-18 | Discharge: 2017-06-18 | Disposition: A | Discharge status: Home / Self Care | Payer: Medicaid Other | Source: Ambulatory Visit | Attending: Family Medicine | Admitting: Family Medicine

## 2017-06-18 ENCOUNTER — Ambulatory Visit (INDEPENDENT_AMBULATORY_CARE_PROVIDER_SITE_OTHER): Payer: Medicaid Other | Admitting: Family Medicine

## 2017-06-18 VITALS — BP 120/76 | HR 90 | Temp 98.2°F | Resp 12 | Ht 64.0 in | Wt 291.2 lb

## 2017-06-18 DIAGNOSIS — Z131 Encounter for screening for diabetes mellitus: Secondary | ICD-10-CM | POA: Diagnosis not present

## 2017-06-18 DIAGNOSIS — Z13220 Encounter for screening for lipoid disorders: Secondary | ICD-10-CM | POA: Diagnosis not present

## 2017-06-18 DIAGNOSIS — D069 Carcinoma in situ of cervix, unspecified: Secondary | ICD-10-CM | POA: Insufficient documentation

## 2017-06-18 DIAGNOSIS — Z124 Encounter for screening for malignant neoplasm of cervix: Secondary | ICD-10-CM | POA: Diagnosis not present

## 2017-06-18 DIAGNOSIS — Z0000 Encounter for general adult medical examination without abnormal findings: Secondary | ICD-10-CM | POA: Diagnosis not present

## 2017-06-18 DIAGNOSIS — Z30011 Encounter for initial prescription of contraceptive pills: Secondary | ICD-10-CM

## 2017-06-18 LAB — BASIC METABOLIC PANEL
BUN: 11 mg/dL (ref 6–23)
CALCIUM: 8.7 mg/dL (ref 8.4–10.5)
CO2: 26 mEq/L (ref 19–32)
Chloride: 104 mEq/L (ref 96–112)
Creatinine, Ser: 0.56 mg/dL (ref 0.40–1.20)
GFR: 129.85 mL/min (ref >60.00)
GLUCOSE: 92 mg/dL (ref 70–99)
Potassium: 4.2 mEq/L (ref 3.5–5.1)
SODIUM: 138 meq/L (ref 135–145)

## 2017-06-18 LAB — LIPID PANEL
Cholesterol: 190 mg/dL (ref 0–200)
HDL: 48.2 mg/dL (ref >39.00)
LDL Cholesterol: 113 mg/dL — ABNORMAL HIGH (ref 0–99)
NONHDL: 142.26
Total CHOL/HDL Ratio: 4
Triglycerides: 145 mg/dL (ref 0.0–149.0)
VLDL: 29 mg/dL (ref 0.0–40.0)

## 2017-06-18 MED ORDER — NORGESTIMATE-ETH ESTRADIOL 0.25-35 MG-MCG PO TABS: 1.0000 | ORAL_TABLET | Freq: Every day | ORAL | 3 refills | Status: DC

## 2017-06-18 NOTE — Patient Instructions (Addendum)
A few things to remember from today's visit:   Routine general medical examination at a health care facility  Cervical cancer screening - Plan: Cytology - PAP ([place])  Diabetes mellitus screening - Plan: Basic metabolic panel  Screening for lipid disorders - Plan: Lipid panel  Encounter for initial prescription of contraceptive pills - Plan: norgestimate-ethinyl estradiol (ORTHO-CYCLEN, 28,) 0.25-35 MG-MCG tablet  Today you have you routine preventive visit.  At least 150 minutes of moderate exercise per week, daily brisk walking for 15-30 min is a good exercise option. Healthy diet low in saturated (animal) fats and sweets and consisting of fresh fruits and vegetables, lean meats such as fish and white chicken and whole grains.  These are some of recommendations for screening depending of age and risk factors:   - Vaccines:  Tdap vaccine every 10 years.  Shingles vaccine recommended at age 32, could be given after 37 years of age but not sure about insurance coverage.   Pneumonia vaccines:  Prevnar 13 at 65 and Pneumovax at 38. Sometimes Pneumovax is giving earlier if history of smoking, lung disease,diabetes,kidney disease among some.    Screening for diabetes at age 96 and every 3 years.  Cervical cancer prevention:  Pap smear starts at 37 years of age and continues periodically until 37 years old in low risk women. Pap smear every 3 years between 62 and 75 years old. Pap smear every 3-5 years between women 49 and older if pap smear negative and HPV screening negative.   -Breast cancer: Mammogram: There is disagreement between experts about when to start screening in low risk asymptomatic female but recent recommendations are to start screening at 21 and not later than 37 years old , every 1-2 years and after 37 yo q 2 years. Screening is recommended until 37 years old but some women can continue screening depending of healthy issues.   Colon cancer screening: starts  at 37 years old until 37 years old.  Cholesterol disorder screening at age 63 and every 3 years.  Also recommended:  1. Dental visit- Brush and floss your teeth twice daily; visit your dentist twice a year. 2. Eye doctor- Get an eye exam at least every 2 years. 3. Helmet use- Always wear a helmet when riding a bicycle, motorcycle, rollerblading or skateboarding. 4. Safe sex- If you may be exposed to sexually transmitted infections, use a condom. 5. Seat belts- Seat belts can save your live; always wear one. 6. Smoke/Carbon Monoxide detectors- These detectors need to be installed on the appropriate level of your home. Replace batteries at least once a year. 7. Skin cancer- When out in the sun please cover up and use sunscreen 15 SPF or higher. 8. Violence- If anyone is threatening or hurting you, please tell your healthcare provider.  9. Drink alcohol in moderation- Limit alcohol intake to one drink or less per day. Never drink and drive.   Oral Contraception Information Oral contraceptive pills (OCPs) are medicines taken to prevent pregnancy. OCPs work by preventing the ovaries from releasing eggs. The hormones in OCPs also cause the cervical mucus to thicken, preventing the sperm from entering the uterus. The hormones also cause the uterine lining to become thin, not allowing a fertilized egg to attach to the inside of the uterus. OCPs are highly effective when taken exactly as prescribed. However, OCPs do not prevent sexually transmitted diseases (STDs). Safe sex practices, such as using condoms along with the pill, can help prevent STDs. Before taking the pill,  you may have a physical exam and Pap test. Your health care provider may order blood tests. The health care provider will make sure you are a good candidate for oral contraception. Discuss with your health care provider the possible side effects of the OCP you may be prescribed. When starting an OCP, it can take 2 to 3 months for the  body to adjust to the changes in hormone levels in your body. Types of oral contraception  The combination pill-This pill contains estrogen and progestin (synthetic progesterone) hormones. The combination pill comes in 21-day, 28-day, or 91-day packs. Some types of combination pills are meant to be taken continuously (365-day pills). With 21-day packs, you do not take pills for 7 days after the last pill. With 28-day packs, the pill is taken every day. The last 7 pills are without hormones. Certain types of pills have more than 21 hormone-containing pills. With 91-day packs, the first 84 pills contain both hormones, and the last 7 pills contain no hormones or contain estrogen only.  The minipill-This pill contains the progesterone hormone only. The pill is taken every day continuously. It is very important to take the pill at the same time each day. The minipill comes in packs of 28 pills. All 28 pills contain the hormone. Advantages of oral contraceptive pills  Decreases premenstrual symptoms.  Treats menstrual period cramps.  Regulates the menstrual cycle.  Decreases a heavy menstrual flow.  May treatacne, depending on the type of pill.  Treats abnormal uterine bleeding.  Treats polycystic ovarian syndrome.  Treats endometriosis.  Can be used as emergency contraception. Things that can make oral contraceptive pills less effective OCPs can be less effective if:  You forget to take the pill at the same time every day.  You have a stomach or intestinal disease that lessens the absorption of the pill.  You take OCPs with other medicines that make OCPs less effective, such as antibiotics, certain HIV medicines, and some seizure medicines.  You take expired OCPs.  You forget to restart the pill on day 7, when using the packs of 21 pills.  Risks associated with oral contraceptive pills Oral contraceptive pills can sometimes cause side effects, such  as:  Headache.  Nausea.  Breast tenderness.  Irregular bleeding or spotting.  Combination pills are also associated with a small increased risk of:  Blood clots.  Heart attack.  Stroke.  This information is not intended to replace advice given to you by your health care provider. Make sure you discuss any questions you have with your health care provider. Document Released: 08/11/2002 Document Revised: 10/27/2015 Document Reviewed: 11/09/2012 Elsevier Interactive Patient Education  2018 [employer].  Please be sure medication list is accurate. If a new problem present, please set up appointment sooner than planned today.

## 2017-06-20 LAB — CYTOLOGY - PAP
DIAGNOSIS: HIGH — AB
HPV: DETECTED — AB

## 2017-06-21 ENCOUNTER — Other Ambulatory Visit: Payer: Self-pay | Admitting: Family Medicine

## 2017-06-21 DIAGNOSIS — R87613 High grade squamous intraepithelial lesion on cytologic smear of cervix (HGSIL): Secondary | ICD-10-CM

## 2017-07-10 ENCOUNTER — Encounter (HOSPITAL_COMMUNITY): Payer: Self-pay

## 2017-07-11 ENCOUNTER — Encounter (HOSPITAL_COMMUNITY)
Admission: RE | Admit: 2017-07-11 | Discharge: 2017-07-11 | Disposition: A | Discharge status: Home / Self Care | Payer: Medicaid Other | Source: Ambulatory Visit | Attending: General Surgery | Admitting: General Surgery

## 2017-07-11 ENCOUNTER — Encounter (HOSPITAL_COMMUNITY): Payer: Self-pay

## 2017-07-11 ENCOUNTER — Other Ambulatory Visit: Payer: Self-pay

## 2017-07-11 DIAGNOSIS — Z79899 Other long term (current) drug therapy: Secondary | ICD-10-CM | POA: Insufficient documentation

## 2017-07-11 DIAGNOSIS — Z8679 Personal history of other diseases of the circulatory system: Secondary | ICD-10-CM | POA: Insufficient documentation

## 2017-07-11 DIAGNOSIS — F329 Major depressive disorder, single episode, unspecified: Secondary | ICD-10-CM | POA: Diagnosis not present

## 2017-07-11 DIAGNOSIS — Z01812 Encounter for preprocedural laboratory examination: Secondary | ICD-10-CM | POA: Diagnosis not present

## 2017-07-11 DIAGNOSIS — Z7982 Long term (current) use of aspirin: Secondary | ICD-10-CM | POA: Insufficient documentation

## 2017-07-11 DIAGNOSIS — R51 Headache: Secondary | ICD-10-CM | POA: Insufficient documentation

## 2017-07-11 DIAGNOSIS — Z01810 Encounter for preprocedural cardiovascular examination: Secondary | ICD-10-CM | POA: Insufficient documentation

## 2017-07-11 HISTORY — DX: Malignant (primary) neoplasm, unspecified: C80.1

## 2017-07-11 LAB — CBC
HCT: 39.3 % (ref 36.0–46.0)
HEMOGLOBIN: 12.8 g/dL (ref 12.0–15.0)
MCH: 29.2 pg (ref 26.0–34.0)
MCHC: 32.6 g/dL (ref 30.0–36.0)
MCV: 89.7 fL (ref 78.0–100.0)
Platelets: 272 10*3/uL (ref 150–400)
RBC: 4.38 MIL/uL (ref 3.87–5.11)
RDW: 14.7 % (ref 11.5–15.5)
WBC: 6.9 10*3/uL (ref 4.0–10.5)

## 2017-07-11 LAB — BASIC METABOLIC PANEL
ANION GAP: 12 (ref 5–15)
BUN: 11 mg/dL (ref 6–20)
CALCIUM: 9.3 mg/dL (ref 8.9–10.3)
CHLORIDE: 106 mmol/L (ref 101–111)
CO2: 20 mmol/L — AB (ref 22–32)
Creatinine, Ser: 0.67 mg/dL (ref 0.44–1.00)
GFR calc non Af Amer: >60 mL/min (ref >60)
GLUCOSE: 104 mg/dL — AB (ref 65–99)
Potassium: 4.3 mmol/L (ref 3.5–5.1)
Sodium: 138 mmol/L (ref 135–145)

## 2017-07-11 NOTE — Pre-Procedure Instructions (Signed)
Kaylah Chiasson  07/11/2017      [place], [place] - [name] [place] [address] Phone: [phone] Fax: [phone]    Your procedure is scheduled on 07/15/2017.  Report to [facility] Admitting at [place].M.  Call this number if you have problems the morning of surgery:  [phone]   Remember:  Do not eat food or drink liquids after midnight.  Take these medicines the morning of surgery with A SIP OF WATER: Norgestimate-ethinyl estradiol (Ortho-cyclen 28)  7 days prior to surgery STOP taking any Aspirin (unless otherwise instructed by your surgeon), Aleve, Naproxen, Ibuprofen, Motrin, Advil, Goody's, BC's, all herbal medications, fish oil, and all vitamins     Do not wear jewelry, make-up or nail polish.  Do not wear lotions, powders, or perfumes, or deodorant.  Do not shave 48 hours prior to surgery.    Do not bring valuables to the hospital.  [facility] is not responsible for any belongings or valuables.  Hearing aids, eyeglasses, contacts, dentures or bridgework may not be worn into surgery.  Leave your suitcase in the car.  After surgery it may be brought to your room.  For patients admitted to the hospital, discharge time will be determined by your treatment team.  Patients discharged the day of surgery will not be allowed to drive home.   Name and phone number of your driver:    Special instructions:   [facility]- Preparing For Surgery  Before surgery, you can play an important role. Because skin is not sterile, your skin needs to be as free of germs as possible. You can reduce the number of germs on your skin by washing with CHG (chlorahexidine gluconate) Soap before surgery.  CHG is an antiseptic cleaner which kills germs and bonds with the skin to continue killing germs even after washing.  Please do not use if you have an allergy to CHG or antibacterial soaps. If your skin becomes  reddened/irritated stop using the CHG.  Do not shave (including legs and underarms) for at least 48 hours prior to first CHG shower. It is OK to shave your face.  Please follow these instructions carefully.   1. Shower the NIGHT BEFORE SURGERY and the MORNING OF SURGERY with CHG.   2. If you chose to wash your hair, wash your hair first as usual with your normal shampoo.  3. After you shampoo, rinse your hair and body thoroughly to remove the shampoo.  4. Use CHG as you would any other liquid soap. You can apply CHG directly to the skin and wash gently with a scrungie or a clean washcloth.   5. Apply the CHG Soap to your body ONLY FROM THE NECK DOWN.  Do not use on open wounds or open sores. Avoid contact with your eyes, ears, mouth and genitals (private parts). Wash Face and genitals (private parts)  with your normal soap.  6. Wash thoroughly, paying special attention to the area where your surgery will be performed.  7. Thoroughly rinse your body with warm water from the neck down.  8. DO NOT shower/wash with your normal soap after using and rinsing off the CHG Soap.  9. Pat yourself dry with a CLEAN TOWEL.  10. Wear CLEAN PAJAMAS to bed the night before surgery, wear comfortable clothes the morning of surgery  11. Place CLEAN SHEETS on your bed the night of your first shower and DO NOT SLEEP  WITH PETS.    Day of Surgery: Shower as stated above. Do not apply any deodorants/lotions.  Please wear clean clothes to the hospital/surgery center.      Please read over the following fact sheets that you were given.

## 2017-07-11 NOTE — Pre-Procedure Instructions (Signed)
[name] [name]  07/11/2017      [place], [place] - [address] [name] [address] Phone: [phone] Fax: [phone]    Your procedure is scheduled on 07/15/2017.  Report to [facility] Admitting at [place].M.  Call this number if you have problems the morning of surgery:  [phone]   Remember:  Do not eat food or drink liquids after midnight.  **Please complete your PRE-SURGERY ENSURE that was provided before you leave your house the morning of surgery.  Please, if able, drink it in one setting. DO NOT SIP.**   Take these medicines the morning of surgery with A SIP OF WATER:  Norgestimate-ethinyl estradiol (Ortho-cyclen 28)  7 days prior to surgery STOP taking any Aspirin (unless otherwise instructed by your surgeon), Aleve, Naproxen, Ibuprofen, Motrin, Advil, Goody's, BC's, all herbal medications, fish oil, and all vitamins     Do not wear jewelry, make-up or nail polish.  Do not wear lotions, powders, or perfumes, or deodorant.  Do not shave 48 hours prior to surgery.    Do not bring valuables to the hospital.  [facility] is not responsible for any belongings or valuables.  Hearing aids, eyeglasses, contacts, dentures or bridgework may not be worn into surgery.  Leave your suitcase in the car.  After surgery it may be brought to your room.  For patients admitted to the hospital, discharge time will be determined by your treatment team.  Patients discharged the day of surgery will not be allowed to drive home.   Name and phone number of your driver:    Special instructions:   [place]- Preparing For Surgery  Before surgery, you can play an important role. Because skin is not sterile, your skin needs to be as free of germs as possible. You can reduce the number of germs on your skin by washing with CHG (chlorahexidine gluconate) Soap before surgery.  CHG is an antiseptic cleaner which  kills germs and bonds with the skin to continue killing germs even after washing.  Please do not use if you have an allergy to CHG or antibacterial soaps. If your skin becomes reddened/irritated stop using the CHG.  Do not shave (including legs and underarms) for at least 48 hours prior to first CHG shower. It is OK to shave your face.  Please follow these instructions carefully.   1. Shower the NIGHT BEFORE SURGERY and the MORNING OF SURGERY with CHG.   2. If you chose to wash your hair, wash your hair first as usual with your normal shampoo.  3. After you shampoo, rinse your hair and body thoroughly to remove the shampoo.  4. Use CHG as you would any other liquid soap. You can apply CHG directly to the skin and wash gently with a scrungie or a clean washcloth.   5. Apply the CHG Soap to your body ONLY FROM THE NECK DOWN.  Do not use on open wounds or open sores. Avoid contact with your eyes, ears, mouth and genitals (private parts). Wash Face and genitals (private parts)  with your normal soap.  6. Wash thoroughly, paying special attention to the area where your surgery will be performed.  7. Thoroughly rinse your body with warm water from the neck down.  8. DO NOT shower/wash with your normal soap after using and rinsing off the CHG Soap.  9. Pat yourself dry with a CLEAN TOWEL.  10. Wear CLEAN  PAJAMAS to bed the night before surgery, wear comfortable clothes the morning of surgery  11. Place CLEAN SHEETS on your bed the night of your first shower and DO NOT SLEEP WITH PETS.    Day of Surgery: Shower as stated above. Do not apply any deodorants/lotions.  Please wear clean clothes to the hospital/surgery center.      Please read over the following fact sheets that you were given.

## 2017-07-11 NOTE — Progress Notes (Signed)
PCP: Dr. Charlsie Merles 3 weeks ago for routine visit Cardiologist: Denies--saw a cardiologist 4 years ago, can't remember who.  Saw to follow-up on a heart murmur, pt reports cardiologist "could barely hear murmur" and no follow up testing needed  EKG: Today ECHO: Denies Stress Test: Denies Cardiac Cath: Denies  ERAS protocol--pre ensure drink provided, pt verbalized understanding when to consume  Pt reports being VERY anxious about surgery. Reports last time she had surgery, she was in the hospital for 3 months.  Pt informed she would need to provide urine sample DOS for urine preg test.  Patient denies shortness of breath, fever, cough, and chest pain at PAT appointment.  Patient verbalized understanding of instructions provided today at the PAT appointment.  Patient asked to review instructions at home and day of surgery.

## 2017-07-12 ENCOUNTER — Telehealth: Payer: Self-pay | Admitting: Family Medicine

## 2017-07-12 MED ORDER — GABAPENTIN 300 MG PO CAPS
300.0000 mg | ORAL_CAPSULE | ORAL | Status: AC
  Administered 2017-07-15: 300 mg via ORAL
  Filled 2017-07-12: qty 1

## 2017-07-12 MED ORDER — DEXTROSE 5 % IV SOLN
3.0000 g | INTRAVENOUS | Status: AC
  Administered 2017-07-15: 3 g via INTRAVENOUS
  Filled 2017-07-12: qty 3000

## 2017-07-12 MED ORDER — ACETAMINOPHEN 500 MG PO TABS
1000.0000 mg | ORAL_TABLET | ORAL | Status: AC
  Administered 2017-07-15: 1000 mg via ORAL
  Filled 2017-07-12: qty 2

## 2017-07-12 MED ORDER — CELECOXIB 200 MG PO CAPS
200.0000 mg | ORAL_CAPSULE | ORAL | Status: AC
  Administered 2017-07-15: 200 mg via ORAL
  Filled 2017-07-12: qty 1

## 2017-07-12 NOTE — Telephone Encounter (Signed)
Copied from [place]. Topic: General - Other >> Jul 12, 2017 10:58 AM [name] [name] wrote: Patient is having a hernia surgery on Monday, she is suppose to start her period pill of her birthcontrol. Is it ok to skip and start another packet

## 2017-07-12 NOTE — Progress Notes (Signed)
Anesthesia Chart Review: Patient is a 37 year old female scheduled for laparoscopic assisted ventral hernia repair on 07/15/17 by Dr. Autumn Messing.   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   I called to clarify murmur history with patient (anesthesia APP was not consulted while patient in PAT). She said that around age 71-25 year old, she was having wheezing with exercise. She had an echo showing a "pin hole" in her heart. She reports she was told no intervention was needed and no restrictions. At one point she was told she needed pre-procedure antibiotics, but within the past 10 years was told it was no longer needed. She did not recognize that terms ventricular septal defect, atrial septal defect, of patent foramen ovale. She denied SOB. She is able to walk on the treadmill for 60-90 minutes, some on an incline and does not get exertional chest pain. She does have some DOE with exercise, but no change/progression. No new edema. No syncope. She is not sure where or when she had her last echo, but thought she had one during one of her pregnancies. (In review of notes, I see some providers saying she reported a "pin hole VSD" and other "pulmonic stenosis.")   - PCP is Dr. Betty Martinique. Last visit 06/18/17 for routine physical. No murmur heard by notes.  - HEM-ONC is Dr. Betsy Coder. 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    Meds include amitriptyline, Excedrine Migraine, Prozac, Orth-Cyclen.  BP 116/74   Pulse 77   Temp 36.9 C   Resp 20   Ht 5\' 4"  (1.626 m)   Wt 288 lb 9.6 oz (130.9 kg)   SpO2 95%   BMI 49.54 kg/m   EKG 07/11/17: NSR, low voltage QRS.   Preoperative labs noted. Cr 0.67, glucose 104, CBC WNL. She is for a urine pregnancy test on the day of  surgery.   Patient with murmur history, although specifics unclear as discussed above. No murmur documented at recent PCP visit. She appears overall asymptomatic. Patient able to walk on treadmill for up to 90 minutes. She has chronic DOE, but no chest pain or syncope. Based on currently available information, I would anticipate that she can proceed as planned. I discussed this as well with anesthesiologist Dr. Albertha Ghee.  [name] [name] [facility]/Anesthesiology Phone [phone] 07/12/2017 1:13 PM

## 2017-07-12 NOTE — Telephone Encounter (Signed)
Left message to give clinic a call concerning medication. Per Dr. Martinique she does not have to stop taking pill, but if she does then she may have some spotting.

## 2017-07-15 ENCOUNTER — Inpatient Hospital Stay (HOSPITAL_COMMUNITY)
Admission: RE | Admit: 2017-07-15 | Discharge: 2017-07-18 | DRG: 354 | Disposition: A | Discharge status: Home / Self Care | Payer: Medicaid Other | Source: Ambulatory Visit | Attending: General Surgery | Admitting: General Surgery

## 2017-07-15 ENCOUNTER — Ambulatory Visit (HOSPITAL_COMMUNITY): Payer: Medicaid Other | Admitting: Vascular Surgery

## 2017-07-15 ENCOUNTER — Ambulatory Visit (HOSPITAL_COMMUNITY): Payer: Medicaid Other | Admitting: Anesthesiology

## 2017-07-15 ENCOUNTER — Encounter (HOSPITAL_COMMUNITY): Payer: Self-pay | Admitting: Anesthesiology

## 2017-07-15 ENCOUNTER — Encounter (HOSPITAL_COMMUNITY)
Admission: RE | Disposition: A | Discharge status: Home / Self Care | Payer: Self-pay | Source: Ambulatory Visit | Attending: General Surgery

## 2017-07-15 ENCOUNTER — Other Ambulatory Visit: Payer: Self-pay

## 2017-07-15 DIAGNOSIS — F418 Other specified anxiety disorders: Secondary | ICD-10-CM | POA: Diagnosis present

## 2017-07-15 DIAGNOSIS — K439 Ventral hernia without obstruction or gangrene: Principal | ICD-10-CM | POA: Diagnosis present

## 2017-07-15 DIAGNOSIS — Z6841 Body Mass Index (BMI) 40.0 and over, adult: Secondary | ICD-10-CM

## 2017-07-15 DIAGNOSIS — F419 Anxiety disorder, unspecified: Secondary | ICD-10-CM | POA: Diagnosis present

## 2017-07-15 DIAGNOSIS — R112 Nausea with vomiting, unspecified: Secondary | ICD-10-CM | POA: Diagnosis not present

## 2017-07-15 HISTORY — PX: VENTRAL HERNIA REPAIR: SHX424

## 2017-07-15 HISTORY — PX: INSERTION OF MESH: SHX5868

## 2017-07-15 LAB — POCT PREGNANCY, URINE: Preg Test, Ur: NEGATIVE

## 2017-07-15 SURGERY — REPAIR, HERNIA, VENTRAL, LAPAROSCOPIC
Anesthesia: General | Site: Abdomen

## 2017-07-15 MED ORDER — FENTANYL CITRATE (PF) 100 MCG/2ML IJ SOLN
INTRAMUSCULAR | Status: DC | PRN
  Administered 2017-07-15 (×7): 50 ug via INTRAVENOUS
  Administered 2017-07-15: 100 ug via INTRAVENOUS
  Administered 2017-07-15: 50 ug via INTRAVENOUS

## 2017-07-15 MED ORDER — ROCURONIUM BROMIDE 10 MG/ML (PF) SYRINGE
PREFILLED_SYRINGE | INTRAVENOUS | Status: AC
  Filled 2017-07-15: qty 5

## 2017-07-15 MED ORDER — METHOCARBAMOL 500 MG PO TABS
500.0000 mg | ORAL_TABLET | Freq: Four times a day (QID) | ORAL | Status: DC | PRN
  Administered 2017-07-15 – 2017-07-18 (×6): 500 mg via ORAL
  Filled 2017-07-15 (×6): qty 1

## 2017-07-15 MED ORDER — HYDROMORPHONE HCL 1 MG/ML IJ SOLN
INTRAMUSCULAR | Status: AC
  Administered 2017-07-15: 0.5 mg via INTRAVENOUS
  Filled 2017-07-15: qty 1

## 2017-07-15 MED ORDER — MEPERIDINE HCL 50 MG/ML IJ SOLN: 6.2500 mg | INTRAMUSCULAR | Status: DC | PRN

## 2017-07-15 MED ORDER — HYDROMORPHONE HCL 1 MG/ML IJ SOLN
0.2500 mg | INTRAMUSCULAR | Status: DC | PRN
  Administered 2017-07-15 (×4): 0.5 mg via INTRAVENOUS

## 2017-07-15 MED ORDER — PROPOFOL 10 MG/ML IV BOLUS
INTRAVENOUS | Status: AC
  Filled 2017-07-15: qty 40

## 2017-07-15 MED ORDER — ONDANSETRON HCL 4 MG/2ML IJ SOLN
INTRAMUSCULAR | Status: AC
  Filled 2017-07-15: qty 2

## 2017-07-15 MED ORDER — ROCURONIUM BROMIDE 10 MG/ML (PF) SYRINGE
PREFILLED_SYRINGE | INTRAVENOUS | Status: DC | PRN
  Administered 2017-07-15 (×2): 50 mg via INTRAVENOUS
  Administered 2017-07-15: 30 mg via INTRAVENOUS

## 2017-07-15 MED ORDER — FENTANYL CITRATE (PF) 250 MCG/5ML IJ SOLN
INTRAMUSCULAR | Status: AC
  Filled 2017-07-15: qty 5

## 2017-07-15 MED ORDER — PROMETHAZINE HCL 25 MG/ML IJ SOLN: 6.2500 mg | INTRAMUSCULAR | Status: DC | PRN

## 2017-07-15 MED ORDER — PANTOPRAZOLE SODIUM 40 MG IV SOLR
40.0000 mg | Freq: Every day | INTRAVENOUS | Status: DC
  Administered 2017-07-15 – 2017-07-16 (×2): 40 mg via INTRAVENOUS
  Filled 2017-07-15 (×2): qty 40

## 2017-07-15 MED ORDER — KCL IN DEXTROSE-NACL 20-5-0.9 MEQ/L-%-% IV SOLN
INTRAVENOUS | Status: DC
  Administered 2017-07-15 – 2017-07-18 (×4): via INTRAVENOUS
  Filled 2017-07-15 (×5): qty 1000

## 2017-07-15 MED ORDER — MORPHINE SULFATE (PF) 4 MG/ML IV SOLN
1.0000 mg | INTRAVENOUS | Status: DC | PRN
  Administered 2017-07-15: 2 mg via INTRAVENOUS
  Filled 2017-07-15: qty 1

## 2017-07-15 MED ORDER — HEPARIN SODIUM (PORCINE) 5000 UNIT/ML IJ SOLN
5000.0000 [IU] | Freq: Three times a day (TID) | INTRAMUSCULAR | Status: DC
  Administered 2017-07-16 – 2017-07-18 (×8): 5000 [IU] via SUBCUTANEOUS
  Filled 2017-07-15 (×8): qty 1

## 2017-07-15 MED ORDER — MIDAZOLAM HCL 5 MG/5ML IJ SOLN
INTRAMUSCULAR | Status: DC | PRN
  Administered 2017-07-15: 2 mg via INTRAVENOUS

## 2017-07-15 MED ORDER — DEXAMETHASONE SODIUM PHOSPHATE 10 MG/ML IJ SOLN
INTRAMUSCULAR | Status: DC | PRN
  Administered 2017-07-15: 10 mg via INTRAVENOUS

## 2017-07-15 MED ORDER — ONDANSETRON HCL 4 MG/2ML IJ SOLN
INTRAMUSCULAR | Status: DC | PRN
  Administered 2017-07-15: 4 mg via INTRAVENOUS

## 2017-07-15 MED ORDER — PHENYLEPHRINE 40 MCG/ML (10ML) SYRINGE FOR IV PUSH (FOR BLOOD PRESSURE SUPPORT)
PREFILLED_SYRINGE | INTRAVENOUS | Status: AC
  Filled 2017-07-15: qty 10

## 2017-07-15 MED ORDER — OXYCODONE HCL 5 MG/5ML PO SOLN: 5.0000 mg | Freq: Once | ORAL | Status: DC | PRN

## 2017-07-15 MED ORDER — PHENYLEPHRINE 40 MCG/ML (10ML) SYRINGE FOR IV PUSH (FOR BLOOD PRESSURE SUPPORT)
PREFILLED_SYRINGE | INTRAVENOUS | Status: DC | PRN
  Administered 2017-07-15 (×2): 40 ug via INTRAVENOUS

## 2017-07-15 MED ORDER — OXYCODONE HCL 5 MG PO TABS: 5.0000 mg | ORAL_TABLET | Freq: Once | ORAL | Status: DC | PRN

## 2017-07-15 MED ORDER — SUGAMMADEX SODIUM 200 MG/2ML IV SOLN
INTRAVENOUS | Status: DC | PRN
  Administered 2017-07-15: 400 mg via INTRAVENOUS

## 2017-07-15 MED ORDER — LACTATED RINGERS IV SOLN
INTRAVENOUS | Status: DC
  Administered 2017-07-15 (×2): via INTRAVENOUS

## 2017-07-15 MED ORDER — SUGAMMADEX SODIUM 500 MG/5ML IV SOLN
INTRAVENOUS | Status: AC
  Filled 2017-07-15: qty 5

## 2017-07-15 MED ORDER — PROMETHAZINE HCL 25 MG/ML IJ SOLN
12.5000 mg | Freq: Four times a day (QID) | INTRAMUSCULAR | Status: DC | PRN
  Administered 2017-07-15 – 2017-07-17 (×2): 12.5 mg via INTRAVENOUS
  Filled 2017-07-15 (×2): qty 1

## 2017-07-15 MED ORDER — CHLORHEXIDINE GLUCONATE CLOTH 2 % EX PADS: 6.0000 | MEDICATED_PAD | Freq: Once | CUTANEOUS | Status: DC

## 2017-07-15 MED ORDER — PROPOFOL 10 MG/ML IV BOLUS
INTRAVENOUS | Status: DC | PRN
  Administered 2017-07-15: 150 mg via INTRAVENOUS

## 2017-07-15 MED ORDER — DEXAMETHASONE SODIUM PHOSPHATE 10 MG/ML IJ SOLN
INTRAMUSCULAR | Status: AC
  Filled 2017-07-15: qty 1

## 2017-07-15 MED ORDER — FENTANYL CITRATE (PF) 250 MCG/5ML IJ SOLN
INTRAMUSCULAR | Status: AC
Start: 2017-07-15 — End: ?
  Filled 2017-07-15: qty 5

## 2017-07-15 MED ORDER — HYDROCODONE-ACETAMINOPHEN 5-325 MG PO TABS
1.0000 | ORAL_TABLET | ORAL | Status: DC | PRN
  Administered 2017-07-15 – 2017-07-16 (×3): 2 via ORAL
  Filled 2017-07-15 (×3): qty 2

## 2017-07-15 MED ORDER — FLUOXETINE HCL 20 MG PO CAPS
20.0000 mg | ORAL_CAPSULE | Freq: Every day | ORAL | Status: DC
  Administered 2017-07-15 – 2017-07-18 (×4): 20 mg via ORAL
  Filled 2017-07-15 (×4): qty 1

## 2017-07-15 MED ORDER — ONDANSETRON 4 MG PO TBDP: 4.0000 mg | ORAL_TABLET | Freq: Four times a day (QID) | ORAL | Status: DC | PRN

## 2017-07-15 MED ORDER — BUPIVACAINE-EPINEPHRINE (PF) 0.5% -1:200000 IJ SOLN
INTRAMUSCULAR | Status: AC
Start: 2017-07-15 — End: ?
  Filled 2017-07-15: qty 30

## 2017-07-15 MED ORDER — LIDOCAINE 2% (20 MG/ML) 5 ML SYRINGE
INTRAMUSCULAR | Status: AC
  Filled 2017-07-15: qty 5

## 2017-07-15 MED ORDER — LIDOCAINE 2% (20 MG/ML) 5 ML SYRINGE
INTRAMUSCULAR | Status: DC | PRN
  Administered 2017-07-15: 100 mg via INTRAVENOUS

## 2017-07-15 MED ORDER — ONDANSETRON HCL 4 MG/2ML IJ SOLN
4.0000 mg | Freq: Four times a day (QID) | INTRAMUSCULAR | Status: DC | PRN
  Administered 2017-07-15 – 2017-07-17 (×3): 4 mg via INTRAVENOUS
  Filled 2017-07-15 (×3): qty 2

## 2017-07-15 MED ORDER — AMITRIPTYLINE HCL 25 MG PO TABS
25.0000 mg | ORAL_TABLET | Freq: Every day | ORAL | Status: DC
  Administered 2017-07-15 – 2017-07-17 (×3): 25 mg via ORAL
  Filled 2017-07-15 (×3): qty 1

## 2017-07-15 MED ORDER — BUPIVACAINE-EPINEPHRINE 0.5% -1:200000 IJ SOLN
INTRAMUSCULAR | Status: DC | PRN
  Administered 2017-07-15: 19 mL

## 2017-07-15 MED ORDER — MIDAZOLAM HCL 2 MG/2ML IJ SOLN
INTRAMUSCULAR | Status: AC
  Filled 2017-07-15: qty 2

## 2017-07-15 MED ORDER — 0.9 % SODIUM CHLORIDE (POUR BTL) OPTIME
TOPICAL | Status: DC | PRN
  Administered 2017-07-15: 1000 mL

## 2017-07-15 SURGICAL SUPPLY — 49 items
APPLIER CLIP LOGIC TI 5 (MISCELLANEOUS) IMPLANT
APPLIER CLIP ROT 10 11.4 M/L (STAPLE)
BINDER ABDOMINAL 12 ML 46-62 (SOFTGOODS) ×3 IMPLANT
BNDG GAUZE ELAST 4 BULKY (GAUZE/BANDAGES/DRESSINGS) IMPLANT
CANISTER SUCT 3000ML PPV (MISCELLANEOUS) IMPLANT
CHLORAPREP W/TINT 26ML (MISCELLANEOUS) ×3 IMPLANT
CLIP APPLIE ROT 10 11.4 M/L (STAPLE) IMPLANT
COVER SURGICAL LIGHT HANDLE (MISCELLANEOUS) ×3 IMPLANT
DERMABOND ADVANCED (GAUZE/BANDAGES/DRESSINGS) ×4
DERMABOND ADVANCED .7 DNX12 (GAUZE/BANDAGES/DRESSINGS) ×2 IMPLANT
DEVICE SECURE STRAP 25 ABSORB (INSTRUMENTS) ×3 IMPLANT
DEVICE TROCAR PUNCTURE CLOSURE (ENDOMECHANICALS) ×3 IMPLANT
DRAIN CHANNEL 19F RND (DRAIN) ×3 IMPLANT
DRAPE INCISE IOBAN 66X45 STRL (DRAPES) ×3 IMPLANT
DRAPE LAPAROSCOPIC ABDOMINAL (DRAPES) IMPLANT
ELECT CAUTERY BLADE 6.4 (BLADE) ×3 IMPLANT
ELECT REM PT RETURN 9FT ADLT (ELECTROSURGICAL) ×3
ELECTRODE REM PT RTRN 9FT ADLT (ELECTROSURGICAL) ×1 IMPLANT
EVACUATOR SILICONE 100CC (DRAIN) ×3 IMPLANT
GLOVE BIO SURGEON STRL SZ7.5 (GLOVE) ×3 IMPLANT
GOWN STRL REUS W/ TWL LRG LVL3 (GOWN DISPOSABLE) ×3 IMPLANT
GOWN STRL REUS W/TWL LRG LVL3 (GOWN DISPOSABLE) ×6
KIT BASIN OR (CUSTOM PROCEDURE TRAY) ×3 IMPLANT
KIT ROOM TURNOVER OR (KITS) ×3 IMPLANT
MARKER SKIN DUAL TIP RULER LAB (MISCELLANEOUS) ×3 IMPLANT
MESH VENTRALIGHT ST 7X9N (Mesh General) ×3 IMPLANT
NEEDLE SPNL 22GX3.5 QUINCKE BK (NEEDLE) ×3 IMPLANT
NS IRRIG 1000ML POUR BTL (IV SOLUTION) ×3 IMPLANT
PAD ARMBOARD 7.5X6 YLW CONV (MISCELLANEOUS) ×6 IMPLANT
PENCIL BUTTON HOLSTER BLD 10FT (ELECTRODE) ×3 IMPLANT
SCISSORS LAP 5X35 DISP (ENDOMECHANICALS) ×3 IMPLANT
SET IRRIG TUBING LAPAROSCOPIC (IRRIGATION / IRRIGATOR) IMPLANT
SHEARS HARMONIC ACE PLUS 36CM (ENDOMECHANICALS) ×3 IMPLANT
SLEEVE ENDOPATH XCEL 5M (ENDOMECHANICALS) ×3 IMPLANT
SUT ETHILON 2 0 FS 18 (SUTURE) ×3 IMPLANT
SUT MNCRL AB 4-0 PS2 18 (SUTURE) ×9 IMPLANT
SUT NOVA NAB DX-16 0-1 5-0 T12 (SUTURE) ×12 IMPLANT
SUT VIC AB 3-0 SH 27 (SUTURE) ×2
SUT VIC AB 3-0 SH 27XBRD (SUTURE) ×1 IMPLANT
SYR BULB IRRIGATION 50ML (SYRINGE) ×3 IMPLANT
TOWEL OR 17X24 6PK STRL BLUE (TOWEL DISPOSABLE) ×3 IMPLANT
TOWEL OR 17X26 10 PK STRL BLUE (TOWEL DISPOSABLE) ×3 IMPLANT
TRAY FOLEY CATH SILVER 16FR (SET/KITS/TRAYS/PACK) ×3 IMPLANT
TRAY LAPAROSCOPIC MC (CUSTOM PROCEDURE TRAY) ×3 IMPLANT
TROCAR XCEL BLUNT TIP 100MML (ENDOMECHANICALS) IMPLANT
TROCAR XCEL NON-BLD 11X100MML (ENDOMECHANICALS) IMPLANT
TROCAR XCEL NON-BLD 5MMX100MML (ENDOMECHANICALS) ×6 IMPLANT
TUBING INSUFFLATION (TUBING) ×3 IMPLANT
WATER STERILE IRR 1000ML POUR (IV SOLUTION) ×3 IMPLANT

## 2017-07-15 NOTE — Interval H&P Note (Signed)
History and Physical Interval Note:  07/15/2017 8:25 AM  [name] [name]  has presented today for surgery, with the diagnosis of VENTRAL HERNIA  The various methods of treatment have been discussed with the patient and family. After consideration of risks, benefits and other options for treatment, the patient has consented to  Procedure(s): [name] (N/A) INSERTION OF MESH (N/A) as a surgical intervention .  The patient's history has been reviewed, patient examined, no change in status, stable for surgery.  I have reviewed the patient's chart and labs.  Questions were answered to the patient's satisfaction.     TOTH III,Demetri Goshert S

## 2017-07-15 NOTE — Anesthesia Preprocedure Evaluation (Signed)
Anesthesia Evaluation  Patient identified by MRN, date of birth, ID band Patient awake    Reviewed: Allergy & Precautions, H&P , NPO status , Patient's Chart, lab work & pertinent test results  Airway Mallampati: II  TM Distance: >3 FB Neck ROM: Full    Dental no notable dental hx. (+) Dental Advisory Given   Pulmonary neg pulmonary ROS, asthma ,    Pulmonary exam normal breath sounds clear to auscultation       Cardiovascular Exercise Tolerance: Good negative cardio ROS Normal cardiovascular exam+ Valvular Problems/Murmurs  Rhythm:Regular Rate:Normal     Neuro/Psych  Headaches, Anxiety Depression negative neurological ROS  negative psych ROS   GI/Hepatic negative GI ROS, Neg liver ROS,   Endo/Other  negative endocrine ROSMorbid obesity  Renal/GU negative Renal ROS  negative genitourinary   Musculoskeletal negative musculoskeletal ROS (+)   Abdominal (+) + obese,   Peds negative pediatric ROS (+)  Hematology negative hematology ROS (+)   Anesthesia Other Findings   Reproductive/Obstetrics negative OB ROS                             Anesthesia Physical  Anesthesia Plan  ASA: III and emergent  Anesthesia Plan: General   Post-op Pain Management:    Induction: Intravenous  PONV Risk Score and Plan: 3 and Ondansetron, Dexamethasone and Midazolam  Airway Management Planned: Oral ETT  Additional Equipment:   Intra-op Plan:   Post-operative Plan: Extubation in OR  Informed Consent: I have reviewed the patients History and Physical, chart, labs and discussed the procedure including the risks, benefits and alternatives for the proposed anesthesia with the patient or authorized representative who has indicated his/her understanding and acceptance.   Dental advisory given  Plan Discussed with: CRNA  Anesthesia Plan Comments:         Anesthesia Quick Evaluation

## 2017-07-15 NOTE — Op Note (Signed)
07/15/2017  10:54 AM  PATIENT:  [name] [name]  37 y.o. female  PRE-OPERATIVE DIAGNOSIS:  VENTRAL HERNIA  POST-OPERATIVE DIAGNOSIS:  VENTRAL HERNIA  PROCEDURE:  Procedure(s): LAPAROSCOPIC ASSISTED VENTRAL HERNIA REPAIR (N/A) INSERTION OF MESH (N/A)  SURGEON:  Surgeon(s) and Role:    * Jovita Kussmaul, MD - Primary  PHYSICIAN ASSISTANT:   ASSISTANTS: Judyann Munson, RNFA   ANESTHESIA:   local and general  EBL:  50 mL   BLOOD ADMINISTERED:none  DRAINS: (1) Jackson-Pratt drain(s) with closed bulb suction in the subcutaneous space   LOCAL MEDICATIONS USED:  MARCAINE     SPECIMEN:  No Specimen  DISPOSITION OF SPECIMEN:  N/A  COUNTS:  YES  TOURNIQUET:  * No tourniquets in log *  DICTATION: .Dragon Dictation   After informed consent was obtained the patient was brought to the operating room and placed in the supine position on the operating table.  After adequate induction of general anesthesia the patient's abdomen was prepped with ChloraPrep, allowed to dry, and draped in usual sterile manner.  This included the use of an Ioban drape.  An appropriate timeout was performed.  A site was chosen on the left upper quadrant to access the abdominal cavity.  This area was infiltrated with quarter percent Marcaine.  A small stab incision was made with a 15 blade knife.  A 5 mm Optiview port and camera were used to bluntly dissected the layers of the abdominal wall under direct vision until access was gained to the abdominal cavity.  The abdomen was then insufflated with carbon dioxide without difficulty.  The abdomen was inspected and there were some omental adhesions to the anterior abdominal wall.  There appeared to be 2 ventral hernias, one at the umbilicus and a second below it.  2 other 5 mm ports were placed on the left side of the abdomen and one on the right under direct vision.  A harmonic scalpel was used to take down the adhesions.  There was no bowel adherent to the abdominal  wall.  Once the abdominal wall was free from any adhesions the sizes of the hernias was estimated.  I chose a 17 x 23 cm piece of ventral light mesh.  I placed 8 #1 Novafil stitches around the edge of the mesh at equidistant points with the coated side of the mesh oriented towards the bowel.  Next a small incision was made on the anterior abdominal wall through her old incision with a 15 blade knife.  The incision was carried through the skin and subcutaneous tissue sharply with electrocautery until the hernia sac was entered.  The hernia sac was excised sharply with electrocautery.  The fascial defects were connected.  The mesh was then placed in the abdominal cavity with appropriate orientation.  The fascial defect was then closed with multiple #1 Novafil figure-of-eight stitches.  Next the abdomen was insufflated again.  The fascial defects were nicely closed.  8 small stab incisions were made at points that corresponded to the tails of the anchor stitches.  A suture passer device was used to bring the tails of each stitch through the abdominal wall.  Each of these stitches was cinched down and tied.  The mesh was observed to be in good apposition to the anterior abdominal wall and completely covering the fascial closure.  Absorbable tacks were then placed between the anchor stitches so that there were no gaps or redundancy.  Once this was accomplished the area was examined and found to  be hemostatic.  The mesh was in good position.  The abdomen was generally inspected in all 4 quadrants and no other abnormalities were noted.  The gas was then allowed to escape and the ports were removed without difficulty.  A tonsil clamp was placed through the left lower port site and into the subcutaneous space and used to bring a 19 [place] round Blake drain into the subcutaneous space above the fascial closure.  The drain was anchored to the skin with a 2-0 nylon stitch.  The subcutaneous tissue of the midline incision was  closed with a running 3-0 Vicryl stitch.  The skin incisions were then all closed with 4-0 Monocryl subcuticular stitches.  Dermabond dressings were applied.  The patient tolerated the procedure well.  At the end of the case all needle sponge and instrument counts were correct.  The patient was then awakened and taken to recovery in stable condition.  PLAN OF CARE: Admit for overnight observation  PATIENT DISPOSITION:  PACU - hemodynamically stable.   Delay start of Pharmacological VTE agent (>24hrs) due to surgical blood loss or risk of bleeding: no

## 2017-07-15 NOTE — Anesthesia Postprocedure Evaluation (Signed)
Anesthesia Post Note  Patient: Marzetta Merino  Procedure(s) Performed: LAPAROSCOPIC ASSISTED VENTRAL HERNIA REPAIR (N/A Abdomen) INSERTION OF MESH (N/A Abdomen)     Patient location during evaluation: PACU Anesthesia Type: General Level of consciousness: awake and alert Pain management: pain level controlled Vital Signs Assessment: post-procedure vital signs reviewed and stable Respiratory status: spontaneous breathing, nonlabored ventilation and respiratory function stable Cardiovascular status: blood pressure returned to baseline and stable Postop Assessment: no apparent nausea or vomiting Anesthetic complications: no    Last Vitals:  Vitals:   07/15/17 1214 07/15/17 1229  BP: 114/73 117/74  Pulse: 96 98  Resp: (!) 21 18  Temp:    SpO2: 96% 96%    Last Pain:  Vitals:   07/15/17 1215  TempSrc:   PainSc: [place]

## 2017-07-15 NOTE — H&P (Signed)
[name] [name]  Location: [facility] Surgery Patient #: [id number] DOB: Oct 24, 1980 Single / Language: [name] [name] / Race: White Female   History of Present Illness  The patient is a 37 year old female who presents with abdominal pain. We're asked to see the patient in consultation by Dr. Betty Martinique to evaluate her for a ventral hernia. The patient is a 37 year old white female who had an exploratory surgery for an appendiceal tumor about 4 years ago. Over the last 6 months or so she has noticed some bulging of her lower abdominal wall. There has been some pain associated with it. She denies any nausea or vomiting. She denies any fevers or chills. Her appetite has been good and her bowels are working normally. She did have a CT scan in the last 6 months that showed both a small umbilical hernia as well as a moderate sized ventral hernia just below the umbilicus with no bowel within it.   Past Surgical History  Cesarean Section - 1  Gallbladder Surgery - Laparoscopic  Resection of Small Bowel   Diagnostic Studies History  Colonoscopy  never Mammogram  never Pap Smear  1-5 years ago  Allergies  No Known Drug Allergies  Allergies Reconciled   Medication History  Medications Reconciled Elavil OTC Sleep (Oral) Active. PROzac (20MG  Capsule, Oral) Active. PredniSONE (10MG  Tablet, Oral) Active.  Social History  Alcohol use  Occasional alcohol use. Caffeine use  Carbonated beverages. No drug use  Tobacco use  Never smoker.  Family History Cancer  Father. Diabetes Mellitus  Father. Heart Disease  Father. Migraine Headache  Brother, Mother.  Pregnancy / Birth History  Age at menarche  5 years. Gravida  3 Length (months) of breastfeeding  3-6 Maternal age  34-20 Para  3 Regular periods   Other Problems  Anxiety Disorder  Asthma  Back Pain  Cancer  Cholelithiasis  Depression  Heart murmur  Hemorrhoids  Migraine Headache      Review of Systems General Present- Fatigue. Not Present- Appetite Loss, Chills, Fever, Night Sweats, Weight Gain and Weight Loss. Skin Not Present- Change in Wart/Mole, Dryness, Hives, Jaundice, New Lesions, Non-Healing Wounds, Rash and Ulcer. HEENT Present- Wears glasses/contact lenses. Not Present- Earache, Hearing Loss, Hoarseness, Nose Bleed, Oral Ulcers, Ringing in the Ears, Seasonal Allergies, Sinus Pain, Sore Throat, Visual Disturbances and Yellow Eyes. Respiratory Not Present- Bloody sputum, Chronic Cough, Difficulty Breathing, Snoring and Wheezing. Breast Not Present- Breast Mass, Breast Pain, Nipple Discharge and Skin Changes. Cardiovascular Not Present- Chest Pain, Difficulty Breathing Lying Down, Leg Cramps, Palpitations, Rapid Heart Rate, Shortness of Breath and Swelling of Extremities. Gastrointestinal Present- Abdominal Pain, Bloody Stool and Hemorrhoids. Not Present- Bloating, Change in Bowel Habits, Chronic diarrhea, Constipation, Difficulty Swallowing, Excessive gas, Gets full quickly at meals, Indigestion, Nausea, Rectal Pain and Vomiting. Female Genitourinary Not Present- Frequency, Nocturia, Painful Urination, Pelvic Pain and Urgency. Musculoskeletal Present- Back Pain. Not Present- Joint Pain, Joint Stiffness, Muscle Pain, Muscle Weakness and Swelling of Extremities. Neurological Present- Headaches. Not Present- Decreased Memory, Fainting, Numbness, Seizures, Tingling, Tremor, Trouble walking and Weakness. Psychiatric Present- Anxiety and Depression. Not Present- Bipolar, Change in Sleep Pattern, Fearful and Frequent crying. Endocrine Not Present- Cold Intolerance, Excessive Hunger, Hair Changes, Heat Intolerance, Hot flashes and New Diabetes. Hematology Not Present- Blood Thinners, Easy Bruising, Excessive bleeding, Gland problems, HIV and Persistent Infections.  Vitals  Weight: 284.4 lb Height: 64in Body Surface Area: 2.27 m Body Mass Index: 48.82 kg/m   Temp.: 99.40F  Pulse: 94 (Regular)  BP: 135/80 (Sitting, Left Arm, Standard)       Physical Exam  General Mental Status-Alert. General Appearance-Consistent with stated age. Hydration-Well hydrated. Voice-Normal.  Head and Neck Head-normocephalic, atraumatic with no lesions or palpable masses. Trachea-midline. Thyroid Gland Characteristics - normal size and consistency.  Eye Eyeball - Bilateral-Extraocular movements intact. Sclera/Conjunctiva - Bilateral-No scleral icterus.  Chest and Lung Exam Chest and lung exam reveals -quiet, even and easy respiratory effort with no use of accessory muscles and on auscultation, normal breath sounds, no adventitious sounds and normal vocal resonance. Inspection Chest Wall - Normal. Back - normal.  Cardiovascular Cardiovascular examination reveals -normal heart sounds, regular rate and rhythm with no murmurs and normal pedal pulses bilaterally.  Abdomen Note: The abdomen is soft with mild tenderness to the incision below the umbilicus. She is morbidly obese. I cannot palpate a definite fascial defect.   Neurologic Neurologic evaluation reveals -alert and oriented x 3 with no impairment of recent or remote memory. Mental Status-Normal.  Musculoskeletal Normal Exam - Left-Upper Extremity Strength Normal and Lower Extremity Strength Normal. Normal Exam - Right-Upper Extremity Strength Normal and Lower Extremity Strength Normal.  Lymphatic Head & Neck  General Head & Neck Lymphatics: Bilateral - Description - Normal. Axillary  General Axillary Region: Bilateral - Description - Normal. Tenderness - Non Tender. Femoral & Inguinal  Generalized Femoral & Inguinal Lymphatics: Bilateral - Description - Normal. Tenderness - Non Tender.    Assessment & Plan  VENTRAL HERNIA WITHOUT OBSTRUCTION OR GANGRENE (K43.9) Impression: The patient appears to have 2 small ventral hernias that are likely  related to her previous exploratory surgery. Because of the risk of incarceration and strangulation and think she would benefit from having his hernias fixed. She would also like to have this done. I have discussed with her in detail the risks and benefits of the operation to fix the hernias as well as some of the technical aspects including the use of mesh and the risk of chronic pain and she understands and wishes to proceed. I think she is a good laparoscopic candidate.

## 2017-07-15 NOTE — Anesthesia Procedure Notes (Signed)
Procedure Name: Intubation Date/Time: 07/15/2017 8:43 AM Performed by: Renato Shin, CRNA Pre-anesthesia Checklist: Patient identified, Emergency Drugs available, Suction available and Patient being monitored Patient Re-evaluated:Patient Re-evaluated prior to induction Oxygen Delivery Method: Circle system utilized Preoxygenation: Pre-oxygenation with 100% oxygen Induction Type: IV induction Ventilation: Mask ventilation without difficulty Laryngoscope Size: Miller and 3 Grade View: Grade II Tube type: Oral Tube size: 7.0 mm Number of attempts: 1 Airway Equipment and Method: Stylet Placement Confirmation: ETT inserted through vocal cords under direct vision,  positive ETCO2,  breath sounds checked- equal and bilateral and CO2 detector Secured at: 21 cm Tube secured with: Tape Dental Injury: Teeth and Oropharynx as per pre-operative assessment

## 2017-07-15 NOTE — Transfer of Care (Signed)
Immediate Anesthesia Transfer of Care Note  Patient: [name] [name]  Procedure(s) Performed: LAPAROSCOPIC ASSISTED VENTRAL HERNIA REPAIR (N/A Abdomen) INSERTION OF MESH (N/A Abdomen)  Patient Location: PACU  Anesthesia Type:General  Level of Consciousness: awake, alert , oriented and patient cooperative  Airway & Oxygen Therapy: Patient Spontanous Breathing and Patient connected to nasal cannula oxygen  Post-op Assessment: Report given to RN, Post -op Vital signs reviewed and stable and Patient moving all extremities X 4  Post vital signs: Reviewed and stable  Last Vitals:  Vitals:   07/15/17 0640 07/15/17 1114  BP: (!) 143/74   Pulse: 82   Resp: 20   Temp: 37 C (!) (P) 36.4 C  SpO2: 98%     Last Pain:  Vitals:   07/15/17 0705  TempSrc:   PainSc: 2       Patients Stated Pain Goal: 3 (81/85/63 1497)  Complications: No apparent anesthesia complications

## 2017-07-16 ENCOUNTER — Encounter (HOSPITAL_COMMUNITY): Payer: Self-pay | Admitting: General Surgery

## 2017-07-16 LAB — POCT PREGNANCY, URINE: PREG TEST UR: NEGATIVE

## 2017-07-16 MED ORDER — KETOROLAC TROMETHAMINE 30 MG/ML IJ SOLN
30.0000 mg | Freq: Three times a day (TID) | INTRAMUSCULAR | Status: AC
  Administered 2017-07-16 (×3): 30 mg via INTRAVENOUS
  Filled 2017-07-16 (×3): qty 1

## 2017-07-16 NOTE — Progress Notes (Signed)
1 Day Post-Op   Subjective/Chief Complaint: Complains of pain and nausea   Objective: Vital signs in last 24 hours: Temp:  [97.5 F (36.4 C)-98.9 F (37.2 C)] 98.5 F (36.9 C) (02/12 0730) Pulse Rate:  [68-104] 68 (02/12 0730) Resp:  [10-21] 18 (02/12 0730) BP: (110-132)/(59-76) 114/70 (02/12 0730) SpO2:  [93 %-98 %] 97 % (02/12 0730) Weight:  [130.6 kg (288 lb)] 130.6 kg (288 lb) (02/11 1308) Last BM Date: 07/15/17  Intake/Output from previous day: 02/11 0701 - 02/12 0700 In: 2683.3 [P.O.:240; I.V.:2443.3] Out: 846 [Urine:775; Drains:21; Blood:50] Intake/Output this shift: No intake/output data recorded.  General appearance: alert and cooperative Resp: clear to auscultation bilaterally Cardio: regular rate and rhythm GI: soft, appropriately tender. good bs  Lab Results:  No results for input(s): WBC, HGB, HCT, PLT in the last 72 hours. BMET No results for input(s): NA, K, CL, CO2, GLUCOSE, BUN, CREATININE, CALCIUM in the last 72 hours. PT/INR No results for input(s): LABPROT, INR in the last 72 hours. ABG No results for input(s): PHART, HCO3 in the last 72 hours.  Invalid input(s): PCO2, PO2  Studies/Results: No results found.  Anti-infectives: Anti-infectives (From admission, onward)   Start     Dose/Rate Route Frequency Ordered Stop   07/15/17 0700  ceFAZolin (ANCEF) 3 g in dextrose 5 % 50 mL IVPB     3 g 130 mL/hr over 30 Minutes Intravenous To ShortStay Surgical 07/12/17 0944 07/15/17 0848      Assessment/Plan: s/p Procedure(s): LAPAROSCOPIC ASSISTED VENTRAL HERNIA REPAIR (N/A) INSERTION OF MESH (N/A) Advance diet as her nausea resolves Add toradol for pain control in addition to other meds D/c foley Ambulate Pod 1  LOS: 0 days    TOTH III,PAUL S 07/16/2017

## 2017-07-17 DIAGNOSIS — R112 Nausea with vomiting, unspecified: Secondary | ICD-10-CM | POA: Diagnosis not present

## 2017-07-17 DIAGNOSIS — K439 Ventral hernia without obstruction or gangrene: Secondary | ICD-10-CM | POA: Diagnosis present

## 2017-07-17 DIAGNOSIS — R109 Unspecified abdominal pain: Secondary | ICD-10-CM | POA: Diagnosis present

## 2017-07-17 DIAGNOSIS — F419 Anxiety disorder, unspecified: Secondary | ICD-10-CM | POA: Diagnosis present

## 2017-07-17 DIAGNOSIS — Z6841 Body Mass Index (BMI) 40.0 and over, adult: Secondary | ICD-10-CM | POA: Diagnosis not present

## 2017-07-17 DIAGNOSIS — F418 Other specified anxiety disorders: Secondary | ICD-10-CM | POA: Diagnosis present

## 2017-07-17 MED ORDER — PANTOPRAZOLE SODIUM 40 MG PO TBEC
40.0000 mg | DELAYED_RELEASE_TABLET | Freq: Every day | ORAL | Status: DC
  Administered 2017-07-17: 40 mg via ORAL
  Filled 2017-07-17: qty 1

## 2017-07-17 MED ORDER — PROMETHAZINE HCL 25 MG/ML IJ SOLN
12.5000 mg | Freq: Four times a day (QID) | INTRAMUSCULAR | Status: DC | PRN
  Administered 2017-07-17: 12.5 mg via INTRAVENOUS
  Filled 2017-07-17: qty 1

## 2017-07-17 MED ORDER — KETOROLAC TROMETHAMINE 30 MG/ML IJ SOLN
30.0000 mg | Freq: Three times a day (TID) | INTRAMUSCULAR | Status: AC
  Administered 2017-07-17 – 2017-07-18 (×3): 30 mg via INTRAVENOUS
  Filled 2017-07-17 (×3): qty 1

## 2017-07-17 MED ORDER — METOCLOPRAMIDE HCL 5 MG/ML IJ SOLN
5.0000 mg | Freq: Three times a day (TID) | INTRAMUSCULAR | Status: DC
  Administered 2017-07-17 – 2017-07-18 (×3): 5 mg via INTRAVENOUS
  Filled 2017-07-17 (×3): qty 2

## 2017-07-17 NOTE — Progress Notes (Signed)
2 Days Post-Op   Subjective/Chief Complaint: Still complaining of nausea and vomiting   Objective: Vital signs in last 24 hours: Temp:  [98.2 F (36.8 C)-98.7 F (37.1 C)] 98.2 F (36.8 C) (02/13 0511) Pulse Rate:  [71-93] 93 (02/13 0511) Resp:  [17-183] 183 (02/13 0511) BP: (109-119)/(53-69) 119/69 (02/13 0511) SpO2:  [94 %-97 %] 94 % (02/13 0511) Last BM Date: 07/15/17  Intake/Output from previous day: 02/12 0701 - 02/13 0700 In: 2014.2 [P.O.:865; I.V.:1149.2] Out: 625 [Urine:600; Drains:25] Intake/Output this shift: No intake/output data recorded.  General appearance: alert and cooperative Resp: clear to auscultation bilaterally Cardio: regular rate and rhythm GI: softer. appropriately tender. good bs. incisions look good  Lab Results:  No results for input([name] [name]): WBC, HGB, HCT, PLT in the last 72 hours. BMET No results for input([name] [name]): NA, K, CL, CO2, GLUCOSE, BUN, CREATININE, CALCIUM in the last 72 hours. PT/INR No results for input([name] [name]): LABPROT, INR in the last 72 hours. ABG No results for input([name] [name]): PHART, HCO3 in the last 72 hours.  Invalid input([name] [name]): PCO2, PO2  Studies/Results: No results found.  Anti-infectives: Anti-infectives (From admission, onward)   Start     Dose/Rate Route Frequency Ordered Stop   07/15/17 0700  ceFAZolin (ANCEF) 3 g in dextrose 5 % 50 mL IVPB     3 g 130 mL/hr over 30 Minutes Intravenous To ShortStay Surgical 07/12/17 0944 07/15/17 0848      Assessment/Plan: [name] [name]/p Procedure([name] [name]): LAPAROSCOPIC ASSISTED VENTRAL HERNIA REPAIR (N/A) INSERTION OF MESH (N/A) Advance diet as tolerated Add reglan and phenergan for nausea Ambulate Pod 2  LOS: 0 days    TOTH III,Nelia [name] [name] [name] 07/17/2017

## 2017-07-18 ENCOUNTER — Encounter (HOSPITAL_COMMUNITY): Payer: Self-pay

## 2017-07-18 LAB — BASIC METABOLIC PANEL
Anion gap: 9 (ref 5–15)
BUN: 12 mg/dL (ref 6–20)
CALCIUM: 8.3 mg/dL — AB (ref 8.9–10.3)
CHLORIDE: 110 mmol/L (ref 101–111)
CO2: 23 mmol/L (ref 22–32)
CREATININE: 0.77 mg/dL (ref 0.44–1.00)
GFR calc Af Amer: >60 mL/min (ref >60)
Glucose, Bld: 117 mg/dL — ABNORMAL HIGH (ref 65–99)
Potassium: 3.9 mmol/L (ref 3.5–5.1)
SODIUM: 142 mmol/L (ref 135–145)

## 2017-07-18 LAB — CBC
HCT: 36 % (ref 36.0–46.0)
Hemoglobin: 11.5 g/dL — ABNORMAL LOW (ref 12.0–15.0)
MCH: 29.6 pg (ref 26.0–34.0)
MCHC: 31.9 g/dL (ref 30.0–36.0)
MCV: 92.5 fL (ref 78.0–100.0)
PLATELETS: 247 10*3/uL (ref 150–400)
RBC: 3.89 MIL/uL (ref 3.87–5.11)
RDW: 15 % (ref 11.5–15.5)
WBC: 5.8 10*3/uL (ref 4.0–10.5)

## 2017-07-18 MED ORDER — HYDROCODONE-ACETAMINOPHEN 5-325 MG PO TABS: 1.0000 | ORAL_TABLET | Freq: Four times a day (QID) | ORAL | 0 refills | Status: DC | PRN

## 2017-07-18 NOTE — Progress Notes (Signed)
3 Days Post-Op   Subjective/Chief Complaint: Vomited yesterday but had 3 bm's. Tolerating food so far this am   Objective: Vital signs in last 24 hours: Temp:  [98.4 F (36.9 C)-99.1 F (37.3 C)] 98.4 F (36.9 C) (02/14 0736) Pulse Rate:  [81-87] 84 (02/14 0736) Resp:  [18] 18 (02/14 0736) BP: (99-127)/(60-76) 99/60 (02/14 0736) SpO2:  [94 %-96 %] 95 % (02/14 0736) Weight:  [135.5 kg (298 lb 11.6 oz)] 135.5 kg (298 lb 11.6 oz) (02/13 2200) Last BM Date: 07/18/17  Intake/Output from previous day: 02/13 0701 - 02/14 0700 In: 2165 [P.O.:960; I.V.:1205] Out: 215 [Urine:200; Drains:15] Intake/Output this shift: No intake/output data recorded.  General appearance: alert and cooperative Resp: clear to auscultation bilaterally Cardio: regular rate and rhythm GI: softer. less tender.   Lab Results:  Recent Labs    07/18/17 0538  WBC 5.8  HGB 11.5*  HCT 36.0  PLT 247   BMET Recent Labs    07/18/17 0538  NA 142  K 3.9  CL 110  CO2 23  GLUCOSE 117*  BUN 12  CREATININE 0.77  CALCIUM 8.3*   PT/INR No results for input(s): LABPROT, INR in the last 72 hours. ABG No results for input(s): PHART, HCO3 in the last 72 hours.  Invalid input(s): PCO2, PO2  Studies/Results: No results found.  Anti-infectives: Anti-infectives (From admission, onward)   Start     Dose/Rate Route Frequency Ordered Stop   07/15/17 0700  ceFAZolin (ANCEF) 3 g in dextrose 5 % 50 mL IVPB     3 g 130 mL/hr over 30 Minutes Intravenous To ShortStay Surgical 07/12/17 0944 07/15/17 0848      Assessment/Plan: s/p Procedure(s): LAPAROSCOPIC ASSISTED VENTRAL HERNIA REPAIR (N/A) INSERTION OF MESH (N/A) Advance diet as she can tolerate Ambulate Pod 3 Maybe home tomorrow if there is no more vomiting  LOS: 1 day    TOTH III,Keneth Borg S 07/18/2017

## 2017-07-18 NOTE — Progress Notes (Signed)
Discharge home. Home discharge instruction given, no question verbalized. 

## 2017-07-25 NOTE — Discharge Summary (Signed)
Physician Discharge Summary  Patient ID: [name] [name] [name] DOB/AGE: Jan 07, 1981 37 y.o.  Admit date: 07/15/2017 Discharge date: 07/25/2017  Admission Diagnoses:  Discharge Diagnoses:  Active Problems:   Ventral hernia without obstruction or gangrene   Discharged Condition: good  Hospital Course: the patient underwent lap ventral hernia repair with mesh. Her postop course was complicated by persistent nausea and pain which took a couple days to resolve. She was then ready for discharge home.  Consults: None  Significant Diagnostic Studies: none  Treatments: surgery: as above  Discharge Exam: Blood pressure (!) 112/55, pulse 83, temperature 98.8 F (37.1 C), temperature source Oral, resp. rate 18, height 5\' 4"  (1.626 m), weight 135.5 kg (298 lb 11.6 oz), last menstrual period 06/17/2017, SpO2 97 %. General appearance: alert and cooperative Resp: clear to auscultation bilaterally Cardio: regular rate and rhythm GI: soft, mild tenderness. incisions look good  Disposition: 01-Home or Self Care  Discharge Instructions    Call MD for:  difficulty breathing, headache or visual disturbances   Complete by:  As directed    Call MD for:  extreme fatigue   Complete by:  As directed    Call MD for:  hives   Complete by:  As directed    Call MD for:  persistant dizziness or light-headedness   Complete by:  As directed    Call MD for:  persistant nausea and vomiting   Complete by:  As directed    Call MD for:  redness, tenderness, or signs of infection (pain, swelling, redness, odor or green/yellow discharge around incision site)   Complete by:  As directed    Call MD for:  severe uncontrolled pain   Complete by:  As directed    Call MD for:  temperature >100.4   Complete by:  As directed    Diet - low sodium heart healthy   Complete by:  As directed    Discharge instructions   Complete by:  As directed    May shower. Diet as tolerated. No heavy lifting   Increase  activity slowly   Complete by:  As directed    No wound care   Complete by:  As directed      Allergies as of 07/18/2017   No Known Allergies     Medication List    TAKE these medications   amitriptyline 25 MG tablet Commonly known as:  ELAVIL Take 1 tablet (25 mg total) by mouth at bedtime. What changed:  how much to take   aspirin-acetaminophen-caffeine 250-250-65 MG tablet Commonly known as:  EXCEDRIN MIGRAINE Take 1 tablet by mouth every 6 (six) hours as needed for headache.   FLUoxetine 20 MG capsule Commonly known as:  PROZAC Take 1 capsule (20 mg total) by mouth daily. What changed:  when to take this   HYDROcodone-acetaminophen 5-325 MG tablet Commonly known as:  NORCO/VICODIN Take 1-2 tablets by mouth every 6 (six) hours as needed for moderate pain.   ibuprofen 200 MG tablet Commonly known as:  ADVIL,MOTRIN Take 400 mg by mouth every 6 (six) hours as needed for headache or moderate pain.   norgestimate-ethinyl estradiol 0.25-35 MG-MCG tablet Commonly known as:  ORTHO-CYCLEN (28) Take 1 tablet by mouth daily.      Follow-up Information    Autumn Messing III, MD Follow up in 3 week(s).   Specialty:  General Surgery Contact information: [address] [place] [phone]  Signed: Jovita Kussmaul S 07/25/2017, 1:13 PM

## 2017-08-08 ENCOUNTER — Telehealth: Payer: Self-pay | Admitting: *Deleted

## 2017-08-08 NOTE — Telephone Encounter (Signed)
Copied from [place] [id number]. Topic: Referral - Status >> Aug 08, 2017  1:36 PM Bea Graff, NT wrote: Reason for CRM: Pt calling to check on referral to a gyn office for an abnormal pap smear. Dr. Martinique sent a referral to the [facility] at [facility] on 06/21/17. They could not accept referral as to they do not take medicaid at that office. They sent the referral to [facility] on 06/24/17 and pt has not been contacted for an appt. Provided pt with the phone number to [facility] and she was going to call them to check on this referral. I did let her know I would send a message to Dr. Doug Sou office as well to see if someone could follow up on the referral as well.    >> Aug 08, 2017  2:41 PM Bea Graff, NT wrote: Pt contacted [facility] and they will not take her on as a pt due to her having medicaid. Looked up for pt offices in [place] that accept medicaid and looks like Dr. Ronita Hipps on [address] does. Can a referral be sent there or to an office that does accept medicaid? Please call pt and discuss with pt.

## 2017-08-14 NOTE — Telephone Encounter (Signed)
Per referral notes pt has been scheduled at [facility]. Called office to confirm appt has been scheduled. They do have her appointment scheduled. Advised them pt states that she is not aware of appt. They will call her to confirm. Nothing further needed.

## 2017-08-19 ENCOUNTER — Ambulatory Visit: Payer: Medicaid Other | Admitting: Obstetrics and Gynecology

## 2017-09-09 ENCOUNTER — Encounter: Payer: Self-pay | Admitting: Family Medicine

## 2017-09-09 ENCOUNTER — Ambulatory Visit (INDEPENDENT_AMBULATORY_CARE_PROVIDER_SITE_OTHER): Payer: Medicaid Other | Admitting: Family Medicine

## 2017-09-09 ENCOUNTER — Other Ambulatory Visit: Payer: Self-pay | Admitting: Family Medicine

## 2017-09-09 ENCOUNTER — Encounter: Payer: Self-pay | Admitting: *Deleted

## 2017-09-09 ENCOUNTER — Other Ambulatory Visit (HOSPITAL_COMMUNITY)
Admission: RE | Admit: 2017-09-09 | Discharge: 2017-09-09 | Disposition: A | Discharge status: Home / Self Care | Payer: Medicaid Other | Source: Ambulatory Visit | Attending: Family Medicine | Admitting: Family Medicine

## 2017-09-09 VITALS — BP 120/69 | HR 83 | Wt 283.4 lb

## 2017-09-09 DIAGNOSIS — G43709 Chronic migraine without aura, not intractable, without status migrainosus: Secondary | ICD-10-CM

## 2017-09-09 DIAGNOSIS — R87613 High grade squamous intraepithelial lesion on cytologic smear of cervix (HGSIL): Secondary | ICD-10-CM

## 2017-09-09 DIAGNOSIS — G4700 Insomnia, unspecified: Secondary | ICD-10-CM

## 2017-09-09 LAB — POCT PREGNANCY, URINE: Preg Test, Ur: NEGATIVE

## 2017-09-09 NOTE — Progress Notes (Signed)
Patient Name: [name] [name], female   DOB: Nov 24, 1980, 37 y.o.  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    Exam: Vulva and Vagina grossly normal.  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  Will call patient with results.

## 2017-09-18 ENCOUNTER — Ambulatory Visit: Payer: Medicaid Other | Admitting: Family Medicine

## 2017-09-23 ENCOUNTER — Encounter (INDEPENDENT_AMBULATORY_CARE_PROVIDER_SITE_OTHER): Payer: Self-pay

## 2017-10-03 ENCOUNTER — Other Ambulatory Visit: Payer: Self-pay | Admitting: Family Medicine

## 2017-10-03 DIAGNOSIS — F321 Major depressive disorder, single episode, moderate: Secondary | ICD-10-CM

## 2017-11-04 ENCOUNTER — Ambulatory Visit: Payer: Medicaid Other | Admitting: Family Medicine

## 2017-11-25 ENCOUNTER — Ambulatory Visit: Payer: Medicaid Other | Admitting: Family Medicine

## 2017-11-25 ENCOUNTER — Ambulatory Visit (INDEPENDENT_AMBULATORY_CARE_PROVIDER_SITE_OTHER): Payer: Medicaid Other | Admitting: Family Medicine

## 2017-11-25 ENCOUNTER — Encounter: Payer: Self-pay | Admitting: Family Medicine

## 2017-11-25 ENCOUNTER — Other Ambulatory Visit (HOSPITAL_COMMUNITY)
Admission: RE | Admit: 2017-11-25 | Discharge: 2017-11-25 | Disposition: A | Discharge status: Home / Self Care | Payer: Medicaid Other | Source: Ambulatory Visit | Attending: Family Medicine | Admitting: Family Medicine

## 2017-11-25 VITALS — BP 120/84 | HR 88 | Temp 98.2°F | Resp 12 | Ht 64.0 in | Wt 288.4 lb

## 2017-11-25 DIAGNOSIS — G43709 Chronic migraine without aura, not intractable, without status migrainosus: Secondary | ICD-10-CM | POA: Diagnosis not present

## 2017-11-25 DIAGNOSIS — R739 Hyperglycemia, unspecified: Secondary | ICD-10-CM

## 2017-11-25 DIAGNOSIS — Z6841 Body Mass Index (BMI) 40.0 and over, adult: Secondary | ICD-10-CM

## 2017-11-25 DIAGNOSIS — G4700 Insomnia, unspecified: Secondary | ICD-10-CM

## 2017-11-25 DIAGNOSIS — F321 Major depressive disorder, single episode, moderate: Secondary | ICD-10-CM

## 2017-11-25 DIAGNOSIS — D069 Carcinoma in situ of cervix, unspecified: Secondary | ICD-10-CM | POA: Insufficient documentation

## 2017-11-25 DIAGNOSIS — Z23 Encounter for immunization: Secondary | ICD-10-CM | POA: Diagnosis not present

## 2017-11-25 DIAGNOSIS — N871 Moderate cervical dysplasia: Secondary | ICD-10-CM | POA: Diagnosis present

## 2017-11-25 LAB — POCT PREGNANCY, URINE: PREG TEST UR: NEGATIVE

## 2017-11-25 MED ORDER — VENLAFAXINE HCL ER 75 MG PO CP24: 75.0000 mg | ORAL_CAPSULE | Freq: Every day | ORAL | 2 refills | Status: DC

## 2017-11-25 NOTE — Assessment & Plan Note (Signed)
Since her last OV she has lost about 4 Lb. Encouraged to continue working on wt loss and to engage in regular physical activity.

## 2017-11-25 NOTE — Addendum Note (Signed)
Addended by: Bethanne Ginger on: 11/25/2017 05:35 PM   Modules accepted: Orders

## 2017-11-25 NOTE — Patient Instructions (Addendum)
A few things to remember from today's visit:   Need for Tdap vaccination - Plan: Tdap vaccine greater than or equal to 37yo IM  Hyperglycemia - Plan: POCT glycosylated hemoglobin (Hb A1C)  Moderate major depression (HCC) - Plan: venlafaxine XR (EFFEXOR XR) 75 MG 24 hr capsule  Insomnia, unspecified type  Chronic migraine without aura without status migrainosus, not intractable - Plan: venlafaxine XR (EFFEXOR XR) 75 MG 24 hr capsule  Today we discontinue Prozac. You will start Effexor 75 mg daily. Continue amitriptyline 12.5 mg at bedtime. Good sleep hygiene very important. Please let me know in about 6 weeks how you are doing with the Effexor and I will see you back in 4 months. Strongly recommend considering psychotherapy.  Also continue working on weight loss.  Please be sure medication list is accurate. If a new problem present, please set up appointment sooner than planned today.

## 2017-11-25 NOTE — Assessment & Plan Note (Signed)
Getting worse. Psychotherapy recommended, handout given. Stop Prozar. Start Effexor 75 mg daily Instructed to let me know in 6 weeks how she is doing with new medication. F/U in 3-4 months.

## 2017-11-25 NOTE — Progress Notes (Signed)
HPI:   [name] is a 37 y.o. female, who is here today for 5-6 months follow up.   She was last seen on 06/18/17.  Depression and anxiety: She is on Fluoxetine 20 mg,which had helped until now. She was on Celexa in the past.  For the past 1-2 months she has had crying spells. Negative for suicidal thoughts.  No specific even exacerbating problem. Her daughter told her recently that she has depression.  Headaches: For the past months she is also having more headaches. Bitemporal,occipital,and parietal. Associated , no vomiting or photophobia but phonophobia.  Amitriptyline 25 mg has helped with headaches but when she takes the whole tab she feels drowsy all day.  She has had 2 headaches this month, one lasted 3 days.  Amitriptyline 12.5 mg helps her with sleep. Sleeps through the night.      She has consistent with her diet, she is not exercising regularly. She is drinking more water,decreased soda but it causes headache.  07/2017 glucose 117.   Lab Results  Component Value Date   HGBA1C 5.5 08/08/2007     Review of Systems  Constitutional: Negative for activity change, appetite change, fatigue and fever.  HENT: Negative for mouth sores, nosebleeds and trouble swallowing.   Eyes: Negative for redness and visual disturbance.  Respiratory: Negative for cough, shortness of breath and wheezing.   Cardiovascular: Negative for chest pain, palpitations and leg swelling.  Gastrointestinal: Negative for abdominal pain, nausea and vomiting.       Negative for changes in bowel habits.  Genitourinary: Negative for decreased urine volume and hematuria.  Musculoskeletal: Negative for gait problem and myalgias.  Neurological: Positive for headaches. Negative for syncope and weakness.  Psychiatric/Behavioral: Positive for sleep disturbance. The patient is nervous/anxious.      Current Outpatient Medications on File Prior to Visit  Medication Sig Dispense  Refill  . amitriptyline (ELAVIL) 25 MG tablet Take 0.5 tablets (12.5 mg total) by mouth at bedtime. 45 tablet 1  . aspirin-acetaminophen-caffeine (EXCEDRIN MIGRAINE) 250-250-65 MG tablet Take 1 tablet by mouth every 6 (six) hours as needed for headache.    Marland Kitchen HYDROcodone-acetaminophen (NORCO/VICODIN) 5-325 MG tablet Take 1-2 tablets by mouth every 6 (six) hours as needed for moderate pain. 20 tablet 0  . ibuprofen (ADVIL,MOTRIN) 200 MG tablet Take 400 mg by mouth every 6 (six) hours as needed for headache or moderate pain.    . norgestimate-ethinyl estradiol (ORTHO-CYCLEN, 28,) 0.25-35 MG-MCG tablet Take 1 tablet by mouth daily. 3 Package 3   No current facility-administered medications on file prior to visit.      Past Medical History:  Diagnosis Date  . Anxiety   . Asthma    childhood exercise induced only  . 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 Cholecystitis chronic, acute   . Depression   . Gallstones   . Headache(784.0)    migraines  . Heart murmur    no indications for 7-77yrs per pt.   No Known Allergies  Social History   Socioeconomic History  . Marital status: Single    Spouse name: Not on file  . Number of children: 3  . Years of education: Not on file  . Highest education level: Not on file  Occupational History    Comment: [employer]-part time  Social Needs  . Financial resource strain: Not on file  . Food insecurity:    Worry: Not on file    Inability: Not on file  .  Transportation needs:    Medical: Not on file    Non-medical: Not on file  Tobacco Use  . Smoking status: Never Smoker  . Smokeless tobacco: Never Used  Substance and Sexual Activity  . Alcohol use: No  . Drug use: No  . Sexual activity: Yes    Partners: Male    Birth control/protection: Pill  Lifestyle  . Physical activity:    Days per week: Not on file    Minutes per session: Not on file  . Stress: Not on file  Relationships  . Social connections:    Talks on phone: Not on file    Gets  together: Not on file    Attends religious service: Not on file    Active member of club or organization: Not on file    Attends meetings of clubs or organizations: Not on file    Relationship status: Not on file  Other Topics Concern  . Not on file  Social History Narrative   Single, lives with significant other [name] [name])   #3 children: ages 13-11-2 1/2 months   Employer: [name]   Independent ADLs    Vitals:   11/25/17 0831  BP: 120/84  Pulse: 88  Resp: 12  Temp: 98.2 F (36.8 C)  SpO2: 97%   Body mass index is 49.5 kg/m.   Wt Readings from Last 3 Encounters:  11/25/17 289 lb 8 oz (131.3 kg)  11/25/17 288 lb 6.4 oz (130.8 kg)  09/09/17 283 lb 6.4 oz (128.5 kg)   291 Lb on 06/18/17   Physical Exam  Nursing note and vitals reviewed. Constitutional: She is oriented to person, place, and time. She appears well-developed. No distress.  HENT:  Head: Normocephalic and atraumatic.  Mouth/Throat: Oropharynx is clear and moist and mucous membranes are normal.  Eyes: Pupils are equal, round, and reactive to light. Conjunctivae are normal.  Cardiovascular: Normal rate and regular rhythm.  No murmur heard. Pulses:      Dorsalis pedis pulses are 2+ on the right side, and 2+ on the left side.  Respiratory: Effort normal and breath sounds normal. No respiratory distress.  GI: Soft. She exhibits no mass. There is no hepatomegaly. There is no tenderness.  Musculoskeletal: She exhibits no edema.  Lymphadenopathy:    She has no cervical adenopathy.  Neurological: She is alert and oriented to person, place, and time. She has normal strength. Gait normal.  Skin: Skin is warm. No erythema.  Psychiatric: Her mood appears anxious. She expresses no suicidal ideation. She expresses no suicidal plans.  Well groomed, good eye contact.       ASSESSMENT AND PLAN:   [name] was seen today for 6 months follow-up.  Orders Placed This Encounter  Procedures  . Tdap vaccine  greater than or equal to 7yo IM  . POCT glycosylated hemoglobin (Hb A1C)    Class 3 severe obesity with body mass index (BMI) of 45.0 to 49.9 in adult [facility]) Since her last OV she has lost about 4 Lb. Encouraged to continue working on wt loss and to engage in regular physical activity.   Migraine headache Migraine with tension headache component. Effexor may help. Instructed about warning signs. F/U in 3-4 months,before if needed.  Moderate major depression ([place]) Getting worse. Psychotherapy recommended, handout given. Stop Prozar. Start Effexor 75 mg daily Instructed to let me know in 6 weeks how she is doing with new medication. F/U in 3-4 months.  Insomnia, unspecified Well controlled with Amitriptyline.  No changes in current management. Good sleep hygiene.    Hyperglycemia  A1C here in the office show "error.", we will bring her back for HgA1C.  - POCT glycosylated hemoglobin (Hb A1C)  Need for Tdap vaccination  - Tdap vaccine greater than or equal to 7yo IM      Clement Deneault G. Martinique, MD  [facility]. [place] office.

## 2017-11-25 NOTE — Assessment & Plan Note (Signed)
Well controlled with Amitriptyline. No changes in current management. Good sleep hygiene.

## 2017-11-25 NOTE — Patient Instructions (Signed)

## 2017-11-25 NOTE — Assessment & Plan Note (Signed)
Migraine with tension headache component. Effexor may help. Instructed about warning signs. F/U in 3-4 months,before if needed.

## 2017-11-25 NOTE — Progress Notes (Signed)
  LEEP PROCEDURE NOTE Pap smear and colposcopy reviewed.   Pap HSIL Colpo Biopsy CIN2 ECC negative  Risks, benefits, alternatives, and limitations of procedure explained to patient, including pain, bleeding, infection, failure to remove abnormal tissue and failure to cure dysplasia, need for repeat procedures, damage to pelvic organs, cervical incompetence.  Role of HPV,cervical dysplasia and need for close followup was empasized. Informed written consent was obtained. All questions were answered. Time out performed.   Procedure: The patient was placed in lithotomy position and the bivalved coated speculum was placed in the patient's vagina. A grounding pad placed on the patient. Lugol's solution was applied to the cervix and areas of decreased uptake were noted 9 and 12.   Local anesthesia was administered via an intracervical block using 20cc of 2% Lidocaine with epinephrine. The suction was turned on and the Large 1X Fisher Cone Biopsy Excisor on 30 Watts of cutting current was used to excise the area of decreased uptake and excise the entire transformation zone. Excellent hemostasis was achieved using roller ball coagulation set at 50 Watts coagulation current. Monsel's solution was then applied and the speculum was removed from the vagina. Specimens were sent to pathology.  The patient tolerated the procedure well. Post-operative instructions given to patient, including instruction to seek medical attention for persistent bright red bleeding, fever, abdominal/pelvic pain, dysuria, nausea or vomiting. She was also told about the possibility of having copious yellow to black tinged discharge. She was counseled to avoid anything in the vagina (sex/douching/tampons) for 4-6 weeks.

## 2017-12-09 ENCOUNTER — Encounter (INDEPENDENT_AMBULATORY_CARE_PROVIDER_SITE_OTHER): Payer: Self-pay

## 2017-12-09 ENCOUNTER — Encounter: Payer: Self-pay | Admitting: Family Medicine

## 2017-12-11 ENCOUNTER — Encounter: Payer: Self-pay | Admitting: Family Medicine

## 2017-12-17 ENCOUNTER — Encounter: Payer: Self-pay | Admitting: Family Medicine

## 2018-01-13 ENCOUNTER — Encounter: Payer: Self-pay | Admitting: Family Medicine

## 2018-02-27 ENCOUNTER — Other Ambulatory Visit: Payer: Self-pay

## 2018-02-27 ENCOUNTER — Emergency Department (HOSPITAL_COMMUNITY): Payer: Medicaid Other

## 2018-02-27 ENCOUNTER — Encounter (HOSPITAL_COMMUNITY): Payer: Self-pay

## 2018-02-27 ENCOUNTER — Inpatient Hospital Stay (HOSPITAL_COMMUNITY)
Admission: EM | Admit: 2018-02-27 | Discharge: 2018-03-03 | DRG: 392 | Disposition: A | Discharge status: Home / Self Care | Payer: Medicaid Other | Attending: Internal Medicine | Admitting: Internal Medicine

## 2018-02-27 DIAGNOSIS — N83202 Unspecified ovarian cyst, left side: Secondary | ICD-10-CM | POA: Diagnosis not present

## 2018-02-27 DIAGNOSIS — Z791 Long term (current) use of non-steroidal anti-inflammatories (NSAID): Secondary | ICD-10-CM

## 2018-02-27 DIAGNOSIS — K5732 Diverticulitis of large intestine without perforation or abscess without bleeding: Principal | ICD-10-CM | POA: Diagnosis present

## 2018-02-27 DIAGNOSIS — Z9049 Acquired absence of other specified parts of digestive tract: Secondary | ICD-10-CM

## 2018-02-27 DIAGNOSIS — C7A020 Malignant carcinoid tumor of the appendix: Secondary | ICD-10-CM | POA: Diagnosis present

## 2018-02-27 DIAGNOSIS — K5792 Diverticulitis of intestine, part unspecified, without perforation or abscess without bleeding: Secondary | ICD-10-CM | POA: Diagnosis not present

## 2018-02-27 DIAGNOSIS — G4700 Insomnia, unspecified: Secondary | ICD-10-CM | POA: Diagnosis present

## 2018-02-27 DIAGNOSIS — N760 Acute vaginitis: Secondary | ICD-10-CM

## 2018-02-27 DIAGNOSIS — D649 Anemia, unspecified: Secondary | ICD-10-CM

## 2018-02-27 DIAGNOSIS — R000 Tachycardia, unspecified: Secondary | ICD-10-CM | POA: Diagnosis not present

## 2018-02-27 DIAGNOSIS — R3989 Other symptoms and signs involving the genitourinary system: Secondary | ICD-10-CM

## 2018-02-27 DIAGNOSIS — F321 Major depressive disorder, single episode, moderate: Secondary | ICD-10-CM | POA: Diagnosis present

## 2018-02-27 DIAGNOSIS — Z806 Family history of leukemia: Secondary | ICD-10-CM

## 2018-02-27 DIAGNOSIS — D069 Carcinoma in situ of cervix, unspecified: Secondary | ICD-10-CM | POA: Diagnosis present

## 2018-02-27 DIAGNOSIS — Z98891 History of uterine scar from previous surgery: Secondary | ICD-10-CM

## 2018-02-27 DIAGNOSIS — N8300 Follicular cyst of ovary, unspecified side: Secondary | ICD-10-CM | POA: Diagnosis present

## 2018-02-27 DIAGNOSIS — F419 Anxiety disorder, unspecified: Secondary | ICD-10-CM | POA: Diagnosis present

## 2018-02-27 DIAGNOSIS — N390 Urinary tract infection, site not specified: Secondary | ICD-10-CM | POA: Diagnosis present

## 2018-02-27 DIAGNOSIS — Z8589 Personal history of malignant neoplasm of other organs and systems: Secondary | ICD-10-CM

## 2018-02-27 DIAGNOSIS — Z79899 Other long term (current) drug therapy: Secondary | ICD-10-CM

## 2018-02-27 DIAGNOSIS — Z793 Long term (current) use of hormonal contraceptives: Secondary | ICD-10-CM

## 2018-02-27 DIAGNOSIS — Z713 Dietary counseling and surveillance: Secondary | ICD-10-CM

## 2018-02-27 DIAGNOSIS — Z6841 Body Mass Index (BMI) 40.0 and over, adult: Secondary | ICD-10-CM

## 2018-02-27 DIAGNOSIS — E876 Hypokalemia: Secondary | ICD-10-CM | POA: Diagnosis present

## 2018-02-27 DIAGNOSIS — B9689 Other specified bacterial agents as the cause of diseases classified elsewhere: Secondary | ICD-10-CM

## 2018-02-27 LAB — URINALYSIS, ROUTINE W REFLEX MICROSCOPIC
Bilirubin Urine: NEGATIVE
Glucose, UA: NEGATIVE mg/dL
Ketones, ur: 5 mg/dL — AB
Nitrite: POSITIVE — AB
PH: 5 (ref 5.0–8.0)
Protein, ur: NEGATIVE mg/dL
SPECIFIC GRAVITY, URINE: 1.02 (ref 1.005–1.030)

## 2018-02-27 LAB — HEPATIC FUNCTION PANEL
ALT: 16 U/L (ref 0–44)
AST: 14 U/L — AB (ref 15–41)
Albumin: 3.1 g/dL — ABNORMAL LOW (ref 3.5–5.0)
Alkaline Phosphatase: 37 U/L — ABNORMAL LOW (ref 38–126)
BILIRUBIN INDIRECT: 0.5 mg/dL (ref 0.3–0.9)
Bilirubin, Direct: 0.1 mg/dL (ref 0.0–0.2)
TOTAL PROTEIN: 7.2 g/dL (ref 6.5–8.1)
Total Bilirubin: 0.6 mg/dL (ref 0.3–1.2)

## 2018-02-27 LAB — BASIC METABOLIC PANEL
Anion gap: 9 (ref 5–15)
BUN: 8 mg/dL (ref 6–20)
CALCIUM: 8.8 mg/dL — AB (ref 8.9–10.3)
CHLORIDE: 108 mmol/L (ref 98–111)
CO2: 26 mmol/L (ref 22–32)
Creatinine, Ser: 0.69 mg/dL (ref 0.44–1.00)
GFR calc non Af Amer: >60 mL/min (ref >60)
GLUCOSE: 95 mg/dL (ref 70–99)
Potassium: 3.4 mmol/L — ABNORMAL LOW (ref 3.5–5.1)
Sodium: 143 mmol/L (ref 135–145)

## 2018-02-27 LAB — I-STAT BETA HCG BLOOD, ED (MC, WL, AP ONLY)

## 2018-02-27 LAB — CBC
HCT: 37.1 % (ref 36.0–46.0)
Hemoglobin: 12.5 g/dL (ref 12.0–15.0)
MCH: 30.4 pg (ref 26.0–34.0)
MCHC: 33.7 g/dL (ref 30.0–36.0)
MCV: 90.3 fL (ref 78.0–100.0)
PLATELETS: 286 10*3/uL (ref 150–400)
RBC: 4.11 MIL/uL (ref 3.87–5.11)
RDW: 13.7 % (ref 11.5–15.5)
WBC: 9.8 10*3/uL (ref 4.0–10.5)

## 2018-02-27 LAB — I-STAT CG4 LACTIC ACID, ED: LACTIC ACID, VENOUS: 1.22 mmol/L (ref 0.5–1.9)

## 2018-02-27 LAB — LIPASE, BLOOD: Lipase: 30 U/L (ref 11–51)

## 2018-02-27 LAB — CBG MONITORING, ED: Glucose-Capillary: 85 mg/dL (ref 70–99)

## 2018-02-27 MED ORDER — IOPAMIDOL (ISOVUE-300) INJECTION 61%
100.0000 mL | Freq: Once | INTRAVENOUS | Status: AC | PRN
  Administered 2018-02-27: 100 mL via INTRAVENOUS

## 2018-02-27 MED ORDER — ONDANSETRON HCL 4 MG/2ML IJ SOLN
4.0000 mg | Freq: Once | INTRAMUSCULAR | Status: AC
  Administered 2018-02-27: 4 mg via INTRAVENOUS
  Filled 2018-02-27: qty 2

## 2018-02-27 MED ORDER — SODIUM CHLORIDE 0.9 % IV SOLN: 2.0000 g | Freq: Once | INTRAVENOUS | Status: DC

## 2018-02-27 MED ORDER — IOPAMIDOL (ISOVUE-300) INJECTION 61%
INTRAVENOUS | Status: AC
  Filled 2018-02-27: qty 100

## 2018-02-27 MED ORDER — OXYCODONE-ACETAMINOPHEN 5-325 MG PO TABS: 1.0000 | ORAL_TABLET | ORAL | 0 refills | Status: DC | PRN

## 2018-02-27 MED ORDER — ACETAMINOPHEN 500 MG PO TABS
1000.0000 mg | ORAL_TABLET | Freq: Once | ORAL | Status: AC
  Administered 2018-02-27: 1000 mg via ORAL
  Filled 2018-02-27: qty 2

## 2018-02-27 MED ORDER — SODIUM CHLORIDE 0.9 % IV SOLN
Freq: Once | INTRAVENOUS | Status: AC
  Administered 2018-02-27: 22:00:00 via INTRAVENOUS

## 2018-02-27 MED ORDER — AMOXICILLIN-POT CLAVULANATE 875-125 MG PO TABS: 1.0000 | ORAL_TABLET | Freq: Two times a day (BID) | ORAL | 0 refills | Status: DC

## 2018-02-27 MED ORDER — ONDANSETRON 4 MG PO TBDP: 4.0000 mg | ORAL_TABLET | Freq: Three times a day (TID) | ORAL | 0 refills | Status: DC | PRN

## 2018-02-27 MED ORDER — SODIUM CHLORIDE 0.9 % IV BOLUS
1000.0000 mL | Freq: Once | INTRAVENOUS | Status: AC
  Administered 2018-02-27: 1000 mL via INTRAVENOUS

## 2018-02-27 MED ORDER — HYDROMORPHONE HCL 1 MG/ML IJ SOLN
1.0000 mg | Freq: Once | INTRAMUSCULAR | Status: AC
  Administered 2018-02-27: 1 mg via INTRAVENOUS
  Filled 2018-02-27: qty 1

## 2018-02-27 MED ORDER — METRONIDAZOLE IN NACL 5-0.79 MG/ML-% IV SOLN: 500.0000 mg | Freq: Once | INTRAVENOUS | Status: DC

## 2018-02-27 MED ORDER — PIPERACILLIN-TAZOBACTAM 3.375 G IVPB 30 MIN
3.3750 g | Freq: Once | INTRAVENOUS | Status: AC
  Administered 2018-02-28: 3.375 g via INTRAVENOUS
  Filled 2018-02-27: qty 50

## 2018-02-27 MED ORDER — SODIUM CHLORIDE 0.9 % IV BOLUS
1000.0000 mL | Freq: Once | INTRAVENOUS | Status: AC
  Administered 2018-02-28: 1000 mL via INTRAVENOUS

## 2018-02-27 MED ORDER — OXYCODONE-ACETAMINOPHEN 5-325 MG PO TABS
2.0000 | ORAL_TABLET | Freq: Once | ORAL | Status: AC
  Administered 2018-02-28: 2 via ORAL
  Filled 2018-02-27: qty 2

## 2018-02-27 NOTE — ED Provider Notes (Signed)
[facility] DEPT Provider Note   CSN: [id number] Arrival date & time: 02/27/18  1750     History   Chief Complaint Chief Complaint  Patient presents with  . Abdominal Pain  . Loss of Consciousness  . Headache    HPI [name] [name] is a 37 y.o. female.  HPI 37 year old female with history as below including history of colon perforation, carcinoid tumor of the appendix, here with abdominal pain.  The patient states that for the last several days, she is had progressively worsening aching, throbbing, but intermittently sharp and stabbing left lower quadrant pain.  She said associated fever and chills.  She is had significantly more loose bowel meds than usual yesterday, up to 7-8 throughout the day.  Denies any blood in her stools.  Denies any other abdominal pain, though it does occasionally radiate towards her umbilicus.  No nausea or vomiting.  No flank pain.  No urinary symptoms.  No vaginal bleeding or discharge.  Past Medical History:  Diagnosis Date  . Anxiety   . Asthma    childhood exercise induced only  . Cancer ([place])    appendix  . Cholecystitis chronic, acute   . Depression   . Gallstones   . Headache(784.0)    migraines  . Heart murmur    no indications for 7-33yrs per pt.    Patient Active Problem List   Diagnosis Date Noted  . Hypokalemia 02/28/2018  . UTI (urinary tract infection) 02/28/2018  . Pelvic abscess in female 02/28/2018  . CIN III (cervical intraepithelial neoplasia grade III) with severe dysplasia 11/25/2017  . Ventral hernia without obstruction or gangrene 07/15/2017  . Insomnia, unspecified 02/18/2017  . Moderate major depression ([place]) 01/07/2017  . Gallstone pancreatitis 12/20/2012  . Colon perforation ([place]) 12/09/2012  . Carcinoid tumor of appendix, malignant ([place]) 12/09/2012  . Class 3 severe obesity with body mass index (BMI) of 45.0 to 49.9 in adult ([place]) 08/08/2007  . Migraine headache 02/28/2007     Past Surgical History:  Procedure Laterality Date  . APPENDECTOMY    . CESAREAN SECTION N/A 11/03/2012   Procedure: PRIMARY CESAREAN SECTION;  Surgeon: Melina Schools, MD;  Location: [facility];  Service: Obstetrics;  Laterality: N/A;  . CHOLECYSTECTOMY N/A 12/23/2012   Procedure: LAPAROSCOPIC CHOLECYSTECTOMY WITH INTRAOPERATIVE CHOLANGIOGRAM;  Surgeon: Pedro Earls, MD;  Location: [facility];  Service: General;  Laterality: N/A;  Laparoscopic Cholecystectomy with IOC  . COLON SURGERY    . ERCP N/A 12/22/2012   Procedure: ENDOSCOPIC RETROGRADE CHOLANGIOPANCREATOGRAPHY (ERCP);  Surgeon: Gatha Mayer, MD;  Location: [facility];  Service: Endoscopy;  Laterality: N/A;  . INSERTION OF MESH N/A 07/15/2017   Procedure: INSERTION OF MESH;  Surgeon: Jovita Kussmaul, MD;  Location: [facility];  Service: General;  Laterality: N/A;  . LAPAROTOMY N/A 11/08/2012   Procedure: laparoscopic exploration and EXPLORATORY LAPAROTOMY;  Surgeon: Merrie Roof, MD;  Location: [facility];  Service: General;  Laterality: N/A;  . VENTRAL HERNIA REPAIR N/A 07/15/2017   Procedure: LAPAROSCOPIC ASSISTED VENTRAL HERNIA REPAIR;  Surgeon: Jovita Kussmaul, MD;  Location: [facility];  Service: General;  Laterality: N/A;     OB History    Gravida  3   Para  3   Term  1   Preterm      AB      Living  3     SAB      TAB      Ectopic  Multiple      Live Births  1            Home Medications    Prior to Admission medications   Medication Sig Start Date End Date Taking? Authorizing Provider  amitriptyline (ELAVIL) 25 MG tablet Take 0.5 tablets (12.5 mg total) by mouth at bedtime. 09/11/17  Yes Martinique, Betty G, MD  ibuprofen (ADVIL,MOTRIN) 200 MG tablet Take 400 mg by mouth every 6 (six) hours as needed for headache or moderate pain.   Yes [provider]  norgestimate-ethinyl estradiol (ORTHO-CYCLEN, 28,) 0.25-35 MG-MCG tablet Take 1 tablet by mouth daily. 06/18/17  Yes Martinique, Betty G, MD  venlafaxine XR  (EFFEXOR XR) 75 MG 24 hr capsule Take 1 capsule (75 mg total) by mouth daily with breakfast. 11/25/17  Yes Martinique, Betty G, MD  amoxicillin-clavulanate (AUGMENTIN) 875-125 MG tablet Take 1 tablet by mouth every 12 (twelve) hours for 10 days. 02/27/18 03/09/18  Duffy Bruce, MD  HYDROcodone-acetaminophen (NORCO/VICODIN) 5-325 MG tablet Take 1-2 tablets by mouth every 6 (six) hours as needed for moderate pain. Patient not taking: Reported on 02/27/2018 07/18/17   Autumn Messing III, MD  ondansetron (ZOFRAN ODT) 4 MG disintegrating tablet Take 1 tablet (4 mg total) by mouth every 8 (eight) hours as needed for nausea or vomiting. 02/27/18   Duffy Bruce, MD  oxyCODONE-acetaminophen (PERCOCET/ROXICET) 5-325 MG tablet Take 1-2 tablets by mouth every 4 (four) hours as needed for moderate pain or severe pain. 02/27/18   Duffy Bruce, MD    Family History Family History  Problem Relation Age of Onset  . Cancer Father        Leukemia  . ADD / ADHD Unknown        family hx  . Breast cancer Unknown        fhx  . Mental illness Unknown        fhx  . Heart disease Unknown        fhx  . Hypertension Unknown        fhx    Social History Social History   Tobacco Use  . Smoking status: Never Smoker  . Smokeless tobacco: Never Used  Substance Use Topics  . Alcohol use: No  . Drug use: No     Allergies   Patient has no known allergies.   Review of Systems Review of Systems  Constitutional: Positive for chills and fatigue. Negative for fever.  HENT: Negative for congestion and rhinorrhea.   Eyes: Negative for visual disturbance.  Respiratory: Negative for cough, shortness of breath and wheezing.   Cardiovascular: Negative for chest pain and leg swelling.  Gastrointestinal: Positive for abdominal pain, diarrhea and nausea. Negative for vomiting.  Genitourinary: Negative for dysuria and flank pain.  Musculoskeletal: Negative for neck pain and neck stiffness.  Skin: Negative for rash and  wound.  Allergic/Immunologic: Negative for immunocompromised state.  Neurological: Positive for weakness. Negative for syncope and headaches.  All other systems reviewed and are negative.    Physical Exam Updated Vital Signs BP 109/67 (BP Location: Left Arm)   Pulse 86   Temp 98.7 F (37.1 C) (Oral)   Resp 15   Ht 5\' 4"  (1.626 m)   Wt 129.2 kg   LMP 02/09/2018 (Approximate)   SpO2 99%   BMI 48.89 kg/m   Physical Exam  Constitutional: She is oriented to person, place, and time. She appears well-developed and well-nourished. No distress.  HENT:  Head: Normocephalic and atraumatic.  Eyes: Conjunctivae  are normal.  Neck: Neck supple.  Cardiovascular: Normal rate, regular rhythm and normal heart sounds. Exam reveals no friction rub.  No murmur heard. Pulmonary/Chest: Effort normal and breath sounds normal. No respiratory distress. She has no wheezes. She has no rales.  Abdominal: She exhibits no distension. There is tenderness in the suprapubic area and left lower quadrant. There is guarding. There is no rigidity and no rebound.  Musculoskeletal: She exhibits no edema.  Neurological: She is alert and oriented to person, place, and time. She exhibits normal muscle tone.  Skin: Skin is warm. Capillary refill takes less than 2 seconds.  Psychiatric: She has a normal mood and affect.  Nursing note and vitals reviewed.    ED Treatments / Results  Labs (all labs ordered are listed, but only abnormal results are displayed) Labs Reviewed  WET PREP, GENITAL - Abnormal; Notable for the following components:      Result Value   Clue Cells Wet Prep HPF POC PRESENT (*)    WBC, Wet Prep HPF POC FEW (*)    All other components within normal limits  BASIC METABOLIC PANEL - Abnormal; Notable for the following components:   Potassium 3.4 (*)    Calcium 8.8 (*)    All other components within normal limits  URINALYSIS, ROUTINE W REFLEX MICROSCOPIC - Abnormal; Notable for the following  components:   Color, Urine AMBER (*)    APPearance HAZY (*)    Hgb urine dipstick LARGE (*)    Ketones, ur 5 (*)    Nitrite POSITIVE (*)    Leukocytes, UA SMALL (*)    Bacteria, UA MANY (*)    Crystals PRESENT (*)    All other components within normal limits  HEPATIC FUNCTION PANEL - Abnormal; Notable for the following components:   Albumin 3.1 (*)    AST 14 (*)    Alkaline Phosphatase 37 (*)    All other components within normal limits  URINE CULTURE  CBC  LIPASE, BLOOD  CBG MONITORING, ED  CBG MONITORING, ED  I-STAT BETA HCG BLOOD, ED ([facility], [facility], [facility] ONLY)  I-STAT CG4 LACTIC ACID, ED  I-STAT CG4 LACTIC ACID, ED  GC/CHLAMYDIA PROBE AMP ([place]) NOT AT [facility]    EKG None  Radiology Ct Abdomen Pelvis W Contrast  Result Date: 02/27/2018 CLINICAL DATA:  37 year old female with nausea vomiting and abdominal pain. EXAM: CT ABDOMEN AND PELVIS WITH CONTRAST TECHNIQUE: Multidetector CT imaging of the abdomen and pelvis was performed using the standard protocol following bolus administration of intravenous contrast. CONTRAST:  120mL ISOVUE-300 IOPAMIDOL (ISOVUE-300) INJECTION 61% COMPARISON:  CT of the abdomen pelvis dated 09/28/2016 FINDINGS: Lower chest: The visualized lung bases are clear. No intra-abdominal free air.  Small free fluid within the pelvis. Hepatobiliary: The liver is unremarkable. No intrahepatic biliary ductal dilatation. Cholecystectomy. The common bile duct measures 13 mm. No calcified stone noted in the central CBD. Pancreas: Unremarkable. No pancreatic ductal dilatation or surrounding inflammatory changes. Spleen: There is a 4.5 cm inferior splenic cyst. Adrenals/Urinary Tract: The adrenal glands are unremarkable. There is no hydronephrosis on either side. The visualized ureters and urinary bladder appear unremarkable. Stomach/Bowel: There is sigmoid diverticulosis without active inflammatory changes compatible with acute diverticulitis. There is loss of fat plane between  the sigmoid colon and the left ovary which may represent a degree of adhesion. There is a somewhat tubular appearing low attenuating structure adjacent to the sigmoid colon and in the region of the left ovary. Although this may  represent ovarian follicle or normal ovarian tissue, an abscess is not excluded. This measures approximately 3.2 x 2.7 cm. Vascular/Lymphatic: No significant vascular findings are present. No enlarged abdominal or pelvic lymph nodes. Reproductive: The uterus is anteverted and grossly unremarkable. There is mildly enlargement of the left ovary with inflammatory changes secondary to inflammation of this sigmoid diverticula. Low attenuating structure in the region of the left ovary, may represent follicle or developing abscess. Other: Midline vertical anterior pelvic wall incisional scar. Musculoskeletal: No acute or significant osseous findings. IMPRESSION: Acute sigmoid diverticulitis. Fluid attenuating structure adjacent to the sigmoid colon in the region of the left ovary may represent ovarian follicle versus developing abscess. Clinical correlation is recommended. Electronically Signed   By: Anner Crete M.D.   On: 02/27/2018 23:01    Procedures Procedures (including critical care time)  Medications Ordered in ED Medications  iopamidol (ISOVUE-300) 61 % injection (has no administration in time range)  sodium chloride 0.9 % bolus 1,000 mL (1,000 mLs Intravenous Not Given 02/27/18 2356)  piperacillin-tazobactam (ZOSYN) IVPB 3.375 g (3.375 g Intravenous New Bag/Given 02/28/18 0003)  0.9 %  sodium chloride infusion ( Intravenous New Bag/Given 02/27/18 2141)  sodium chloride 0.9 % bolus 1,000 mL (0 mLs Intravenous Stopped 02/27/18 2355)  HYDROmorphone (DILAUDID) injection 1 mg (1 mg Intravenous Given 02/27/18 2136)  ondansetron (ZOFRAN) injection 4 mg (4 mg Intravenous Given 02/27/18 2136)  acetaminophen (TYLENOL) tablet 1,000 mg (1,000 mg Oral Given 02/27/18 2136)  iopamidol  (ISOVUE-300) 61 % injection 100 mL (100 mLs Intravenous Contrast Given 02/27/18 2217)  oxyCODONE-acetaminophen (PERCOCET/ROXICET) 5-325 MG per tablet 2 tablet (2 tablets Oral Given 02/28/18 0004)     Initial Impression / Assessment and Plan / ED Course  I have reviewed the triage vital signs and the nursing notes.  Pertinent labs & imaging results that were available during my care of the patient were reviewed by me and considered in my medical decision making (see chart for details).     37 yo F here with LLQ abd pain, with h/o diverticulitis. Pt tachycardic, borderline febrile on arrival. Labs show normal WBC, normal LA, normal LFTs and renal function. Given her complex history, CT scan obtained and shows acute sigmoid diverticulitis, ? Early abscess versus ovarian cyst. On pelvic, she has no evidence to suggest PID. I discussed findings with Radiology - while her menstrual period could explain a cyst, cannot r/o early abscess from diverticulitis. Recommends U/S, will start IV ABX. Given her borderline hypotension, acute diverticulitis, will admit.  D/w Dr. Jonelle Sidle who will admit. He's requested consult to surgery - consult placed. Dr. Marlou Starks on call, who performed ventral hernia repair in 02/19. He is aware, surgery to see pt in AM if u/S does not show cyst/alternative diagnosis.  Final Clinical Impressions(s) / ED Diagnoses   Final diagnoses:  Diverticulitis  Cyst of left ovary    ED Discharge Orders         Ordered    amoxicillin-clavulanate (AUGMENTIN) 875-125 MG tablet  Every 12 hours     02/27/18 2349    ondansetron (ZOFRAN ODT) 4 MG disintegrating tablet  Every 8 hours PRN     02/27/18 2349    oxyCODONE-acetaminophen (PERCOCET/ROXICET) 5-325 MG tablet  Every 4 hours PRN     02/27/18 2349           Duffy Bruce, MD 02/28/18 0020

## 2018-02-27 NOTE — ED Triage Notes (Signed)
Patient c/o left lower abdominal pain and nausea since yesterday.Patient also c/o vomiting today.  Patient also reports a headache that started today after a syncopal episode this AM.

## 2018-02-28 ENCOUNTER — Inpatient Hospital Stay (HOSPITAL_COMMUNITY): Payer: Medicaid Other

## 2018-02-28 DIAGNOSIS — N390 Urinary tract infection, site not specified: Secondary | ICD-10-CM | POA: Diagnosis present

## 2018-02-28 DIAGNOSIS — F321 Major depressive disorder, single episode, moderate: Secondary | ICD-10-CM | POA: Diagnosis not present

## 2018-02-28 DIAGNOSIS — R000 Tachycardia, unspecified: Secondary | ICD-10-CM | POA: Diagnosis not present

## 2018-02-28 DIAGNOSIS — D069 Carcinoma in situ of cervix, unspecified: Secondary | ICD-10-CM | POA: Diagnosis present

## 2018-02-28 DIAGNOSIS — N739 Female pelvic inflammatory disease, unspecified: Secondary | ICD-10-CM

## 2018-02-28 DIAGNOSIS — E876 Hypokalemia: Secondary | ICD-10-CM | POA: Diagnosis not present

## 2018-02-28 DIAGNOSIS — Z8589 Personal history of malignant neoplasm of other organs and systems: Secondary | ICD-10-CM | POA: Diagnosis not present

## 2018-02-28 DIAGNOSIS — N83292 Other ovarian cyst, left side: Secondary | ICD-10-CM | POA: Diagnosis not present

## 2018-02-28 DIAGNOSIS — Z806 Family history of leukemia: Secondary | ICD-10-CM | POA: Diagnosis not present

## 2018-02-28 DIAGNOSIS — D251 Intramural leiomyoma of uterus: Secondary | ICD-10-CM | POA: Diagnosis not present

## 2018-02-28 DIAGNOSIS — K5792 Diverticulitis of intestine, part unspecified, without perforation or abscess without bleeding: Secondary | ICD-10-CM | POA: Diagnosis not present

## 2018-02-28 DIAGNOSIS — Z793 Long term (current) use of hormonal contraceptives: Secondary | ICD-10-CM | POA: Diagnosis not present

## 2018-02-28 DIAGNOSIS — N8300 Follicular cyst of ovary, unspecified side: Secondary | ICD-10-CM | POA: Diagnosis present

## 2018-02-28 DIAGNOSIS — F419 Anxiety disorder, unspecified: Secondary | ICD-10-CM | POA: Diagnosis present

## 2018-02-28 DIAGNOSIS — N760 Acute vaginitis: Secondary | ICD-10-CM | POA: Diagnosis not present

## 2018-02-28 DIAGNOSIS — R3989 Other symptoms and signs involving the genitourinary system: Secondary | ICD-10-CM | POA: Diagnosis not present

## 2018-02-28 DIAGNOSIS — Z791 Long term (current) use of non-steroidal anti-inflammatories (NSAID): Secondary | ICD-10-CM | POA: Diagnosis not present

## 2018-02-28 DIAGNOSIS — N83202 Unspecified ovarian cyst, left side: Secondary | ICD-10-CM | POA: Diagnosis not present

## 2018-02-28 DIAGNOSIS — Z79899 Other long term (current) drug therapy: Secondary | ICD-10-CM | POA: Diagnosis not present

## 2018-02-28 DIAGNOSIS — G4700 Insomnia, unspecified: Secondary | ICD-10-CM | POA: Diagnosis present

## 2018-02-28 DIAGNOSIS — D649 Anemia, unspecified: Secondary | ICD-10-CM | POA: Diagnosis not present

## 2018-02-28 DIAGNOSIS — K5732 Diverticulitis of large intestine without perforation or abscess without bleeding: Secondary | ICD-10-CM | POA: Diagnosis not present

## 2018-02-28 DIAGNOSIS — Z6841 Body Mass Index (BMI) 40.0 and over, adult: Secondary | ICD-10-CM | POA: Diagnosis not present

## 2018-02-28 DIAGNOSIS — Z98891 History of uterine scar from previous surgery: Secondary | ICD-10-CM | POA: Diagnosis not present

## 2018-02-28 DIAGNOSIS — Z9049 Acquired absence of other specified parts of digestive tract: Secondary | ICD-10-CM | POA: Diagnosis not present

## 2018-02-28 DIAGNOSIS — C7A020 Malignant carcinoid tumor of the appendix: Secondary | ICD-10-CM | POA: Diagnosis not present

## 2018-02-28 DIAGNOSIS — B9689 Other specified bacterial agents as the cause of diseases classified elsewhere: Secondary | ICD-10-CM | POA: Diagnosis not present

## 2018-02-28 DIAGNOSIS — Z713 Dietary counseling and surveillance: Secondary | ICD-10-CM | POA: Diagnosis not present

## 2018-02-28 LAB — WET PREP, GENITAL
Sperm: NONE SEEN
Trich, Wet Prep: NONE SEEN
Yeast Wet Prep HPF POC: NONE SEEN

## 2018-02-28 LAB — COMPREHENSIVE METABOLIC PANEL
ALT: 14 U/L (ref 0–44)
ANION GAP: 7 (ref 5–15)
AST: 12 U/L — ABNORMAL LOW (ref 15–41)
Albumin: 2.7 g/dL — ABNORMAL LOW (ref 3.5–5.0)
Alkaline Phosphatase: 34 U/L — ABNORMAL LOW (ref 38–126)
BUN: 7 mg/dL (ref 6–20)
CHLORIDE: 109 mmol/L (ref 98–111)
CO2: 24 mmol/L (ref 22–32)
Calcium: 7.7 mg/dL — ABNORMAL LOW (ref 8.9–10.3)
Creatinine, Ser: 0.61 mg/dL (ref 0.44–1.00)
Glucose, Bld: 93 mg/dL (ref 70–99)
POTASSIUM: 3.1 mmol/L — AB (ref 3.5–5.1)
Sodium: 140 mmol/L (ref 135–145)
TOTAL PROTEIN: 6.3 g/dL — AB (ref 6.5–8.1)
Total Bilirubin: 0.8 mg/dL (ref 0.3–1.2)

## 2018-02-28 LAB — CBC
HEMATOCRIT: 33.7 % — AB (ref 36.0–46.0)
HEMOGLOBIN: 11.2 g/dL — AB (ref 12.0–15.0)
MCH: 30.1 pg (ref 26.0–34.0)
MCHC: 33.2 g/dL (ref 30.0–36.0)
MCV: 90.6 fL (ref 78.0–100.0)
Platelets: 223 10*3/uL (ref 150–400)
RBC: 3.72 MIL/uL — AB (ref 3.87–5.11)
RDW: 13.9 % (ref 11.5–15.5)
WBC: 6.6 10*3/uL (ref 4.0–10.5)

## 2018-02-28 LAB — GC/CHLAMYDIA PROBE AMP (~~LOC~~) NOT AT ARMC
CHLAMYDIA, DNA PROBE: NEGATIVE
NEISSERIA GONORRHEA: NEGATIVE

## 2018-02-28 LAB — GLUCOSE, CAPILLARY: Glucose-Capillary: 98 mg/dL (ref 70–99)

## 2018-02-28 MED ORDER — DEXTROSE-NACL 5-0.9 % IV SOLN
INTRAVENOUS | Status: DC
  Administered 2018-02-28 – 2018-03-02 (×5): via INTRAVENOUS

## 2018-02-28 MED ORDER — AMITRIPTYLINE HCL 25 MG PO TABS
12.5000 mg | ORAL_TABLET | Freq: Every day | ORAL | Status: DC
  Administered 2018-02-28 – 2018-03-02 (×3): 12.5 mg via ORAL
  Filled 2018-02-28 (×3): qty 1

## 2018-02-28 MED ORDER — POTASSIUM CHLORIDE 10 MEQ/100ML IV SOLN
10.0000 meq | INTRAVENOUS | Status: AC
  Administered 2018-02-28: 10 meq via INTRAVENOUS
  Filled 2018-02-28 (×2): qty 100

## 2018-02-28 MED ORDER — VENLAFAXINE HCL ER 75 MG PO CP24
75.0000 mg | ORAL_CAPSULE | Freq: Every day | ORAL | Status: DC
  Administered 2018-02-28 – 2018-03-03 (×4): 75 mg via ORAL
  Filled 2018-02-28 (×4): qty 1

## 2018-02-28 MED ORDER — ONDANSETRON HCL 4 MG PO TABS: 4.0000 mg | ORAL_TABLET | Freq: Four times a day (QID) | ORAL | Status: DC | PRN

## 2018-02-28 MED ORDER — POTASSIUM CHLORIDE CRYS ER 20 MEQ PO TBCR
40.0000 meq | EXTENDED_RELEASE_TABLET | Freq: Two times a day (BID) | ORAL | Status: AC
  Administered 2018-02-28 – 2018-03-01 (×2): 40 meq via ORAL
  Filled 2018-02-28 (×2): qty 2

## 2018-02-28 MED ORDER — SODIUM CHLORIDE 0.9 % IV SOLN
1.0000 g | Freq: Three times a day (TID) | INTRAVENOUS | Status: DC
  Administered 2018-02-28 – 2018-03-01 (×5): 1 g via INTRAVENOUS
  Filled 2018-02-28 (×6): qty 1

## 2018-02-28 MED ORDER — NORGESTIMATE-ETH ESTRADIOL 0.25-35 MG-MCG PO TABS: 1.0000 | ORAL_TABLET | Freq: Every day | ORAL | Status: DC

## 2018-02-28 MED ORDER — IBUPROFEN 200 MG PO TABS
400.0000 mg | ORAL_TABLET | Freq: Four times a day (QID) | ORAL | Status: DC | PRN
  Administered 2018-02-28 – 2018-03-02 (×3): 400 mg via ORAL
  Filled 2018-02-28 (×4): qty 2

## 2018-02-28 MED ORDER — ONDANSETRON HCL 4 MG/2ML IJ SOLN: 4.0000 mg | Freq: Four times a day (QID) | INTRAMUSCULAR | Status: DC | PRN

## 2018-02-28 MED ORDER — HEPARIN SODIUM (PORCINE) 5000 UNIT/ML IJ SOLN
5000.0000 [IU] | Freq: Three times a day (TID) | INTRAMUSCULAR | Status: DC
  Administered 2018-02-28 – 2018-03-03 (×10): 5000 [IU] via SUBCUTANEOUS
  Filled 2018-02-28 (×10): qty 1

## 2018-02-28 MED ORDER — MORPHINE SULFATE (PF) 4 MG/ML IV SOLN
4.0000 mg | INTRAVENOUS | Status: DC | PRN
  Administered 2018-02-28: 4 mg via INTRAVENOUS
  Filled 2018-02-28: qty 1

## 2018-02-28 MED ORDER — KETOROLAC TROMETHAMINE 30 MG/ML IJ SOLN: 30.0000 mg | Freq: Four times a day (QID) | INTRAMUSCULAR | Status: DC | PRN

## 2018-02-28 NOTE — Progress Notes (Signed)
Pharmacy Antibiotic Note  [name] [name] is a 37 y.o. female admitted on 02/27/2018 with Intra-abdominal infection.  Pharmacy has been consulted for Meropenem dosing.  Plan: Meropenem 1gm iv q8hr  Height: 5\' 4"  (162.6 cm) Weight: 284 lb 12.8 oz (129.2 kg) IBW/kg (Calculated) : 54.7  Temp (24hrs), Avg:99.3 F (37.4 C), Min:98.7 F (37.1 C), Max:99.6 F (37.6 C)  Recent Labs  Lab 02/27/18 1918 02/27/18 2125  WBC 9.8  --   CREATININE 0.69  --   LATICACIDVEN  --  1.22    Estimated Creatinine Clearance: 128.4 mL/min (by C-G formula based on SCr of 0.69 mg/dL).    No Known Allergies  Antimicrobials this admission: Meropenem 02/28/2018 >>  Dose adjustments this admission: -  Microbiology results: -  Thank you for allowing pharmacy to be a part of this patient's care.  Nani Skillern Crowford 02/28/2018 12:50 AM

## 2018-02-28 NOTE — ED Notes (Signed)
ED TO INPATIENT HANDOFF REPORT  Name/Age/Gender [name] [name] 37 y.o. female  Code Status Code Status History    Date Active Date Inactive Code Status Order ID Comments User Context   07/15/2017 1310 07/18/2017 2154 Full Code [id number]  Jovita Kussmaul, MD Inpatient   12/20/2012 2145 12/24/2012 1657 Full Code [id number]  Orvan Falconer, MD Inpatient      Home/SNF/Other Home  Chief Complaint Lower abdominal pain  Level of Care/Admitting Diagnosis ED Disposition    ED Disposition Condition [facility] Area: [facility] [[id number]]  Level of Care: Med-Surg [16]  Diagnosis: Pelvic abscess in female [[id number]]  Admitting Physician: Elwyn Reach [[id number]]  Attending Physician: Elwyn Reach [[id number]]  Estimated length of stay: past midnight tomorrow  Certification:: I certify this patient will need inpatient services for at least 2 midnights  PT Class (Do Not Modify): Inpatient [101]  PT Acc Code (Do Not Modify): Private [1]       Medical History Past Medical History:  Diagnosis Date  . Anxiety   . Asthma    childhood exercise induced only  . 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 Cholecystitis chronic, acute   . Depression   . Gallstones   . Headache(784.0)    migraines  . Heart murmur    no indications for 7-64yr per pt.    Allergies No Known Allergies  IV Location/Drains/Wounds Patient Lines/Drains/Airways Status   Active Line/Drains/Airways    Name:   Placement date:   Placement time:   Site:   Days:   Peripheral IV 02/27/18 Right Hand   02/27/18    2119    Hand   1   Closed System Drain 1 Left LLQ Bulb (JP) 19 Fr.   07/15/17    1032    LLQ   228   Incision (Closed) 07/15/17 Abdomen Other (Comment)   07/15/17    1022     228   Incision - 3 Ports Abdomen 1: Left;Upper 2: Left;Lower 3: Right;Upper   07/15/17    1054     228          Labs/Imaging Results for orders placed or performed during the hospital encounter of 02/27/18 (from the past  48 hour(s))  CBG monitoring, ED     Status: None   Collection Time: 02/27/18  6:37 PM  Result Value Ref Range   Glucose-Capillary 85 70 - 99 mg/dL  Basic metabolic panel     Status: Abnormal   Collection Time: 02/27/18  7:18 PM  Result Value Ref Range   Sodium 143 135 - 145 mmol/L   Potassium 3.4 (L) 3.5 - 5.1 mmol/L   Chloride 108 98 - 111 mmol/L   CO2 26 22 - 32 mmol/L   Glucose, Bld 95 70 - 99 mg/dL   BUN 8 6 - 20 mg/dL   Creatinine, Ser 0.69 0.44 - 1.00 mg/dL   Calcium 8.8 (L) 8.9 - 10.3 mg/dL   GFR calc non Af Amer >60 >60 mL/min   GFR calc Af Amer >60 >60 mL/min    Comment: (NOTE) The eGFR has been calculated using the CKD EPI equation. This calculation has not been validated in all clinical situations. eGFR's persistently <60 mL/min signify possible Chronic Kidney Disease.    Anion gap 9 5 - 15    Comment: Performed [facility] [address] [place] [id number] CBC     Status: None  Collection Time: 02/27/18  7:18 PM  Result Value Ref Range   WBC 9.8 4.0 - 10.5 K/uL   RBC 4.11 3.87 - 5.11 MIL/uL   Hemoglobin 12.5 12.0 - 15.0 g/dL   HCT 37.1 36.0 - 46.0 %   MCV 90.3 78.0 - 100.0 fL   MCH 30.4 26.0 - 34.0 pg   MCHC 33.7 30.0 - 36.0 g/dL   RDW 13.7 11.5 - 15.5 %   Platelets 286 150 - 400 K/uL    Comment: Performed at [facility], [place] [address] [place], [place] [address]  Hepatic function panel     Status: Abnormal   Collection Time: 02/27/18  7:25 PM  Result Value Ref Range   Total Protein 7.2 6.5 - 8.1 g/dL   Albumin 3.1 (L) 3.5 - 5.0 g/dL   AST 14 (L) 15 - 41 U/L   ALT 16 0 - 44 U/L   Alkaline Phosphatase 37 (L) 38 - 126 U/L   Total Bilirubin 0.6 0.3 - 1.2 mg/dL   Bilirubin, Direct 0.1 0.0 - 0.2 mg/dL   Indirect Bilirubin 0.5 0.3 - 0.9 mg/dL    Comment: Performed at [facility], [place] [address] [place], [place] [address]  Lipase, blood     Status: None   Collection Time: 02/27/18   7:25 PM  Result Value Ref Range   Lipase 30 11 - 51 U/L    Comment: Performed at [facility], [place] [address], [place], [place] [address]  I-Stat beta hCG blood, ED     Status: None   Collection Time: 02/27/18  7:26 PM  Result Value Ref Range   I-stat hCG, quantitative <5.0 <5 mIU/mL   Comment 3            Comment:   GEST. AGE      CONC.  (mIU/mL)   <=1 WEEK        5 - 50     2 WEEKS       50 - 500     3 WEEKS       100 - 10,000     4 WEEKS     1,000 - 30,000        FEMALE AND NON-PREGNANT FEMALE:     LESS THAN 5 mIU/mL   I-Stat CG4 Lactic Acid, ED     Status: None   Collection Time: 02/27/18  9:25 PM  Result Value Ref Range   Lactic Acid, Venous 1.22 0.5 - 1.9 mmol/L  Urinalysis, Routine w reflex microscopic     Status: Abnormal   Collection Time: 02/27/18  9:41 PM  Result Value Ref Range   Color, Urine AMBER (A) YELLOW    Comment: BIOCHEMICALS MAY BE AFFECTED BY COLOR   APPearance HAZY (A) CLEAR   Specific Gravity, Urine 1.020 1.005 - 1.030   pH 5.0 5.0 - 8.0   Glucose, UA NEGATIVE NEGATIVE mg/dL   Hgb urine dipstick LARGE (A) NEGATIVE   Bilirubin Urine NEGATIVE NEGATIVE   Ketones, ur 5 (A) NEGATIVE mg/dL   Protein, ur NEGATIVE NEGATIVE mg/dL   Nitrite POSITIVE (A) NEGATIVE   Leukocytes, UA SMALL (A) NEGATIVE   RBC / HPF 0-5 0 - 5 RBC/hpf   WBC, UA 21-50 0 - 5 WBC/hpf   Bacteria, UA MANY (A) NONE SEEN   Squamous Epithelial / LPF 0-5 0 - 5   Mucus PRESENT    Crystals PRESENT (A) NEGATIVE    Comment: Performed at  [facility], [place] [address], [place], [place] [address]  Wet prep, genital     Status: Abnormal   Collection Time: 02/27/18 11:35 PM  Result Value Ref Range   Yeast Wet Prep HPF POC NONE SEEN NONE SEEN   Trich, Wet Prep NONE SEEN NONE SEEN   Clue Cells Wet Prep HPF POC PRESENT (A) NONE SEEN   WBC, Wet Prep HPF POC FEW (A) NONE SEEN   Sperm NONE SEEN     Comment: Performed at [facility], [place]  [address] [place], [place] [address]   US Transvaginal Non-ob  Result Date: 02/28/2018 CLINICAL DATA:  Initial evaluation for acute left lower quadrant abdominal pain. Abnormal CT. EXAM: TRANSABDOMINAL AND TRANSVAGINAL ULTRASOUND OF PELVIS DOPPLER ULTRASOUND OF OVARIES TECHNIQUE: Both transabdominal and transvaginal ultrasound examinations of the pelvis were performed. Transabdominal technique was performed for global imaging of the pelvis including uterus, ovaries, adnexal regions, and pelvic cul-de-sac. It was necessary to proceed with endovaginal exam following the transabdominal exam to visualize the uterus, endometrium, and ovaries. Color and duplex Doppler ultrasound was utilized to evaluate blood flow to the ovaries. COMPARISON:  Prior CT from 02/27/2018. FINDINGS: Uterus Measurements: 8.1 x 4.9 x 5.4 cm. Intramural fundal fibroid measuring 1.3 x 1.5 x 1.2 cm noted. Endometrium Thickness: 7.5 mm.  No focal abnormality visualized. Right ovary Measurements: 3.2 x 2.5 x 2.1 cm. Normal appearance/no adnexal mass. Left ovary Measurements: 3.6 x 2.6 x 2.4 cm. 1.1 x 1.0 x 1.2 cm predominantly simple cyst, favored to reflect a small physiologic follicular dominant follicle. Additional smaller adjacent cyst also felt to be most consistent with a small normal physiologic follicle. No other definite discrete collection or abscess identified. Pulsed Doppler evaluation of both ovaries demonstrates normal low-resistance arterial and venous waveforms. Other findings No abnormal free fluid. IMPRESSION: 1. Small left ovarian cysts measuring up to 1.2 cm, felt to be most consistent with normal physiologic follicular cysts. 2. No other discrete collection or abscess associated with previously identified sigmoid diverticulitis identified. 3. No evidence for ovarian torsion. 4. 1.5 cm fundal fibroid. Electronically Signed   By: Jeannine Boga M.D.   On: 02/28/2018 02:13   US Pelvis Complete  Result Date:  02/28/2018 CLINICAL DATA:  Initial evaluation for acute left lower quadrant abdominal pain. Abnormal CT. EXAM: TRANSABDOMINAL AND TRANSVAGINAL ULTRASOUND OF PELVIS DOPPLER ULTRASOUND OF OVARIES TECHNIQUE: Both transabdominal and transvaginal ultrasound examinations of the pelvis were performed. Transabdominal technique was performed for global imaging of the pelvis including uterus, ovaries, adnexal regions, and pelvic cul-de-sac. It was necessary to proceed with endovaginal exam following the transabdominal exam to visualize the uterus, endometrium, and ovaries. Color and duplex Doppler ultrasound was utilized to evaluate blood flow to the ovaries. COMPARISON:  Prior CT from 02/27/2018. FINDINGS: Uterus Measurements: 8.1 x 4.9 x 5.4 cm. Intramural fundal fibroid measuring 1.3 x 1.5 x 1.2 cm noted. Endometrium Thickness: 7.5 mm.  No focal abnormality visualized. Right ovary Measurements: 3.2 x 2.5 x 2.1 cm. Normal appearance/no adnexal mass. Left ovary Measurements: 3.6 x 2.6 x 2.4 cm. 1.1 x 1.0 x 1.2 cm predominantly simple cyst, favored to reflect a small physiologic follicular dominant follicle. Additional smaller adjacent cyst also felt to be most consistent with a small normal physiologic follicle. No other definite discrete collection or abscess identified. Pulsed Doppler evaluation of both ovaries demonstrates normal low-resistance arterial and venous waveforms. Other findings No abnormal free fluid. IMPRESSION: 1. Small left ovarian cysts measuring up to  1.2 cm, felt to be most consistent with normal physiologic follicular cysts. 2. No other discrete collection or abscess associated with previously identified sigmoid diverticulitis identified. 3. No evidence for ovarian torsion. 4. 1.5 cm fundal fibroid. Electronically Signed   By: Jeannine Boga M.D.   On: 02/28/2018 02:13   Ct Abdomen Pelvis W Contrast  Result Date: 02/27/2018 CLINICAL DATA:  37 year old female with nausea vomiting and abdominal  pain. EXAM: CT ABDOMEN AND PELVIS WITH CONTRAST TECHNIQUE: Multidetector CT imaging of the abdomen and pelvis was performed using the standard protocol following bolus administration of intravenous contrast. CONTRAST:  142m ISOVUE-300 IOPAMIDOL (ISOVUE-300) INJECTION 61% COMPARISON:  CT of the abdomen pelvis dated 09/28/2016 FINDINGS: Lower chest: The visualized lung bases are clear. No intra-abdominal free air.  Small free fluid within the pelvis. Hepatobiliary: The liver is unremarkable. No intrahepatic biliary ductal dilatation. Cholecystectomy. The common bile duct measures 13 mm. No calcified stone noted in the central CBD. Pancreas: Unremarkable. No pancreatic ductal dilatation or surrounding inflammatory changes. Spleen: There is a 4.5 cm inferior splenic cyst. Adrenals/Urinary Tract: The adrenal glands are unremarkable. There is no hydronephrosis on either side. The visualized ureters and urinary bladder appear unremarkable. Stomach/Bowel: There is sigmoid diverticulosis without active inflammatory changes compatible with acute diverticulitis. There is loss of fat plane between the sigmoid colon and the left ovary which may represent a degree of adhesion. There is a somewhat tubular appearing low attenuating structure adjacent to the sigmoid colon and in the region of the left ovary. Although this may represent ovarian follicle or normal ovarian tissue, an abscess is not excluded. This measures approximately 3.2 x 2.7 cm. Vascular/Lymphatic: No significant vascular findings are present. No enlarged abdominal or pelvic lymph nodes. Reproductive: The uterus is anteverted and grossly unremarkable. There is mildly enlargement of the left ovary with inflammatory changes secondary to inflammation of this sigmoid diverticula. Low attenuating structure in the region of the left ovary, may represent follicle or developing abscess. Other: Midline vertical anterior pelvic wall incisional scar. Musculoskeletal: No  acute or significant osseous findings. IMPRESSION: Acute sigmoid diverticulitis. Fluid attenuating structure adjacent to the sigmoid colon in the region of the left ovary may represent ovarian follicle versus developing abscess. Clinical correlation is recommended. Electronically Signed   By: AAnner CreteM.D.   On: 02/27/2018 23:01   UK[place]Art/ven Flow Abd Pelv Doppler  Result Date: 02/28/2018 CLINICAL DATA:  Initial evaluation for acute left lower quadrant abdominal pain. Abnormal CT. EXAM: TRANSABDOMINAL AND TRANSVAGINAL ULTRASOUND OF PELVIS DOPPLER ULTRASOUND OF OVARIES TECHNIQUE: Both transabdominal and transvaginal ultrasound examinations of the pelvis were performed. Transabdominal technique was performed for global imaging of the pelvis including uterus, ovaries, adnexal regions, and pelvic cul-de-sac. It was necessary to proceed with endovaginal exam following the transabdominal exam to visualize the uterus, endometrium, and ovaries. Color and duplex Doppler ultrasound was utilized to evaluate blood flow to the ovaries. COMPARISON:  Prior CT from 02/27/2018. FINDINGS: Uterus Measurements: 8.1 x 4.9 x 5.4 cm. Intramural fundal fibroid measuring 1.3 x 1.5 x 1.2 cm noted. Endometrium Thickness: 7.5 mm.  No focal abnormality visualized. Right ovary Measurements: 3.2 x 2.5 x 2.1 cm. Normal appearance/no adnexal mass. Left ovary Measurements: 3.6 x 2.6 x 2.4 cm. 1.1 x 1.0 x 1.2 cm predominantly simple cyst, favored to reflect a small physiologic follicular dominant follicle. Additional smaller adjacent cyst also felt to be most consistent with a small normal physiologic follicle. No other definite discrete collection or abscess identified. Pulsed  Doppler evaluation of both ovaries demonstrates normal low-resistance arterial and venous waveforms. Other findings No abnormal free fluid. IMPRESSION: 1. Small left ovarian cysts measuring up to 1.2 cm, felt to be most consistent with normal physiologic follicular  cysts. 2. No other discrete collection or abscess associated with previously identified sigmoid diverticulitis identified. 3. No evidence for ovarian torsion. 4. 1.5 cm fundal fibroid. Electronically Signed   By: Jeannine Boga M.D.   On: 02/28/2018 02:13    Pending Labs Unresulted Labs (From admission, onward)    Start     Ordered   02/27/18 2336  Urine culture  STAT,   STAT    Question:  Patient immune status  Answer:  Normal   02/27/18 2335   Signed and Held  HIV antibody (Routine Testing)  Once,   R     Signed and Held   Signed and Held  CBC  (heparin)  Once,   R    Comments:  Baseline for heparin therapy IF NOT ALREADY DRAWN.  Notify MD if PLT < 100 K.    Signed and Held   Signed and Held  Creatinine, serum  (heparin)  Once,   R    Comments:  Baseline for heparin therapy IF NOT ALREADY DRAWN.    Signed and Held   Signed and Held  Comprehensive metabolic panel  Tomorrow morning,   R     Signed and Held   Signed and Held  CBC  Tomorrow morning,   R     Signed and Held          Vitals/Pain Today's Vitals   02/27/18 2204 02/28/18 0004 02/28/18 0114 02/28/18 0224  BP:  109/67  101/64  Pulse:  86  88  Resp:  15  17  Temp:  98.7 F (37.1 C)  98.9 F (37.2 C)  TempSrc:  Oral  Oral  SpO2:  99%  95%  Weight:      Height:      PainSc: _0 Isolation Precautions No active isolations  Medications Medications  iopamidol (ISOVUE-300) 61 % injection (has no administration in time range)  sodium chloride 0.9 % bolus 1,000 mL (1,000 mLs Intravenous New Bag/Given 02/28/18 0220)  meropenem (MERREM) 1 g in sodium chloride 0.9 % 100 mL IVPB (1 g Intravenous New Bag/Given 02/28/18 0222)  0.9 %  sodium chloride infusion ( Intravenous Stopped 02/28/18 0139)  sodium chloride 0.9 % bolus 1,000 mL (0 mLs Intravenous Stopped 02/27/18 2355)  HYDROmorphone (DILAUDID) injection 1 mg (1 mg Intravenous Given 02/27/18 2136)  ondansetron (ZOFRAN) injection 4 mg (4 mg Intravenous  Given 02/27/18 2136)  acetaminophen (TYLENOL) tablet 1,000 mg (1,000 mg Oral Given 02/27/18 2136)  iopamidol (ISOVUE-300) 61 % injection 100 mL (100 mLs Intravenous Contrast Given 02/27/18 2217)  oxyCODONE-acetaminophen (PERCOCET/ROXICET) 5-325 MG per tablet 2 tablet (2 tablets Oral Given 02/28/18 0004)  piperacillin-tazobactam (ZOSYN) IVPB 3.375 g (0 g Intravenous Stopped 02/28/18 0139)    Mobility walks

## 2018-02-28 NOTE — Progress Notes (Signed)
The patient was admitted early this morning after midnight and H&P has been reviewed and I am in current agreement with assessment and plan done by Dr. Jossie Ng.  Additional changes to the plan of care been made accordingly.  The patient is a 37 year old female with past mental history significant for depression, morbid obesity, history of C-section with cecal perforation 5 days after C-section status post partial colectomy by Dr. Marlou Starks, history of carcinoid tumor of the appendix, status post cholecystectomy, gallstone pancreatitis, history of diverticulitis and other comorbidities who presents with throbbing intermittent left lower quadrant pain that is been intermittent for over a week that progressively worse in the last 2 days sharp pain.  States this episode is less painful than when she had and back in April 2018 however she was further evaluated and work up revealed that she had acute sigmoid diverticulitis with concern for developing abscess.  She is admitted for a suspected pelvic abscess but ultrasound showed a small left ovarian cyst measuring up to 1.2 cm consistent with physiological follicular cysts and no discrete collection or abscess was associated and identified with a sigmoid diverticulitis.  General surgery was consulted and patient was made n.p.o. and placed on bowel rest and started on IV antibiotics as well with IV Zosyn.  Blood cultures also obtained.  Patient states that she is feeling better after coming in and she is currently getting IV fluid resuscitation with normal saline at 100 and mils per hour.  We will continue monitor patient clinical response to intervention and repeat blood work in the a.m.

## 2018-02-28 NOTE — Consult Note (Signed)
[facility] Consult/Admission Note  [name] [name] 1981/01/17  [id number].    Requesting MD: Dr. Alfredia Ferguson Chief Complaint/Reason for Consult: diverticulitis  HPI:  Pt is a 37 year yo female with a history of depression, morbid obesity, C section 11/2012, cecal perforation 5 days after C section s/p partial colectomy by Dr. 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 Marlou Starks 07/2017 who presented to the ED with throbbing, intermittent, LLQ pain on and off for the last week which progressively worsened in the last 2 days into sharp pain. She states this episode is less painful then the one in 04/218. She states associated intermittent nausea, headache and one episode of vomiting. No real changes in bowel habits. She states loose stools at baseline. No fever or chills.   CT showed: Acute sigmoid diverticulitis. Fluid attenuating structure adjacent to the sigmoid colon in the region of the left ovary may represent ovarian follicle versus developing abscess.  US showed: Small left ovarian cysts measuring up to 1.2 cm, felt to be most consistent with normal physiologic follicular cysts. No other discrete collection or abscess associated with previously identified sigmoid diverticulitis identified  ROS:  Review of Systems  Constitutional: Negative for chills, diaphoresis and fever.  HENT: Negative for sore throat.   Respiratory: Negative for cough and shortness of breath.   Cardiovascular: Negative for chest pain.  Gastrointestinal: Positive for abdominal pain and diarrhea. Negative for blood in stool, constipation, nausea and vomiting.  Genitourinary: Negative for dysuria.  Skin: Negative for rash.  Neurological: Negative for dizziness and loss of consciousness.  All other systems reviewed and are negative.    Family History  Problem Relation Age of  Onset  . Cancer Father        Leukemia  . ADD / ADHD Unknown        family hx  . Breast cancer Unknown        fhx  . Mental illness Unknown        fhx  . Heart disease Unknown        fhx  . Hypertension Unknown        fhx    Past Medical History:  Diagnosis Date  . Anxiety   . Asthma    childhood exercise induced only  . Cancer ([place])    appendix  . Cholecystitis chronic, acute   . Depression   . Gallstones   . Headache(784.0)    migraines  . Heart murmur    no indications for 7-62yr per pt.    Past Surgical History:  Procedure Laterality Date  . APPENDECTOMY    . CESAREAN SECTION N/A 11/03/2012   Procedure: PRIMARY CESAREAN SECTION;  Surgeon: TMelina Schools MD;  [facility];  Service: Obstetrics;  Laterality: N/A;  . CHOLECYSTECTOMY N/A 12/23/2012   Procedure: LAPAROSCOPIC CHOLECYSTECTOMY WITH INTRAOPERATIVE CHOLANGIOGRAM;  Surgeon: MPedro Earls MD;  Location: [facility];  Service: General;  Laterality: N/A;  Laparoscopic Cholecystectomy with IOC  . COLON SURGERY    . ERCP N/A 12/22/2012   Procedure: ENDOSCOPIC RETROGRADE CHOLANGIOPANCREATOGRAPHY (ERCP);  Surgeon: CGatha Mayer MD;  [facility];  Service: Endoscopy;  Laterality: N/A;  . INSERTION OF MESH N/A 07/15/2017   Procedure: INSERTION OF MESH;  Surgeon: TJovita Kussmaul MD;  [facility]  Service: General;  Laterality: N/A;  . LAPAROTOMY N/A 11/08/2012   Procedure: laparoscopic exploration  and EXPLORATORY LAPAROTOMY;  Surgeon: Merrie Roof, MD;  Location: [facility];  Service: General;  Laterality: N/A;  . VENTRAL HERNIA REPAIR N/A 07/15/2017   Procedure: LAPAROSCOPIC ASSISTED VENTRAL HERNIA REPAIR;  Surgeon: Jovita Kussmaul, MD;  Location: [facility];  Service: General;  Laterality: N/A;    Social History:  reports that she has never smoked. She has never used smokeless tobacco. She reports that she does not drink alcohol or use drugs.  Allergies: No Known Allergies  Medications Prior to Admission   Medication Sig Dispense Refill  . amitriptyline (ELAVIL) 25 MG tablet Take 0.5 tablets (12.5 mg total) by mouth at bedtime. 45 tablet 1  . ibuprofen (ADVIL,MOTRIN) 200 MG tablet Take 400 mg by mouth every 6 (six) hours as needed for headache or moderate pain.    . norgestimate-ethinyl estradiol (ORTHO-CYCLEN, 28,) 0.25-35 MG-MCG tablet Take 1 tablet by mouth daily. 3 Package 3  . venlafaxine XR (EFFEXOR XR) 75 MG 24 hr capsule Take 1 capsule (75 mg total) by mouth daily with breakfast. 30 capsule 2  . HYDROcodone-acetaminophen (NORCO/VICODIN) 5-325 MG tablet Take 1-2 tablets by mouth every 6 (six) hours as needed for moderate pain. (Patient not taking: Reported on 02/27/2018) 20 tablet 0    Blood pressure (!) 104/59, pulse 90, temperature 98.4 F (36.9 C), resp. rate 18, height '5\' 4"'$  (1.626 m), weight 129.2 kg, last menstrual period 02/09/2018, SpO2 96 %.  Physical Exam  Constitutional: She is oriented to person, place, and time. She appears well-developed and well-nourished.  Non-toxic appearance. She does not appear ill. No distress.  HENT:  Head: Normocephalic and atraumatic.  Nose: Nose normal.  Mouth/Throat: Uvula is midline, oropharynx is clear and moist and mucous membranes are normal. No oropharyngeal exudate.  Eyes: Pupils are equal, round, and reactive to light. Conjunctivae are normal. Right eye exhibits no discharge. Left eye exhibits no discharge. No scleral icterus.  Neck: Normal range of motion. Neck supple. No thyromegaly present.  Cardiovascular: Normal rate, regular rhythm, normal heart sounds and intact distal pulses.  No murmur heard. Pulses:      Radial pulses are 2+ on the right side, and 2+ on the left side.       Posterior tibial pulses are 2+ on the right side, and 2+ on the left side.  Pulmonary/Chest: Effort normal and breath sounds normal. No respiratory distress. She has no wheezes. She has no rhonchi. She has no rales.  Abdominal: Soft. Bowel sounds are normal.  She exhibits no distension. There is no hepatosplenomegaly. There is tenderness in the left lower quadrant. There is no rigidity and no guarding.  Obese, Well healed midline scar inferior to umbilicus   Musculoskeletal: Normal range of motion. She exhibits no edema, tenderness or deformity.  Lymphadenopathy:    She has no cervical adenopathy.  Neurological: She is alert and oriented to person, place, and time.  Skin: Skin is warm and dry. No rash noted. She is not diaphoretic.  Psychiatric: She has a normal mood and affect.  Nursing note and vitals reviewed.   Results for orders placed or performed during the hospital encounter of 02/27/18 (from the past 48 hour(s))  CBG monitoring, ED     Status: None   Collection Time: 02/27/18  6:37 PM  Result Value Ref Range   Glucose-Capillary 85 70 - 99 mg/dL  Basic metabolic panel     Status: Abnormal   Collection Time: 02/27/18  7:18 PM  Result Value Ref Range  Sodium 143 135 - 145 mmol/L   Potassium 3.4 (L) 3.5 - 5.1 mmol/L   Chloride 108 98 - 111 mmol/L   CO2 26 22 - 32 mmol/L   Glucose, Bld 95 70 - 99 mg/dL   BUN 8 6 - 20 mg/dL   Creatinine, Ser 0.69 0.44 - 1.00 mg/dL   Calcium 8.8 (L) 8.9 - 10.3 mg/dL   GFR calc non Af Amer >60 >60 mL/min   GFR calc Af Amer >60 >60 mL/min    Comment: (NOTE) The eGFR has been calculated using the CKD EPI equation. This calculation has not been validated in all clinical situations. eGFR's persistently <60 mL/min signify possible Chronic Kidney Disease.    Anion gap 9 5 - 15    Comment: Performed at [facility], [place] [address] [place], [place] [address]  CBC     Status: None   Collection Time: 02/27/18  7:18 PM  Result Value Ref Range   WBC 9.8 4.0 - 10.5 K/uL   RBC 4.11 3.87 - 5.11 MIL/uL   Hemoglobin 12.5 12.0 - 15.0 g/dL   HCT 37.1 36.0 - 46.0 %   MCV 90.3 78.0 - 100.0 fL   MCH 30.4 26.0 - 34.0 pg   MCHC 33.7 30.0 - 36.0 g/dL   RDW 13.7 11.5 - 15.5 %   Platelets 286  150 - 400 K/uL    Comment: Performed at [facility], [place] [address] [place], [place] [address]  Hepatic function panel     Status: Abnormal   Collection Time: 02/27/18  7:25 PM  Result Value Ref Range   Total Protein 7.2 6.5 - 8.1 g/dL   Albumin 3.1 (L) 3.5 - 5.0 g/dL   AST 14 (L) 15 - 41 U/L   ALT 16 0 - 44 U/L   Alkaline Phosphatase 37 (L) 38 - 126 U/L   Total Bilirubin 0.6 0.3 - 1.2 mg/dL   Bilirubin, Direct 0.1 0.0 - 0.2 mg/dL   Indirect Bilirubin 0.5 0.3 - 0.9 mg/dL    Comment: Performed at [facility], [place] [address] [place], [place] [address]  Lipase, blood     Status: None   Collection Time: 02/27/18  7:25 PM  Result Value Ref Range   Lipase 30 11 - 51 U/L    Comment: Performed at [facility], [place] [address] [place], [place] [address]  I-Stat beta hCG blood, ED     Status: None   Collection Time: 02/27/18  7:26 PM  Result Value Ref Range   I-stat hCG, quantitative <5.0 <5 mIU/mL   Comment 3            Comment:   GEST. AGE      CONC.  (mIU/mL)   <=1 WEEK        5 - 50     2 WEEKS       50 - 500     3 WEEKS       100 - 10,000     4 WEEKS     1,000 - 30,000        FEMALE AND NON-PREGNANT FEMALE:     LESS THAN 5 mIU/mL   I-Stat CG4 Lactic Acid, ED     Status: None   Collection Time: 02/27/18  9:25 PM  Result Value Ref Range   Lactic Acid, Venous 1.22 0.5 - 1.9 mmol/L  Urinalysis, Routine w reflex microscopic     Status: Abnormal  Collection Time: 02/27/18  9:41 PM  Result Value Ref Range   Color, Urine AMBER (A) YELLOW    Comment: BIOCHEMICALS MAY BE AFFECTED BY COLOR   APPearance HAZY (A) CLEAR   Specific Gravity, Urine 1.020 1.005 - 1.030   pH 5.0 5.0 - 8.0   Glucose, UA NEGATIVE NEGATIVE mg/dL   Hgb urine dipstick LARGE (A) NEGATIVE   Bilirubin Urine NEGATIVE NEGATIVE   Ketones, ur 5 (A) NEGATIVE mg/dL   Protein, ur NEGATIVE NEGATIVE mg/dL   Nitrite POSITIVE (A) NEGATIVE   Leukocytes, UA SMALL (A)  NEGATIVE   RBC / HPF 0-5 0 - 5 RBC/hpf   WBC, UA 21-50 0 - 5 WBC/hpf   Bacteria, UA MANY (A) NONE SEEN   Squamous Epithelial / LPF 0-5 0 - 5   Mucus PRESENT    Crystals PRESENT (A) NEGATIVE    Comment: Performed at [facility], [place] [address] [place], [place] [address]  Wet prep, genital     Status: Abnormal   Collection Time: 02/27/18 11:35 PM  Result Value Ref Range   Yeast Wet Prep HPF POC NONE SEEN NONE SEEN   Trich, Wet Prep NONE SEEN NONE SEEN   Clue Cells Wet Prep HPF POC PRESENT (A) NONE SEEN   WBC, Wet Prep HPF POC FEW (A) NONE SEEN   Sperm NONE SEEN     Comment: Performed at [facility], [place] [address] [place], [place] [address]  Comprehensive metabolic panel     Status: Abnormal   Collection Time: 02/28/18  6:07 AM  Result Value Ref Range   Sodium 140 135 - 145 mmol/L   Potassium 3.1 (L) 3.5 - 5.1 mmol/L   Chloride 109 98 - 111 mmol/L   CO2 24 22 - 32 mmol/L   Glucose, Bld 93 70 - 99 mg/dL   BUN 7 6 - 20 mg/dL   Creatinine, Ser 0.61 0.44 - 1.00 mg/dL   Calcium 7.7 (L) 8.9 - 10.3 mg/dL   Total Protein 6.3 (L) 6.5 - 8.1 g/dL   Albumin 2.7 (L) 3.5 - 5.0 g/dL   AST 12 (L) 15 - 41 U/L   ALT 14 0 - 44 U/L   Alkaline Phosphatase 34 (L) 38 - 126 U/L   Total Bilirubin 0.8 0.3 - 1.2 mg/dL   GFR calc non Af Amer >60 >60 mL/min   GFR calc Af Amer >60 >60 mL/min    Comment: (NOTE) The eGFR has been calculated using the CKD EPI equation. This calculation has not been validated in all clinical situations. eGFR's persistently <60 mL/min signify possible Chronic Kidney Disease.    Anion gap 7 5 - 15    Comment: Performed at [facility], [place] [address] [place], [place] [address]  CBC     Status: Abnormal   Collection Time: 02/28/18  6:07 AM  Result Value Ref Range   WBC 6.6 4.0 - 10.5 K/uL   RBC 3.72 (L) 3.87 - 5.11 MIL/uL   Hemoglobin 11.2 (L) 12.0 - 15.0 g/dL   HCT 33.7 (L) 36.0 - 46.0 %   MCV 90.6 78.0 -  100.0 fL   MCH 30.1 26.0 - 34.0 pg   MCHC 33.2 30.0 - 36.0 g/dL   RDW 13.9 11.5 - 15.5 %   Platelets 223 150 - 400 K/uL    Comment: Performed at [facility], [place] [address] [place], [place] [address]   US Transvaginal Non-ob  Result Date:  02/28/2018 CLINICAL DATA:  Initial evaluation for acute left lower quadrant abdominal pain. Abnormal CT. EXAM: TRANSABDOMINAL AND TRANSVAGINAL ULTRASOUND OF PELVIS DOPPLER ULTRASOUND OF OVARIES TECHNIQUE: Both transabdominal and transvaginal ultrasound examinations of the pelvis were performed. Transabdominal technique was performed for global imaging of the pelvis including uterus, ovaries, adnexal regions, and pelvic cul-de-sac. It was necessary to proceed with endovaginal exam following the transabdominal exam to visualize the uterus, endometrium, and ovaries. Color and duplex Doppler ultrasound was utilized to evaluate blood flow to the ovaries. COMPARISON:  Prior CT from 02/27/2018. FINDINGS: Uterus Measurements: 8.1 x 4.9 x 5.4 cm. Intramural fundal fibroid measuring 1.3 x 1.5 x 1.2 cm noted. Endometrium Thickness: 7.5 mm.  No focal abnormality visualized. Right ovary Measurements: 3.2 x 2.5 x 2.1 cm. Normal appearance/no adnexal mass. Left ovary Measurements: 3.6 x 2.6 x 2.4 cm. 1.1 x 1.0 x 1.2 cm predominantly simple cyst, favored to reflect a small physiologic follicular dominant follicle. Additional smaller adjacent cyst also felt to be most consistent with a small normal physiologic follicle. No other definite discrete collection or abscess identified. Pulsed Doppler evaluation of both ovaries demonstrates normal low-resistance arterial and venous waveforms. Other findings No abnormal free fluid. IMPRESSION: 1. Small left ovarian cysts measuring up to 1.2 cm, felt to be most consistent with normal physiologic follicular cysts. 2. No other discrete collection or abscess associated with previously identified sigmoid diverticulitis identified.  3. No evidence for ovarian torsion. 4. 1.5 cm fundal fibroid. Electronically Signed   By: Jeannine Boga M.D.   On: 02/28/2018 02:13   US Pelvis Complete  Result Date: 02/28/2018 CLINICAL DATA:  Initial evaluation for acute left lower quadrant abdominal pain. Abnormal CT. EXAM: TRANSABDOMINAL AND TRANSVAGINAL ULTRASOUND OF PELVIS DOPPLER ULTRASOUND OF OVARIES TECHNIQUE: Both transabdominal and transvaginal ultrasound examinations of the pelvis were performed. Transabdominal technique was performed for global imaging of the pelvis including uterus, ovaries, adnexal regions, and pelvic cul-de-sac. It was necessary to proceed with endovaginal exam following the transabdominal exam to visualize the uterus, endometrium, and ovaries. Color and duplex Doppler ultrasound was utilized to evaluate blood flow to the ovaries. COMPARISON:  Prior CT from 02/27/2018. FINDINGS: Uterus Measurements: 8.1 x 4.9 x 5.4 cm. Intramural fundal fibroid measuring 1.3 x 1.5 x 1.2 cm noted. Endometrium Thickness: 7.5 mm.  No focal abnormality visualized. Right ovary Measurements: 3.2 x 2.5 x 2.1 cm. Normal appearance/no adnexal mass. Left ovary Measurements: 3.6 x 2.6 x 2.4 cm. 1.1 x 1.0 x 1.2 cm predominantly simple cyst, favored to reflect a small physiologic follicular dominant follicle. Additional smaller adjacent cyst also felt to be most consistent with a small normal physiologic follicle. No other definite discrete collection or abscess identified. Pulsed Doppler evaluation of both ovaries demonstrates normal low-resistance arterial and venous waveforms. Other findings No abnormal free fluid. IMPRESSION: 1. Small left ovarian cysts measuring up to 1.2 cm, felt to be most consistent with normal physiologic follicular cysts. 2. No other discrete collection or abscess associated with previously identified sigmoid diverticulitis identified. 3. No evidence for ovarian torsion. 4. 1.5 cm fundal fibroid. Electronically Signed   By:  Jeannine Boga M.D.   On: 02/28/2018 02:13   Ct Abdomen Pelvis W Contrast  Result Date: 02/27/2018 CLINICAL DATA:  37 year old female with nausea vomiting and abdominal pain. EXAM: CT ABDOMEN AND PELVIS WITH CONTRAST TECHNIQUE: Multidetector CT imaging of the abdomen and pelvis was performed using the standard protocol following bolus administration of intravenous contrast. CONTRAST:  146m ISOVUE-300 IOPAMIDOL (ISOVUE-300)  INJECTION 61% COMPARISON:  CT of the abdomen pelvis dated 09/28/2016 FINDINGS: Lower chest: The visualized lung bases are clear. No intra-abdominal free air.  Small free fluid within the pelvis. Hepatobiliary: The liver is unremarkable. No intrahepatic biliary ductal dilatation. Cholecystectomy. The common bile duct measures 13 mm. No calcified stone noted in the central CBD. Pancreas: Unremarkable. No pancreatic ductal dilatation or surrounding inflammatory changes. Spleen: There is a 4.5 cm inferior splenic cyst. Adrenals/Urinary Tract: The adrenal glands are unremarkable. There is no hydronephrosis on either side. The visualized ureters and urinary bladder appear unremarkable. Stomach/Bowel: There is sigmoid diverticulosis without active inflammatory changes compatible with acute diverticulitis. There is loss of fat plane between the sigmoid colon and the left ovary which may represent a degree of adhesion. There is a somewhat tubular appearing low attenuating structure adjacent to the sigmoid colon and in the region of the left ovary. Although this may represent ovarian follicle or normal ovarian tissue, an abscess is not excluded. This measures approximately 3.2 x 2.7 cm. Vascular/Lymphatic: No significant vascular findings are present. No enlarged abdominal or pelvic lymph nodes. Reproductive: The uterus is anteverted and grossly unremarkable. There is mildly enlargement of the left ovary with inflammatory changes secondary to inflammation of this sigmoid diverticula. Low  attenuating structure in the region of the left ovary, may represent follicle or developing abscess. Other: Midline vertical anterior pelvic wall incisional scar. Musculoskeletal: No acute or significant osseous findings. IMPRESSION: Acute sigmoid diverticulitis. Fluid attenuating structure adjacent to the sigmoid colon in the region of the left ovary may represent ovarian follicle versus developing abscess. Clinical correlation is recommended. Electronically Signed   By: Anner Crete M.D.   On: 02/27/2018 23:01   [place] Art/ven Flow Abd Pelv Doppler  Result Date: 02/28/2018 CLINICAL DATA:  Initial evaluation for acute left lower quadrant abdominal pain. Abnormal CT. EXAM: TRANSABDOMINAL AND TRANSVAGINAL ULTRASOUND OF PELVIS DOPPLER ULTRASOUND OF OVARIES TECHNIQUE: Both transabdominal and transvaginal ultrasound examinations of the pelvis were performed. Transabdominal technique was performed for global imaging of the pelvis including uterus, ovaries, adnexal regions, and pelvic cul-de-sac. It was necessary to proceed with endovaginal exam following the transabdominal exam to visualize the uterus, endometrium, and ovaries. Color and duplex Doppler ultrasound was utilized to evaluate blood flow to the ovaries. COMPARISON:  Prior CT from 02/27/2018. FINDINGS: Uterus Measurements: 8.1 x 4.9 x 5.4 cm. Intramural fundal fibroid measuring 1.3 x 1.5 x 1.2 cm noted. Endometrium Thickness: 7.5 mm.  No focal abnormality visualized. Right ovary Measurements: 3.2 x 2.5 x 2.1 cm. Normal appearance/no adnexal mass. Left ovary Measurements: 3.6 x 2.6 x 2.4 cm. 1.1 x 1.0 x 1.2 cm predominantly simple cyst, favored to reflect a small physiologic follicular dominant follicle. Additional smaller adjacent cyst also felt to be most consistent with a small normal physiologic follicle. No other definite discrete collection or abscess identified. Pulsed Doppler evaluation of both ovaries demonstrates normal low-resistance arterial and  venous waveforms. Other findings No abnormal free fluid. IMPRESSION: 1. Small left ovarian cysts measuring up to 1.2 cm, felt to be most consistent with normal physiologic follicular cysts. 2. No other discrete collection or abscess associated with previously identified sigmoid diverticulitis identified. 3. No evidence for ovarian torsion. 4. 1.5 cm fundal fibroid. Electronically Signed   By: Jeannine Boga M.D.   On: 02/28/2018 02:13      Assessment/Plan Principal Problem:   Pelvic abscess in female Active Problems:   Carcinoid tumor of appendix, malignant ([facility])   Moderate major depression (  [place])   Hypokalemia   UTI (urinary tract infection)  Diverticulitis - 2 bout - continue bowel rest, IV abx, IVF and hopefully this will resolve without need for emergent surgery - pt is well know by Dr. Marlou Starks so recommend f/u in 6-8 weeks to discuss elective partial colectomy of sigmoid after colonoscopy  FEN: clears VTE: SCD's, heparin ID: Zosyn 09/27 Merrem 09/27>> Foley:none Follow up: TBD  Thank you for the consult.    Kalman Drape, [facility] 02/28/2018, 7:51 AM Pager: [phone] Consults: [phone] Mon-Fri 7:00 am-4:30 pm Sat-Sun 7:00 am-11:30 am

## 2018-02-28 NOTE — Progress Notes (Signed)
Patient received 2 runs of K+CL IV. Patient refusing last 2 runs d/t she said she cannot tolerate it even though it was infusing at 40 mL/hr.  MD notified via text page.

## 2018-02-28 NOTE — H&P (Signed)
History and Physical   [name] [name] TFT:[id number] DOB: 18-Sep-1980 DOA: 02/27/2018  Referring MD/NP/PA: Duffy Bruce, PA  PCP: Martinique, Betty G, MD   Outpatient Specialists: None  Patient coming from: Home  Chief Complaint: Abdominal pain  HPI: [name] [name] is a 37 y.o. female with medical history significant of carcinoid tumor of the appendix, diverticular disease, history of colon perforation in the past, morbid obesity, and major depressive disorder who presented with abdominal pain on and off of for the last week but got worse in the last 2 days.  Pain is in the left lower quadrant.  Was throbbing in nature and then turned into sharp.  Associated with subjective fever and chills.  Patient has had no nausea or vomiting but diarrhea up to 7 stools a day.  No blood.  Pain is at 6 out of 10 at the most.  She had prior history of carcinoid tumor of the appendix as mentioned above.  She was worried about recurrence.  In the ER patient has been found to have acute appendicitis with suspected abscess.  She is being admitted for further work-up.  She denied any urinary symptoms but the urinalysis suggested UTI..  ED Course: Temperature is 99.6 with blood pressure 103/62 pulse 110 respiratory of 18 oxygen sat 99% room air.  White count is 9.8 potassium 3.4 and calcium 8.8.  Pelvic exam showed no adnexal tenderness.  She had wet prep showing few WBCs and clue cells.  CT abdomen pelvis showed Acute sigmoid diverticulitis. Fluid attenuating structure adjacent to the sigmoid colon in the region of the left ovary may represent ovarian follicle versus developing abscess. Clinical correlation is Recommended.  Patient treated with IV antibiotics and pain and being admitted for treatment.  Her urinalysis showed positive nitrite with WBC 21-50 and many bacteria.  Review of Systems: As per HPI otherwise 10 point review of systems negative.    Past Medical History:  Diagnosis Date  . Anxiety   .  Asthma    childhood exercise induced only  . Cancer ([place])    appendix  . Cholecystitis chronic, acute   . Depression   . Gallstones   . Headache(784.0)    migraines  . Heart murmur    no indications for 7-57yrs per pt.    Past Surgical History:  Procedure Laterality Date  . APPENDECTOMY    . CESAREAN SECTION N/A 11/03/2012   Procedure: PRIMARY CESAREAN SECTION;  Surgeon: Melina Schools, MD;  Location: [facility];  Service: Obstetrics;  Laterality: N/A;  . CHOLECYSTECTOMY N/A 12/23/2012   Procedure: LAPAROSCOPIC CHOLECYSTECTOMY WITH INTRAOPERATIVE CHOLANGIOGRAM;  Surgeon: Pedro Earls, MD;  Location: [facility];  Service: General;  Laterality: N/A;  Laparoscopic Cholecystectomy with IOC  . COLON SURGERY    . ERCP N/A 12/22/2012   Procedure: ENDOSCOPIC RETROGRADE CHOLANGIOPANCREATOGRAPHY (ERCP);  Surgeon: Gatha Mayer, MD;  Location: [facility];  Service: Endoscopy;  Laterality: N/A;  . INSERTION OF MESH N/A 07/15/2017   Procedure: INSERTION OF MESH;  Surgeon: Jovita Kussmaul, MD;  Location: [facility];  Service: General;  Laterality: N/A;  . LAPAROTOMY N/A 11/08/2012   Procedure: laparoscopic exploration and EXPLORATORY LAPAROTOMY;  Surgeon: Merrie Roof, MD;  Location: [facility];  Service: General;  Laterality: N/A;  . VENTRAL HERNIA REPAIR N/A 07/15/2017   Procedure: LAPAROSCOPIC ASSISTED VENTRAL HERNIA REPAIR;  Surgeon: Jovita Kussmaul, MD;  Location: [facility];  Service: General;  Laterality: N/A;     reports that she has  never smoked. She has never used smokeless tobacco. She reports that she does not drink alcohol or use drugs.  No Known Allergies  Family History  Problem Relation Age of Onset  . Cancer Father        Leukemia  . ADD / ADHD Unknown        family hx  . Breast cancer Unknown        fhx  . Mental illness Unknown        fhx  . Heart disease Unknown        fhx  . Hypertension Unknown        fhx     Prior to Admission medications   Medication Sig Start Date End Date  Taking? Authorizing Provider  amitriptyline (ELAVIL) 25 MG tablet Take 0.5 tablets (12.5 mg total) by mouth at bedtime. 09/11/17  Yes Martinique, Betty G, MD  ibuprofen (ADVIL,MOTRIN) 200 MG tablet Take 400 mg by mouth every 6 (six) hours as needed for headache or moderate pain.   Yes [provider]  norgestimate-ethinyl estradiol (ORTHO-CYCLEN, 28,) 0.25-35 MG-MCG tablet Take 1 tablet by mouth daily. 06/18/17  Yes [place]ique, Betty G, MD  venlafaxine XR (EFFEXOR XR) 75 MG 24 hr capsule Take 1 capsule (75 mg total) by mouth daily with breakfast. 11/25/17  Yes [place], Betty G, MD  amoxicillin-clavulanate (AUGMENTIN) 875-125 MG tablet Take 1 tablet by mouth every 12 (twelve) hours for 10 days. 02/27/18 03/09/18  Duffy Bruce, MD  HYDROcodone-acetaminophen (NORCO/VICODIN) 5-325 MG tablet Take 1-2 tablets by mouth every 6 (six) hours as needed for moderate pain. Patient not taking: Reported on 02/27/2018 07/18/17   Autumn Messing III, MD  ondansetron (ZOFRAN ODT) 4 MG disintegrating tablet Take 1 tablet (4 mg total) by mouth every 8 (eight) hours as needed for nausea or vomiting. 02/27/18   Duffy Bruce, MD  oxyCODONE-acetaminophen (PERCOCET/ROXICET) 5-325 MG tablet Take 1-2 tablets by mouth every 4 (four) hours as needed for moderate pain or severe pain. 02/27/18   Duffy Bruce, MD    Physical Exam: Vitals:   02/27/18 1832 02/27/18 2149 02/27/18 2153 02/28/18 0004  BP: 128/79 103/62 103/62 109/67  Pulse: (!) 110 95 95 86  Resp: 18 15 15 15   Temp: 99.6 F (37.6 C)  99.6 F (37.6 C) 98.7 F (37.1 C)  TempSrc: Oral  Oral Oral  SpO2: 99%  99% 99%  Weight: 129.2 kg     Height: 5\' 4"  (1.626 m)         Constitutional: NAD, calm, comfortable Vitals:   02/27/18 1832 02/27/18 2149 02/27/18 2153 02/28/18 0004  BP: 128/79 103/62 103/62 109/67  Pulse: (!) 110 95 95 86  Resp: 18 15 15 15   Temp: 99.6 F (37.6 C)  99.6 F (37.6 C) 98.7 F (37.1 C)  TempSrc: Oral  Oral Oral  SpO2: 99%  99% 99%    Weight: 129.2 kg     Height: 5\' 4"  (1.626 m)      Eyes: PERRL, lids and conjunctivae normal ENMT: Mucous membranes are moist. Posterior pharynx clear of any exudate or lesions.Normal dentition.  Neck: normal, supple, no masses, no thyromegaly Respiratory: clear to auscultation bilaterally, no wheezing, no crackles. Normal respiratory effort. No accessory muscle use.  Cardiovascular: Regular rate and rhythm, no murmurs / rubs / gallops. No extremity edema. 2+ pedal pulses. No carotid bruits.  Abdomen: Soft abdomen with left lower quadrant abdominal tenderness, no masses palpated. No hepatosplenomegaly. Bowel sounds positive.  Musculoskeletal: no clubbing /  cyanosis. No joint deformity upper and lower extremities. Good ROM, no contractures. Normal muscle tone.  Skin: no rashes, lesions, ulcers. No induration Neurologic: CN 2-12 grossly intact. Sensation intact, DTR normal. Strength 5/5 in all 4.  Psychiatric: Normal judgment and insight. Alert and oriented x 3. Normal mood.     Labs on Admission: I have personally reviewed following labs and imaging studies  CBC: Recent Labs  Lab 02/27/18 1918  WBC 9.8  HGB 12.5  HCT 37.1  MCV 90.3  PLT 253   Basic Metabolic Panel: Recent Labs  Lab 02/27/18 1918  NA 143  K 3.4*  CL 108  CO2 26  GLUCOSE 95  BUN 8  CREATININE 0.69  CALCIUM 8.8*   GFR: Estimated Creatinine Clearance: 128.4 mL/min (by C-G formula based on SCr of 0.69 mg/dL). Liver Function Tests: Recent Labs  Lab 02/27/18 1925  AST 14*  ALT 16  ALKPHOS 37*  BILITOT 0.6  PROT 7.2  ALBUMIN 3.1*   Recent Labs  Lab 02/27/18 1925  LIPASE 30   No results for input(s): AMMONIA in the last 168 hours. Coagulation Profile: No results for input(s): INR, PROTIME in the last 168 hours. Cardiac Enzymes: No results for input(s): CKTOTAL, CKMB, CKMBINDEX, TROPONINI in the last 168 hours. BNP (last 3 results) No results for input(s): PROBNP in the last 8760  hours. HbA1C: No results for input(s): HGBA1C in the last 72 hours. CBG: Recent Labs  Lab 02/27/18 1837  GLUCAP 85   Lipid Profile: No results for input(s): CHOL, HDL, LDLCALC, TRIG, CHOLHDL, LDLDIRECT in the last 72 hours. Thyroid Function Tests: No results for input(s): TSH, T4TOTAL, FREET4, T3FREE, THYROIDAB in the last 72 hours. Anemia Panel: No results for input(s): VITAMINB12, FOLATE, FERRITIN, TIBC, IRON, RETICCTPCT in the last 72 hours. Urine analysis:    Component Value Date/Time   COLORURINE AMBER (A) 02/27/2018 2141   APPEARANCEUR HAZY (A) 02/27/2018 2141   LABSPEC 1.020 02/27/2018 2141   PHURINE 5.0 02/27/2018 2141   GLUCOSEU NEGATIVE 02/27/2018 2141   HGBUR LARGE (A) 02/27/2018 2141   HGBUR trace-lysed 08/08/2007 1411   BILIRUBINUR NEGATIVE 02/27/2018 2141   KETONESUR 5 (A) 02/27/2018 2141   PROTEINUR NEGATIVE 02/27/2018 2141   UROBILINOGEN 1.0 12/20/2012 1905   NITRITE POSITIVE (A) 02/27/2018 2141   LEUKOCYTESUR SMALL (A) 02/27/2018 2141   Sepsis Labs: @LABRCNTIP (procalcitonin:4,lacticidven:4) ) Recent Results (from the past 240 hour(s))  Wet prep, genital     Status: Abnormal   Collection Time: 02/27/18 11:35 PM  Result Value Ref Range Status   Yeast Wet Prep HPF POC NONE SEEN NONE SEEN Final   Trich, Wet Prep NONE SEEN NONE SEEN Final   Clue Cells Wet Prep HPF POC PRESENT (A) NONE SEEN Final   WBC, Wet Prep HPF POC FEW (A) NONE SEEN Final   Sperm NONE SEEN  Final    Comment: Performed at [facility], [place] [address] [place], [place] [address]     Radiological Exams on Admission: Ct Abdomen Pelvis W Contrast  Result Date: 02/27/2018 CLINICAL DATA:  37 year old female with nausea vomiting and abdominal pain. EXAM: CT ABDOMEN AND PELVIS WITH CONTRAST TECHNIQUE: Multidetector CT imaging of the abdomen and pelvis was performed using the standard protocol following bolus administration of intravenous contrast. CONTRAST:  181mL ISOVUE-300  IOPAMIDOL (ISOVUE-300) INJECTION 61% COMPARISON:  CT of the abdomen pelvis dated 09/28/2016 FINDINGS: Lower chest: The visualized lung bases are clear. No intra-abdominal free air.  Small free fluid within the  pelvis. Hepatobiliary: The liver is unremarkable. No intrahepatic biliary ductal dilatation. Cholecystectomy. The common bile duct measures 13 mm. No calcified stone noted in the central CBD. Pancreas: Unremarkable. No pancreatic ductal dilatation or surrounding inflammatory changes. Spleen: There is a 4.5 cm inferior splenic cyst. Adrenals/Urinary Tract: The adrenal glands are unremarkable. There is no hydronephrosis on either side. The visualized ureters and urinary bladder appear unremarkable. Stomach/Bowel: There is sigmoid diverticulosis without active inflammatory changes compatible with acute diverticulitis. There is loss of fat plane between the sigmoid colon and the left ovary which may represent a degree of adhesion. There is a somewhat tubular appearing low attenuating structure adjacent to the sigmoid colon and in the region of the left ovary. Although this may represent ovarian follicle or normal ovarian tissue, an abscess is not excluded. This measures approximately 3.2 x 2.7 cm. Vascular/Lymphatic: No significant vascular findings are present. No enlarged abdominal or pelvic lymph nodes. Reproductive: The uterus is anteverted and grossly unremarkable. There is mildly enlargement of the left ovary with inflammatory changes secondary to inflammation of this sigmoid diverticula. Low attenuating structure in the region of the left ovary, may represent follicle or developing abscess. Other: Midline vertical anterior pelvic wall incisional scar. Musculoskeletal: No acute or significant osseous findings. IMPRESSION: Acute sigmoid diverticulitis. Fluid attenuating structure adjacent to the sigmoid colon in the region of the left ovary may represent ovarian follicle versus developing abscess. Clinical  correlation is recommended. Electronically Signed   By: Anner Crete M.D.   On: 02/27/2018 23:01     Assessment/Plan Principal Problem:   Pelvic abscess in female Active Problems:   Carcinoid tumor of appendix, malignant (HCC)   Moderate major depression (HCC)   Hypokalemia   UTI (urinary tract infection)     #1 pelvic abscess: Most likely related to acute appendicitis.  Cannot rule out adnexal abscess.  Will get ultrasound of the abdomen.  Admit the patient and start IV meropenem for gram-negative as well as anaerobic coverage.  If ultrasound confirms abscess we will get it drained.  Surgical consultation at the moment.  #2 acute appendicitis: Again we will continue with bowel rest and treatment as indicated above.  No perforation on CT.  #3 history of carcinoid tumor of the appendix: So far stable.  Patient will follow-up with her oncologist in the outpatient setting.  #4 hypokalemia: Replete potassium and monitor.  #5 UTI: Urine culture sensitivities to be followed.  Blood cultures also.  Continue empiric antibiotics.  #6 history of depression and anxiety: Continue home regiment.    DVT prophylaxis: Lovenox Code Status: Full code Family Communication: Sister in the room Disposition Plan: Home Consults called: General surgery being called by the ER Admission status: Inpatient  Severity of Illness: The appropriate patient status for this patient is INPATIENT. Inpatient status is judged to be reasonable and necessary in order to provide the required intensity of service to ensure the patient's safety. The patient's presenting symptoms, physical exam findings, and initial radiographic and laboratory data in the context of their chronic comorbidities is felt to place them at high risk for further clinical deterioration. Furthermore, it is not anticipated that the patient will be medically stable for discharge from the hospital within 2 midnights of admission. The following  factors support the patient status of inpatient.   " The patient's presenting symptoms include left lower quadrant abdominal tenderness and pain. " The worrisome physical exam findings include tenderness in the left lower quadrant with rebound. " The initial radiographic  and laboratory data are worrisome because of possible pelvic abscess with apparent diverticulitis. " The chronic co-morbidities include previous carcinoid tumor of the appendix.   * I certify that at the point of admission it is my clinical judgment that the patient will require inpatient hospital care spanning beyond 2 midnights from the point of admission due to high intensity of service, high risk for further deterioration and high frequency of surveillance required.Barbette Merino MD [facility] Pager [phone]  If 7PM-7AM, please contact night-coverage [email or web address] Password [facility]  02/28/2018, 12:31 AM

## 2018-03-01 DIAGNOSIS — D649 Anemia, unspecified: Secondary | ICD-10-CM

## 2018-03-01 DIAGNOSIS — F321 Major depressive disorder, single episode, moderate: Secondary | ICD-10-CM

## 2018-03-01 DIAGNOSIS — Z6841 Body Mass Index (BMI) 40.0 and over, adult: Secondary | ICD-10-CM

## 2018-03-01 DIAGNOSIS — C7A020 Malignant carcinoid tumor of the appendix: Secondary | ICD-10-CM

## 2018-03-01 DIAGNOSIS — B9689 Other specified bacterial agents as the cause of diseases classified elsewhere: Secondary | ICD-10-CM

## 2018-03-01 DIAGNOSIS — R3989 Other symptoms and signs involving the genitourinary system: Secondary | ICD-10-CM

## 2018-03-01 DIAGNOSIS — E876 Hypokalemia: Secondary | ICD-10-CM

## 2018-03-01 DIAGNOSIS — N760 Acute vaginitis: Secondary | ICD-10-CM

## 2018-03-01 DIAGNOSIS — K5732 Diverticulitis of large intestine without perforation or abscess without bleeding: Principal | ICD-10-CM

## 2018-03-01 LAB — CBC WITH DIFFERENTIAL/PLATELET
Basophils Absolute: 0 10*3/uL (ref 0.0–0.1)
Basophils Relative: 0 %
EOS ABS: 0.1 10*3/uL (ref 0.0–0.7)
Eosinophils Relative: 2 %
HCT: 31.2 % — ABNORMAL LOW (ref 36.0–46.0)
Hemoglobin: 10.1 g/dL — ABNORMAL LOW (ref 12.0–15.0)
LYMPHS ABS: 1.4 10*3/uL (ref 0.7–4.0)
Lymphocytes Relative: 32 %
MCH: 29.7 pg (ref 26.0–34.0)
MCHC: 32.4 g/dL (ref 30.0–36.0)
MCV: 91.8 fL (ref 78.0–100.0)
MONOS PCT: 6 %
Monocytes Absolute: 0.3 10*3/uL (ref 0.1–1.0)
Neutro Abs: 2.6 10*3/uL (ref 1.7–7.7)
Neutrophils Relative %: 60 %
Platelets: 221 10*3/uL (ref 150–400)
RBC: 3.4 MIL/uL — AB (ref 3.87–5.11)
RDW: 13.6 % (ref 11.5–15.5)
WBC: 4.3 10*3/uL (ref 4.0–10.5)

## 2018-03-01 LAB — COMPREHENSIVE METABOLIC PANEL
ALT: 13 U/L (ref 0–44)
ANION GAP: 5 (ref 5–15)
AST: 11 U/L — ABNORMAL LOW (ref 15–41)
Albumin: 2.5 g/dL — ABNORMAL LOW (ref 3.5–5.0)
Alkaline Phosphatase: 31 U/L — ABNORMAL LOW (ref 38–126)
BUN: 6 mg/dL (ref 6–20)
CHLORIDE: 109 mmol/L (ref 98–111)
CO2: 25 mmol/L (ref 22–32)
Calcium: 7.5 mg/dL — ABNORMAL LOW (ref 8.9–10.3)
Creatinine, Ser: 0.48 mg/dL (ref 0.44–1.00)
GFR calc non Af Amer: >60 mL/min (ref >60)
Glucose, Bld: 112 mg/dL — ABNORMAL HIGH (ref 70–99)
Potassium: 3.6 mmol/L (ref 3.5–5.1)
SODIUM: 139 mmol/L (ref 135–145)
Total Bilirubin: 0.4 mg/dL (ref 0.3–1.2)
Total Protein: 5.9 g/dL — ABNORMAL LOW (ref 6.5–8.1)

## 2018-03-01 LAB — PHOSPHORUS: PHOSPHORUS: 1.6 mg/dL — AB (ref 2.5–4.6)

## 2018-03-01 LAB — MAGNESIUM: Magnesium: 1.9 mg/dL (ref 1.7–2.4)

## 2018-03-01 LAB — HIV ANTIBODY (ROUTINE TESTING W REFLEX): HIV Screen 4th Generation wRfx: NONREACTIVE

## 2018-03-01 MED ORDER — SODIUM CHLORIDE 0.9 % IV SOLN
1.0000 g | Freq: Once | INTRAVENOUS | Status: AC
  Administered 2018-03-01: 1 g via INTRAVENOUS
  Filled 2018-03-01: qty 10

## 2018-03-01 MED ORDER — METRONIDAZOLE 500 MG PO TABS
500.0000 mg | ORAL_TABLET | Freq: Two times a day (BID) | ORAL | Status: DC
  Administered 2018-03-01 – 2018-03-02 (×3): 500 mg via ORAL
  Filled 2018-03-01 (×3): qty 1

## 2018-03-01 MED ORDER — CIPROFLOXACIN IN D5W 400 MG/200ML IV SOLN
400.0000 mg | Freq: Two times a day (BID) | INTRAVENOUS | Status: DC
  Administered 2018-03-01 – 2018-03-02 (×4): 400 mg via INTRAVENOUS
  Filled 2018-03-01 (×5): qty 200

## 2018-03-01 MED ORDER — POTASSIUM PHOSPHATES 15 MMOLE/5ML IV SOLN
20.0000 mmol | Freq: Once | INTRAVENOUS | Status: AC
  Administered 2018-03-01: 20 mmol via INTRAVENOUS
  Filled 2018-03-01: qty 6.67

## 2018-03-01 NOTE — Progress Notes (Signed)
Patient ID: [name] [name], female   DOB: 14-Jan-1981, 37 y.o.   MRN: [id number]   Acute Care Surgery Service Progress Note:    Chief Complaint/Subjective: Family member at bs Had some crampy lower abd pain this am but had a BM and felt much better No n/v No fever  Objective: Vital signs in last 24 hours: Temp:  [97.8 F (36.6 C)-98.9 F (37.2 C)] 97.8 F (36.6 C) (09/28 0604) Pulse Rate:  [68-73] 68 (09/28 0604) Resp:  [17-20] 20 (09/28 0604) BP: (92-125)/(61-73) 92/61 (09/28 0604) SpO2:  [95 %-97 %] 97 % (09/28 0604) Last BM Date: 02/28/18  Intake/Output from previous day: 09/27 0701 - 09/28 0700 In: 3067.6 [I.V.:2667.6; IV Piggyback:400] Out: 0  Intake/Output this shift: Total I/O In: 300 [IV Piggyback:300] Out: -   Lungs: cta, nonlabored  Cardiovascular: reg  Abd: obese, soft, mild LLQ TTP; no guarding  Extremities: no edema, +SCDs  Neuro: alert, nonfocal  Gen: not ill appearing Lab Results: CBC  Recent Labs    02/28/18 0607 03/01/18 0535  WBC 6.6 4.3  HGB 11.2* 10.1*  HCT 33.7* 31.2*  PLT 223 221   BMET Recent Labs    02/28/18 0607 03/01/18 0535  NA 140 139  K 3.1* 3.6  CL 109 109  CO2 24 25  GLUCOSE 93 112*  BUN 7 6  CREATININE 0.61 0.48  CALCIUM 7.7* 7.5*   LFT Hepatic Function Latest Ref Rng & Units 03/01/2018 02/28/2018 02/27/2018  Total Protein 6.5 - 8.1 g/dL 5.9(L) 6.3(L) 7.2  Albumin 3.5 - 5.0 g/dL 2.5(L) 2.7(L) 3.1(L)  AST 15 - 41 U/L 11(L) 12(L) 14(L)  ALT 0 - 44 U/L '13 14 16  '$ Alk Phosphatase 38 - 126 U/L 31(L) 34(L) 37(L)  Total Bilirubin 0.3 - 1.2 mg/dL 0.4 0.8 0.6  Bilirubin, Direct 0.0 - 0.2 mg/dL - - 0.1   PT/INR No results for input(s): LABPROT, INR in the last 72 hours. ABG No results for input(s): PHART, HCO3 in the last 72 hours.  Invalid input(s): PCO2, PO2  Studies/Results:  Anti-infectives: Anti-infectives (From admission, onward)   Start     Dose/Rate Route Frequency Ordered Stop   03/01/18 1000   metroNIDAZOLE (FLAGYL) tablet 500 mg     500 mg Oral Every 12 hours 03/01/18 0827 03/08/18 0959   03/01/18 1000  ciprofloxacin (CIPRO) IVPB 400 mg     400 mg 200 mL/hr over 60 Minutes Intravenous Every 12 hours 03/01/18 0849     02/28/18 0045  meropenem (MERREM) 1 g in sodium chloride 0.9 % 100 mL IVPB  Status:  Discontinued     1 g 200 mL/hr over 30 Minutes Intravenous Every 8 hours 02/28/18 0032 03/01/18 0829   02/28/18 0000  piperacillin-tazobactam (ZOSYN) IVPB 3.375 g     3.375 g 100 mL/hr over 30 Minutes Intravenous  Once 02/27/18 2353 02/28/18 0139   02/27/18 2345  cefTRIAXone (ROCEPHIN) 2 g in sodium chloride 0.9 % 100 mL IVPB  Status:  Discontinued     2 g 200 mL/hr over 30 Minutes Intravenous  Once 02/27/18 2333 02/27/18 2353   02/27/18 2345  metroNIDAZOLE (FLAGYL) IVPB 500 mg  Status:  Discontinued     500 mg 100 mL/hr over 60 Minutes Intravenous  Once 02/27/18 2333 02/27/18 2353   02/27/18 0000  amoxicillin-clavulanate (AUGMENTIN) 875-125 MG tablet     1 tablet Oral Every 12 hours 02/27/18 2349 03/09/18 2359      Medications: Scheduled Meds: . amitriptyline  12.5 mg  Oral QHS  . heparin  5,000 Units Subcutaneous Q8H  . metroNIDAZOLE  500 mg Oral Q12H  . norgestimate-ethinyl estradiol  1 tablet Oral Daily  . venlafaxine XR  75 mg Oral Q breakfast   Continuous Infusions: . ciprofloxacin Stopped (03/01/18 1136)  . dextrose 5 % and 0.9% NaCl 100 mL/hr at 03/01/18 0911  . potassium PHOSPHATE IVPB (in mmol) 20 mmol (03/01/18 1153)   PRN Meds:.ibuprofen, ketorolac, morphine injection, ondansetron **OR** ondansetron (ZOFRAN) IV  Assessment/Plan: Patient Active Problem List   Diagnosis Date Noted  . Hypokalemia 02/28/2018  . UTI (urinary tract infection) 02/28/2018  . Pelvic abscess in female 02/28/2018  . CIN III (cervical intraepithelial neoplasia grade III) with severe dysplasia 11/25/2017  . Ventral hernia without obstruction or gangrene 07/15/2017  . Insomnia,  unspecified 02/18/2017  . Moderate major depression ([place]) 01/07/2017  . Gallstone pancreatitis 12/20/2012  . Colon perforation ([place]) 12/09/2012  . Carcinoid tumor of appendix, malignant ([place]) 12/09/2012  . Class 3 severe obesity with body mass index (BMI) of 45.0 to 49.9 in adult ([place]) 08/08/2007  . Migraine headache 02/28/2007   Diverticulitis - 2 bout (last bout 09/2016, no colonoscopy) -no fever, no wbc, less pain, less tender - start clears, IV abx, IVF and hopefully this will resolve without need for emergent surgery - pt is well know by Dr. Marlou Starks so recommend f/u in 6-8 weeks to discuss elective partial colectomy of sigmoid after colonoscopy  FEN: clears VTE: SCD's, heparin ID: Zosyn 09/27 Merrem 09/27; cipro/flagyl Foley:none Follow up: TBD  Disposition: start clears; discussed diverticular dis with pt    LOS: 1 day    Leighton Ruff. Redmond Pulling, MD, FACS General, Bariatric, & Minimally Invasive Surgery [phone] [facility]

## 2018-03-01 NOTE — Progress Notes (Signed)
Pharmacy Antibiotic Note  [name] [name] is a 37 y.o. female admitted on 02/27/2018 with Intra-abdominal infection.  Pharmacy has been consulted for Cipro dosing.  Plan: Cipro 400 mg IV q12h Flagyl 500 mg q12h (MD)  Height: 5\' 4"  (162.6 cm) Weight: 284 lb 12.8 oz (129.2 kg) IBW/kg (Calculated) : 54.7  Temp (24hrs), Avg:98.4 F (36.9 C), Min:97.8 F (36.6 C), Max:98.9 F (37.2 C)  Recent Labs  Lab 02/27/18 1918 02/27/18 2125 02/28/18 0607 03/01/18 0535  WBC 9.8  --  6.6 4.3  CREATININE 0.69  --  0.61 0.48  LATICACIDVEN  --  1.22  --   --     Estimated Creatinine Clearance: 128.4 mL/min (by C-G formula based on SCr of 0.48 mg/dL).    No Known Allergies  Antimicrobials this admission: Meropenem 02/28/2018 >> 9/28 Cipro 9/28 >> Flagyl 9/28 >>  Dose adjustments this admission: -  Microbiology results: -  Thank you for allowing pharmacy to be a part of this patient's care.  Dorrene German 03/01/2018 8:51 AM

## 2018-03-01 NOTE — Progress Notes (Signed)
PROGRESS NOTE    [name] [name]  MCN:[id number] DOB: 29-Jun-1980 DOA: 02/27/2018 PCP: [place], Betty G, MD   Brief Narrative:  The patient is a 37 year old female with past mental history significant for depression, morbid obesity, history of C-section with cecal perforation 5 days after C-section status post partial colectomy by Dr. Marlou Starks, history of carcinoid tumor of the appendix, status post cholecystectomy, gallstone pancreatitis, history of diverticulitis and other comorbidities who presents with throbbing intermittent left lower quadrant pain that is been intermittent for over a week that progressively worse in the last 2 days sharp pain.  States this episode is less painful than when she had and back in April 2018 however she was further evaluated and work up in the ED revealed that she had acute sigmoid diverticulitis with concern for developing abscess.    She was admitted Acute Sigmoid Diverticulitis with associated suspected pelvic abscess but ultrasound showed a small left ovarian cyst measuring up to 1.2 cm consistent with physiological follicular cysts and no discrete collection or abscess was associated and identified with a sigmoid diverticulitis.  General surgery was consulted and patient was made n.p.o. and placed on bowel rest and started on IV antibiotics as well with IV Meropenem and Abx have been changed to Cipro/Flagyl. Patient states that she is feeling better after coming in and she is currently getting IV fluid resuscitation with normal saline at 100 and mils per hour and diet was advanced to Clear Liquids Today.   Assessment & Plan:   Active Problems:   Class 3 severe obesity with body mass index (BMI) of 45.0 to 49.9 in adult [facility])   Carcinoid tumor of appendix, malignant (HCC)   Moderate major depression (HCC)   Hypokalemia   UTI (urinary tract infection)   Suspected UTI   Bacterial vaginosis   Hypophosphatemia   Hypocalcemia   Normocytic anemia  Acute Sigmoid  Diverticulitis -CT scan of the abdomen and pelvis showed acute sigmoid diverticulitis and fluid attenuating structure adjacent to the sigmoid colon in the region of left ovary that may represent ovarian follicle versus a developing abscess -Ultrasound transvaginal and transpelvic Doppler showed small left ovarian cyst measuring 1.2 cm most likely be consistent with physiological follicular cyst.  There is no other discrete collection or abscess associated with the previously identified sigmoid diverticulitis identified no evidence of ovarian torsion and 1.5 cm fundal fibroid -N.p.o. we will change to clear liquid diet by general surgery -This is patient's second bout of diverticulitis and will need to have a discussion with Dr. Marlou Starks in outpatient -IV meropenem changed to IV Ciprofloxacin with p.o. Flagyl 500 mg p.o. every 12 -Continue with IV fluid hydration with D5W normal saline at rate 100 and mils per hour -Pain control with IV ketorolac 30 mg every 6 as needed and IV morphine 4 mg every 2 PRN for severe pain -Antiemetics with Zofran 4 mg p.o./IV every 6 as needed -Has been afebrile with no leukocytosis  Questionable urinary tract infection versus pyuria; favoring pyuria however still will treat with antibiotics as above -Patient is asymptomatic but urinalysis was reflective of a urinary tract infection -Urine culture showed greater than 100,000 colonies gram-negative rods E. coli -Sensitivities are still pending -IV meropenem changed to IV ciprofloxacin should be sensitive based on prior urine culture results  Bacterial Vaginosis -Had a wet prep done in the ED and showed clue cells -IV meropenem is now discontinued and patient was started on p.o. metronidazole 500 mg every 12 x7 days  Depression  and Anxiety -Continue with venlafaxine 75 mg p.o. daily with breakfast and amitriptyline 12.5 mg p.o. nightly  History of carcinoid tumor of the Appendix -Has been stable so far -Follow-up with  outpatient oncology   Hypophosphatemia -Patient's phosphorus level this morning was 1.6 -Replete with IV K-Phos 20 mmol -Continue to monitor and replete as necessary -Repeat phosphorus level in the a.m.  Hypocalcemia -Patient's calcium level 7.5 -Replete with IV calcium gluconate 1 g -We will need to check an ionized calcium level -Repeat CMP in a.m.  Normocytic Anemia -Patient's hemoglobin/hematocrit went from 12.5/37.1 and now dropped to 10.1/31.2 -Check anemia panel in the a.m. -Continue monitor for signs and symptoms of bleeding -Repeat CBC in the a.m.  Morbid Obesity -Estimated body mass index is 48.89 kg/m as calculated from the following:   Height as of this encounter: 5\' 4"  (1.626 m).   Weight as of this encounter: 129.2 kg. -Weight loss counseling given  DVT prophylaxis: Heparin 5000 units subcu every 8 hours Code Status: FULL CODE Family Communication: No family present at bedside Disposition Plan: Pending clinical improvement and clearance by general surgery  Consultants:   General Surgery    Procedures: None   Antimicrobials:  Anti-infectives (From admission, onward)   Start     Dose/Rate Route Frequency Ordered Stop   03/01/18 1000  metroNIDAZOLE (FLAGYL) tablet 500 mg     500 mg Oral Every 12 hours 03/01/18 0827 03/08/18 0959   03/01/18 1000  ciprofloxacin (CIPRO) IVPB 400 mg     400 mg 200 mL/hr over 60 Minutes Intravenous Every 12 hours 03/01/18 0849     02/28/18 0045  meropenem (MERREM) 1 g in sodium chloride 0.9 % 100 mL IVPB  Status:  Discontinued     1 g 200 mL/hr over 30 Minutes Intravenous Every 8 hours 02/28/18 0032 03/01/18 0829   02/28/18 0000  piperacillin-tazobactam (ZOSYN) IVPB 3.375 g     3.375 g 100 mL/hr over 30 Minutes Intravenous  Once 02/27/18 2353 02/28/18 0139   02/27/18 2345  cefTRIAXone (ROCEPHIN) 2 g in sodium chloride 0.9 % 100 mL IVPB  Status:  Discontinued     2 g 200 mL/hr over 30 Minutes Intravenous  Once 02/27/18  2333 02/27/18 2353   02/27/18 2345  metroNIDAZOLE (FLAGYL) IVPB 500 mg  Status:  Discontinued     500 mg 100 mL/hr over 60 Minutes Intravenous  Once 02/27/18 2333 02/27/18 2353   02/27/18 0000  amoxicillin-clavulanate (AUGMENTIN) 875-125 MG tablet     1 tablet Oral Every 12 hours 02/27/18 2349 03/09/18 2359     Subjective: Seen and examined at bedside and states that she is still having some crampy abdominal pain but is passing gas and did have a bowel movement.  No chest pain, lightheadedness or dizziness.  States her headache is improved and she is does feel better.  No other concerns or complaints at this time.  Objective: Vitals:   02/28/18 2029 03/01/18 0604 03/01/18 1431 03/01/18 1432  BP: 104/65 92/61  (!) 103/58  Pulse: 73 68  70  Resp: 17 20  19   Temp: 98.9 F (37.2 C) 97.8 F (36.6 C) 98.5 F (36.9 C) 98.5 F (36.9 C)  TempSrc: Oral Oral Oral Oral  SpO2: 97% 97%  97%  Weight:      Height:        Intake/Output Summary (Last 24 hours) at 03/01/2018 1715 Last data filed at 03/01/2018 1400 Gross per 24 hour  Intake 2478.2 ml  Output -  Net 2478.2 ml   Filed Weights   02/27/18 1832  Weight: 129.2 kg   Examination: Physical Exam:  Constitutional: WN/WD morbidly obese Caucasian female NAD and appears calm and comfortable Eyes: Lids and conjunctivae normal, sclerae anicteric  ENMT: External Ears, Nose appear normal. Grossly normal hearing.   Neck: Appears normal, supple, no cervical masses, normal ROM, no appreciable thyromegaly; no JVD Respiratory: Diminished to auscultation bilaterally, no wheezing, rales, rhonchi or crackles. Normal respiratory effort and patient is not tachypenic. No accessory muscle use.  Cardiovascular: RRR, no murmurs / rubs / gallops. S1 and S2 auscultated. Trace extremity edema.  Abdomen: Soft, slightly tender, Distended 2/2 body habitus. No masses palpated. No appreciable hepatosplenomegaly. Bowel sounds positiv x4e.  GU:  Deferred. Musculoskeletal: No clubbing / cyanosis of digits/nails. No joint deformity upper and lower extremities. Skin: No rashes, lesions, ulcers on a limited skin evaluation. No induration; Warm and dry.  Neurologic: CN 2-12 grossly intact with no focal deficits.  Romberg sign and cerebellar reflexes not assessed.  Psychiatric: Normal judgment and insight. Alert and oriented x 3. Normal mood and appropriate affect.   Data Reviewed: I have personally reviewed following labs and imaging studies  CBC: Recent Labs  Lab 02/27/18 1918 02/28/18 0607 03/01/18 0535  WBC 9.8 6.6 4.3  NEUTROABS  --   --  2.6  HGB 12.5 11.2* 10.1*  HCT 37.1 33.7* 31.2*  MCV 90.3 90.6 91.8  PLT 286 223 193   Basic Metabolic Panel: Recent Labs  Lab 02/27/18 1918 02/28/18 0607 03/01/18 0535  NA 143 140 139  K 3.4* 3.1* 3.6  CL 108 109 109  CO2 26 24 25   GLUCOSE 95 93 112*  BUN 8 7 6   CREATININE 0.69 0.61 0.48  CALCIUM 8.8* 7.7* 7.5*  MG  --   --  1.9  PHOS  --   --  1.6*   GFR: Estimated Creatinine Clearance: 128.4 mL/min (by C-G formula based on SCr of 0.48 mg/dL). Liver Function Tests: Recent Labs  Lab 02/27/18 1925 02/28/18 0607 03/01/18 0535  AST 14* 12* 11*  ALT 16 14 13   ALKPHOS 37* 34* 31*  BILITOT 0.6 0.8 0.4  PROT 7.2 6.3* 5.9*  ALBUMIN 3.1* 2.7* 2.5*   Recent Labs  Lab 02/27/18 1925  LIPASE 30   No results for input(s): AMMONIA in the last 168 hours. Coagulation Profile: No results for input(s): INR, PROTIME in the last 168 hours. Cardiac Enzymes: No results for input(s): CKTOTAL, CKMB, CKMBINDEX, TROPONINI in the last 168 hours. BNP (last 3 results) No results for input(s): PROBNP in the last 8760 hours. HbA1C: No results for input(s): HGBA1C in the last 72 hours. CBG: Recent Labs  Lab 02/27/18 1837 02/28/18 1616  GLUCAP 85 98   Lipid Profile: No results for input(s): CHOL, HDL, LDLCALC, TRIG, CHOLHDL, LDLDIRECT in the last 72 hours. Thyroid Function  Tests: No results for input(s): TSH, T4TOTAL, FREET4, T3FREE, THYROIDAB in the last 72 hours. Anemia Panel: No results for input(s): VITAMINB12, FOLATE, FERRITIN, TIBC, IRON, RETICCTPCT in the last 72 hours. Sepsis Labs: Recent Labs  Lab 02/27/18 2125  LATICACIDVEN 1.22    Recent Results (from the past 240 hour(s))  Urine culture     Status: Abnormal (Preliminary result)   Collection Time: 02/27/18  9:41 PM  Result Value Ref Range Status   Specimen Description   Final    URINE, CLEAN CATCH Performed at [facility], [place] [address] [place], [place] [address]  Special Requests   Final    Normal Performed at [facility], [place] [address], [place], [place] [address]    Culture >=100,000 COLONIES/mL ESCHERICHIA COLI (A)  Final   Report Status PENDING  Incomplete  Wet prep, genital     Status: Abnormal   Collection Time: 02/27/18 11:35 PM  Result Value Ref Range Status   Yeast Wet Prep HPF POC NONE SEEN NONE SEEN Final   Trich, Wet Prep NONE SEEN NONE SEEN Final   Clue Cells Wet Prep HPF POC PRESENT (A) NONE SEEN Final   WBC, Wet Prep HPF POC FEW (A) NONE SEEN Final   Sperm NONE SEEN  Final    Comment: Performed at [facility], [place] [address], [place], [place] [address]    Radiology Studies: US Transvaginal Non-ob  Result Date: 02/28/2018 CLINICAL DATA:  Initial evaluation for acute left lower quadrant abdominal pain. Abnormal CT. EXAM: TRANSABDOMINAL AND TRANSVAGINAL ULTRASOUND OF PELVIS DOPPLER ULTRASOUND OF OVARIES TECHNIQUE: Both transabdominal and transvaginal ultrasound examinations of the pelvis were performed. Transabdominal technique was performed for global imaging of the pelvis including uterus, ovaries, adnexal regions, and pelvic cul-de-sac. It was necessary to proceed with endovaginal exam following the transabdominal exam to visualize the uterus, endometrium, and ovaries. Color and duplex Doppler ultrasound was  utilized to evaluate blood flow to the ovaries. COMPARISON:  Prior CT from 02/27/2018. FINDINGS: Uterus Measurements: 8.1 x 4.9 x 5.4 cm. Intramural fundal fibroid measuring 1.3 x 1.5 x 1.2 cm noted. Endometrium Thickness: 7.5 mm.  No focal abnormality visualized. Right ovary Measurements: 3.2 x 2.5 x 2.1 cm. Normal appearance/no adnexal mass. Left ovary Measurements: 3.6 x 2.6 x 2.4 cm. 1.1 x 1.0 x 1.2 cm predominantly simple cyst, favored to reflect a small physiologic follicular dominant follicle. Additional smaller adjacent cyst also felt to be most consistent with a small normal physiologic follicle. No other definite discrete collection or abscess identified. Pulsed Doppler evaluation of both ovaries demonstrates normal low-resistance arterial and venous waveforms. Other findings No abnormal free fluid. IMPRESSION: 1. Small left ovarian cysts measuring up to 1.2 cm, felt to be most consistent with normal physiologic follicular cysts. 2. No other discrete collection or abscess associated with previously identified sigmoid diverticulitis identified. 3. No evidence for ovarian torsion. 4. 1.5 cm fundal fibroid. Electronically Signed   By: Jeannine Boga M.D.   On: 02/28/2018 02:13   US Pelvis Complete  Result Date: 02/28/2018 CLINICAL DATA:  Initial evaluation for acute left lower quadrant abdominal pain. Abnormal CT. EXAM: TRANSABDOMINAL AND TRANSVAGINAL ULTRASOUND OF PELVIS DOPPLER ULTRASOUND OF OVARIES TECHNIQUE: Both transabdominal and transvaginal ultrasound examinations of the pelvis were performed. Transabdominal technique was performed for global imaging of the pelvis including uterus, ovaries, adnexal regions, and pelvic cul-de-sac. It was necessary to proceed with endovaginal exam following the transabdominal exam to visualize the uterus, endometrium, and ovaries. Color and duplex Doppler ultrasound was utilized to evaluate blood flow to the ovaries. COMPARISON:  Prior CT from 02/27/2018.  FINDINGS: Uterus Measurements: 8.1 x 4.9 x 5.4 cm. Intramural fundal fibroid measuring 1.3 x 1.5 x 1.2 cm noted. Endometrium Thickness: 7.5 mm.  No focal abnormality visualized. Right ovary Measurements: 3.2 x 2.5 x 2.1 cm. Normal appearance/no adnexal mass. Left ovary Measurements: 3.6 x 2.6 x 2.4 cm. 1.1 x 1.0 x 1.2 cm predominantly simple cyst, favored to reflect a small physiologic follicular dominant follicle. Additional smaller adjacent cyst also felt to be most consistent with a small normal  physiologic follicle. No other definite discrete collection or abscess identified. Pulsed Doppler evaluation of both ovaries demonstrates normal low-resistance arterial and venous waveforms. Other findings No abnormal free fluid. IMPRESSION: 1. Small left ovarian cysts measuring up to 1.2 cm, felt to be most consistent with normal physiologic follicular cysts. 2. No other discrete collection or abscess associated with previously identified sigmoid diverticulitis identified. 3. No evidence for ovarian torsion. 4. 1.5 cm fundal fibroid. Electronically Signed   By: Jeannine Boga M.D.   On: 02/28/2018 02:13   Ct Abdomen Pelvis W Contrast  Result Date: 02/27/2018 CLINICAL DATA:  37 year old female with nausea vomiting and abdominal pain. EXAM: CT ABDOMEN AND PELVIS WITH CONTRAST TECHNIQUE: Multidetector CT imaging of the abdomen and pelvis was performed using the standard protocol following bolus administration of intravenous contrast. CONTRAST:  135mL ISOVUE-300 IOPAMIDOL (ISOVUE-300) INJECTION 61% COMPARISON:  CT of the abdomen pelvis dated 09/28/2016 FINDINGS: Lower chest: The visualized lung bases are clear. No intra-abdominal free air.  Small free fluid within the pelvis. Hepatobiliary: The liver is unremarkable. No intrahepatic biliary ductal dilatation. Cholecystectomy. The common bile duct measures 13 mm. No calcified stone noted in the central CBD. Pancreas: Unremarkable. No pancreatic ductal dilatation or  surrounding inflammatory changes. Spleen: There is a 4.5 cm inferior splenic cyst. Adrenals/Urinary Tract: The adrenal glands are unremarkable. There is no hydronephrosis on either side. The visualized ureters and urinary bladder appear unremarkable. Stomach/Bowel: There is sigmoid diverticulosis without active inflammatory changes compatible with acute diverticulitis. There is loss of fat plane between the sigmoid colon and the left ovary which may represent a degree of adhesion. There is a somewhat tubular appearing low attenuating structure adjacent to the sigmoid colon and in the region of the left ovary. Although this may represent ovarian follicle or normal ovarian tissue, an abscess is not excluded. This measures approximately 3.2 x 2.7 cm. Vascular/Lymphatic: No significant vascular findings are present. No enlarged abdominal or pelvic lymph nodes. Reproductive: The uterus is anteverted and grossly unremarkable. There is mildly enlargement of the left ovary with inflammatory changes secondary to inflammation of this sigmoid diverticula. Low attenuating structure in the region of the left ovary, may represent follicle or developing abscess. Other: Midline vertical anterior pelvic wall incisional scar. Musculoskeletal: No acute or significant osseous findings. IMPRESSION: Acute sigmoid diverticulitis. Fluid attenuating structure adjacent to the sigmoid colon in the region of the left ovary may represent ovarian follicle versus developing abscess. Clinical correlation is recommended. Electronically Signed   By: Anner Crete M.D.   On: 02/27/2018 23:01   [place] Art/ven Flow Abd Pelv Doppler  Result Date: 02/28/2018 CLINICAL DATA:  Initial evaluation for acute left lower quadrant abdominal pain. Abnormal CT. EXAM: TRANSABDOMINAL AND TRANSVAGINAL ULTRASOUND OF PELVIS DOPPLER ULTRASOUND OF OVARIES TECHNIQUE: Both transabdominal and transvaginal ultrasound examinations of the pelvis were performed.  Transabdominal technique was performed for global imaging of the pelvis including uterus, ovaries, adnexal regions, and pelvic cul-de-sac. It was necessary to proceed with endovaginal exam following the transabdominal exam to visualize the uterus, endometrium, and ovaries. Color and duplex Doppler ultrasound was utilized to evaluate blood flow to the ovaries. COMPARISON:  Prior CT from 02/27/2018. FINDINGS: Uterus Measurements: 8.1 x 4.9 x 5.4 cm. Intramural fundal fibroid measuring 1.3 x 1.5 x 1.2 cm noted. Endometrium Thickness: 7.5 mm.  No focal abnormality visualized. Right ovary Measurements: 3.2 x 2.5 x 2.1 cm. Normal appearance/no adnexal mass. Left ovary Measurements: 3.6 x 2.6 x 2.4 cm. 1.1 x 1.0 x 1.2  cm predominantly simple cyst, favored to reflect a small physiologic follicular dominant follicle. Additional smaller adjacent cyst also felt to be most consistent with a small normal physiologic follicle. No other definite discrete collection or abscess identified. Pulsed Doppler evaluation of both ovaries demonstrates normal low-resistance arterial and venous waveforms. Other findings No abnormal free fluid. IMPRESSION: 1. Small left ovarian cysts measuring up to 1.2 cm, felt to be most consistent with normal physiologic follicular cysts. 2. No other discrete collection or abscess associated with previously identified sigmoid diverticulitis identified. 3. No evidence for ovarian torsion. 4. 1.5 cm fundal fibroid. Electronically Signed   By: Jeannine Boga M.D.   On: 02/28/2018 02:13   Scheduled Meds: . amitriptyline  12.5 mg Oral QHS  . heparin  5,000 Units Subcutaneous Q8H  . metroNIDAZOLE  500 mg Oral Q12H  . norgestimate-ethinyl estradiol  1 tablet Oral Daily  . venlafaxine XR  75 mg Oral Q breakfast   Continuous Infusions: . ciprofloxacin Stopped (03/01/18 1136)  . dextrose 5 % and 0.9% NaCl Stopped (03/01/18 1153)  . potassium PHOSPHATE IVPB (in mmol) 20 mmol (03/01/18 1153)    LOS:  1 day   Kerney Elbe, DO [facility] PAGER is on [place]  If 7PM-7AM, please contact night-coverage [email or web address] Password [facility] 03/01/2018, 5:15 PM

## 2018-03-02 DIAGNOSIS — N390 Urinary tract infection, site not specified: Secondary | ICD-10-CM

## 2018-03-02 LAB — COMPREHENSIVE METABOLIC PANEL
ALT: 14 U/L (ref 0–44)
AST: 15 U/L (ref 15–41)
Albumin: 2.6 g/dL — ABNORMAL LOW (ref 3.5–5.0)
Alkaline Phosphatase: 33 U/L — ABNORMAL LOW (ref 38–126)
Anion gap: 6 (ref 5–15)
BUN: <5 mg/dL — ABNORMAL LOW (ref 6–20)
CHLORIDE: 109 mmol/L (ref 98–111)
CO2: 25 mmol/L (ref 22–32)
CREATININE: 0.58 mg/dL (ref 0.44–1.00)
Calcium: 8.2 mg/dL — ABNORMAL LOW (ref 8.9–10.3)
GFR calc Af Amer: >60 mL/min (ref >60)
Glucose, Bld: 108 mg/dL — ABNORMAL HIGH (ref 70–99)
POTASSIUM: 3.9 mmol/L (ref 3.5–5.1)
Sodium: 140 mmol/L (ref 135–145)
TOTAL PROTEIN: 6.2 g/dL — AB (ref 6.5–8.1)
Total Bilirubin: 0.4 mg/dL (ref 0.3–1.2)

## 2018-03-02 LAB — CBC WITH DIFFERENTIAL/PLATELET
Basophils Absolute: 0 10*3/uL (ref 0.0–0.1)
Basophils Relative: 0 %
EOS PCT: 1 %
Eosinophils Absolute: 0.1 10*3/uL (ref 0.0–0.7)
HCT: 32.1 % — ABNORMAL LOW (ref 36.0–46.0)
Hemoglobin: 10.5 g/dL — ABNORMAL LOW (ref 12.0–15.0)
LYMPHS PCT: 39 %
Lymphs Abs: 1.4 10*3/uL (ref 0.7–4.0)
MCH: 29.7 pg (ref 26.0–34.0)
MCHC: 32.7 g/dL (ref 30.0–36.0)
MCV: 90.9 fL (ref 78.0–100.0)
MONO ABS: 0.2 10*3/uL (ref 0.1–1.0)
MONOS PCT: 5 %
Neutro Abs: 1.9 10*3/uL (ref 1.7–7.7)
Neutrophils Relative %: 55 %
PLATELETS: 234 10*3/uL (ref 150–400)
RBC: 3.53 MIL/uL — ABNORMAL LOW (ref 3.87–5.11)
RDW: 13.5 % (ref 11.5–15.5)
WBC: 3.5 10*3/uL — AB (ref 4.0–10.5)

## 2018-03-02 LAB — URINE CULTURE: SPECIAL REQUESTS: NORMAL

## 2018-03-02 LAB — IRON AND TIBC
IRON: 31 ug/dL (ref 28–170)
Saturation Ratios: 9 % — ABNORMAL LOW (ref 10.4–31.8)
TIBC: 340 ug/dL (ref 250–450)
UIBC: 309 ug/dL

## 2018-03-02 LAB — VITAMIN B12: Vitamin B-12: 176 pg/mL — ABNORMAL LOW (ref 180–914)

## 2018-03-02 LAB — RETICULOCYTES
RBC.: 3.53 MIL/uL — AB (ref 3.87–5.11)
RETIC COUNT ABSOLUTE: 56.5 10*3/uL (ref 19.0–186.0)
Retic Ct Pct: 1.6 % (ref 0.4–3.1)

## 2018-03-02 LAB — MAGNESIUM: MAGNESIUM: 1.8 mg/dL (ref 1.7–2.4)

## 2018-03-02 LAB — FERRITIN: FERRITIN: 43 ng/mL (ref 11–307)

## 2018-03-02 LAB — FOLATE: FOLATE: 9.7 ng/mL (ref >5.9)

## 2018-03-02 LAB — PHOSPHORUS: Phosphorus: 2.1 mg/dL — ABNORMAL LOW (ref 2.5–4.6)

## 2018-03-02 MED ORDER — METRONIDAZOLE 500 MG PO TABS
500.0000 mg | ORAL_TABLET | Freq: Three times a day (TID) | ORAL | Status: DC
  Administered 2018-03-02 – 2018-03-03 (×4): 500 mg via ORAL
  Filled 2018-03-02 (×4): qty 1

## 2018-03-02 MED ORDER — VITAMIN B-12 1000 MCG PO TABS
1000.0000 ug | ORAL_TABLET | Freq: Every day | ORAL | Status: DC
  Administered 2018-03-02 – 2018-03-03 (×2): 1000 ug via ORAL
  Filled 2018-03-02 (×2): qty 1

## 2018-03-02 MED ORDER — POTASSIUM PHOSPHATES 15 MMOLE/5ML IV SOLN
20.0000 mmol | Freq: Once | INTRAVENOUS | Status: AC
  Administered 2018-03-02: 20 mmol via INTRAVENOUS
  Filled 2018-03-02: qty 6.67

## 2018-03-02 NOTE — Progress Notes (Signed)
Patient ID: Marzetta Merino, female   DOB: 1981/05/27, 37 y.o.   MRN: [id number]   Acute Care Surgery Service Progress Note:    Chief Complaint/Subjective: No pain No n/v Having BMs Did well with clears Family inroom Objective: Vital signs in last 24 hours: Temp:  [97.7 F (36.5 C)-98.6 F (37 C)] 98.6 F (37 C) (09/29 0446) Pulse Rate:  [65-70] 65 (09/29 0446) Resp:  [18-20] 18 (09/29 0446) BP: (103-111)/(58-62) 111/60 (09/29 0446) SpO2:  [97 %-100 %] 97 % (09/29 0446) Last BM Date: 03/01/18  Intake/Output from previous day: 09/28 0701 - 09/29 0700 In: 2732.6 [P.O.:360; I.V.:1195.1; IV Piggyback:1177.6] Out: -  Intake/Output this shift: No intake/output data recorded.  Lungs: cta, nonlabored  Cardiovascular: reg  Abd: soft, obese, NT, no guarding  Extremities: no edema, +SCDs  Neuro: alert, nonfocal  Lab Results: CBC  Recent Labs    03/01/18 0535 03/02/18 0556  WBC 4.3 3.5*  HGB 10.1* 10.5*  HCT 31.2* 32.1*  PLT 221 234   BMET Recent Labs    03/01/18 0535 03/02/18 0556  NA 139 140  K 3.6 3.9  CL 109 109  CO2 25 25  GLUCOSE 112* 108*  BUN 6 <5*  CREATININE 0.48 0.58  CALCIUM 7.5* 8.2*   LFT Hepatic Function Latest Ref Rng & Units 03/02/2018 03/01/2018 02/28/2018  Total Protein 6.5 - 8.1 g/dL 6.2(L) 5.9(L) 6.3(L)  Albumin 3.5 - 5.0 g/dL 2.6(L) 2.5(L) 2.7(L)  AST 15 - 41 U/L 15 11(L) 12(L)  ALT 0 - 44 U/L '14 13 14  '$ Alk Phosphatase 38 - 126 U/L 33(L) 31(L) 34(L)  Total Bilirubin 0.3 - 1.2 mg/dL 0.4 0.4 0.8  Bilirubin, Direct 0.0 - 0.2 mg/dL - - -   PT/INR No results for input(s): LABPROT, INR in the last 72 hours. ABG No results for input(s): PHART, HCO3 in the last 72 hours.  Invalid input(s): PCO2, PO2  Studies/Results:  Anti-infectives: Anti-infectives (From admission, onward)   Start     Dose/Rate Route Frequency Ordered Stop   03/01/18 1000  metroNIDAZOLE (FLAGYL) tablet 500 mg     500 mg Oral Every 12 hours 03/01/18 0827 03/08/18  0959   03/01/18 1000  ciprofloxacin (CIPRO) IVPB 400 mg     400 mg 200 mL/hr over 60 Minutes Intravenous Every 12 hours 03/01/18 0849     02/28/18 0045  meropenem (MERREM) 1 g in sodium chloride 0.9 % 100 mL IVPB  Status:  Discontinued     1 g 200 mL/hr over 30 Minutes Intravenous Every 8 hours 02/28/18 0032 03/01/18 0829   02/28/18 0000  piperacillin-tazobactam (ZOSYN) IVPB 3.375 g     3.375 g 100 mL/hr over 30 Minutes Intravenous  Once 02/27/18 2353 02/28/18 0139   02/27/18 2345  cefTRIAXone (ROCEPHIN) 2 g in sodium chloride 0.9 % 100 mL IVPB  Status:  Discontinued     2 g 200 mL/hr over 30 Minutes Intravenous  Once 02/27/18 2333 02/27/18 2353   02/27/18 2345  metroNIDAZOLE (FLAGYL) IVPB 500 mg  Status:  Discontinued     500 mg 100 mL/hr over 60 Minutes Intravenous  Once 02/27/18 2333 02/27/18 2353   02/27/18 0000  amoxicillin-clavulanate (AUGMENTIN) 875-125 MG tablet     1 tablet Oral Every 12 hours 02/27/18 2349 03/09/18 2359      Medications: Scheduled Meds: . amitriptyline  12.5 mg Oral QHS  . heparin  5,000 Units Subcutaneous Q8H  . metroNIDAZOLE  500 mg Oral Q12H  . norgestimate-ethinyl estradiol  1 tablet Oral Daily  . venlafaxine XR  75 mg Oral Q breakfast   Continuous Infusions: . ciprofloxacin 400 mg (03/02/18 0919)  . dextrose 5 % and 0.9% NaCl 100 mL/hr at 03/02/18 0433  . potassium PHOSPHATE IVPB (in mmol) 20 mmol (03/02/18 1116)   PRN Meds:.ibuprofen, ketorolac, morphine injection, ondansetron **OR** ondansetron (ZOFRAN) IV  Assessment/Plan: Patient Active Problem List   Diagnosis Date Noted  . Suspected UTI 03/01/2018  . Bacterial vaginosis 03/01/2018  . Hypophosphatemia 03/01/2018  . Hypocalcemia 03/01/2018  . Normocytic anemia 03/01/2018  . Hypokalemia 02/28/2018  . UTI (urinary tract infection) 02/28/2018  . Pelvic abscess in female 02/28/2018  . CIN III (cervical intraepithelial neoplasia grade III) with severe dysplasia 11/25/2017  . Ventral  hernia without obstruction or gangrene 07/15/2017  . Insomnia, unspecified 02/18/2017  . Moderate major depression ([place]) 01/07/2017  . Gallstone pancreatitis 12/20/2012  . Colon perforation ([place]) 12/09/2012  . Carcinoid tumor of appendix, malignant ([place]) 12/09/2012  . Class 3 severe obesity with body mass index (BMI) of 45.0 to 49.9 in adult ([place]) 08/08/2007  . Migraine headache 02/28/2007   Diverticulitis - 2 bout (last bout 09/2016, no colonoscopy) -no fever, no wbc, less pain, less tender -  clears, IV abx, IVF and hopefully this will resolve without need for emergent surgery - pt is well know by Dr. Marlou Starks so recommend f/u in 6-8 weeks to discuss elective partial colectomy of sigmoid after colonoscopy  VHA:WUJNWM-->GEE to fulls VTE: SCD's,heparin AT:VVLRT 09/27 Merrem 09/27; cipro/flagyl Foley:none Follow up:TBD  Disposition: agree with adv diet to fulls. If does well today, soft diet in am and probable home on Monday on oral abx.   LOS: 2 days    Leighton Ruff. Redmond Pulling, MD, FACS General, Bariatric, & Minimally Invasive Surgery [phone] [facility]

## 2018-03-02 NOTE — Progress Notes (Signed)
PROGRESS NOTE    [name] [name]  AUQ:[id number] DOB: 10/10/1980 DOA: 02/27/2018 PCP: [place], Betty G, MD   Brief Narrative:  The patient is a 37 year old female with past mental history significant for depression, morbid obesity, history of C-section with cecal perforation 5 days after C-section status post partial colectomy by Dr. Marlou Starks, history of carcinoid tumor of the appendix, status post cholecystectomy, gallstone pancreatitis, history of diverticulitis and other comorbidities who presents with throbbing intermittent left lower quadrant pain that is been intermittent for over a week that progressively worse in the last 2 days sharp pain.  States this episode is less painful than when she had and back in April 2018 however she was further evaluated and work up in the ED revealed that she had acute sigmoid diverticulitis with concern for developing abscess.    She was admitted Acute Sigmoid Diverticulitis with associated suspected pelvic abscess but ultrasound showed a small left ovarian cyst measuring up to 1.2 cm consistent with physiological follicular cysts and no discrete collection or abscess was associated and identified with a sigmoid diverticulitis.  General surgery was consulted and patient was made n.p.o. and placed on bowel rest and started on IV antibiotics as well with IV Meropenem and Abx have been changed to Cipro/Flagyl. Patient states that she is feeling better after coming in and she is currently getting IV fluid resuscitation with D5W normal saline at 100 and mils per hour and diet was advanced to Clear Liquids yesterday.  Will advance diet to full liquid diet today and if tolerating well can advance to soft in the a.m. and will decrease IV fluid rate today  Assessment & Plan:   Active Problems:   Class 3 severe obesity with body mass index (BMI) of 45.0 to 49.9 in adult [facility])   Carcinoid tumor of appendix, malignant ([facility])   Moderate major depression ([facility])   Hypokalemia  UTI (urinary tract infection)   Suspected UTI   Bacterial vaginosis   Hypophosphatemia   Hypocalcemia   Normocytic anemia  Acute Sigmoid Diverticulitis -CT scan of the abdomen and pelvis showed acute sigmoid diverticulitis and fluid attenuating structure adjacent to the sigmoid colon in the region of left ovary that may represent ovarian follicle versus a developing abscess -Ultrasound transvaginal and transpelvic Doppler showed small left ovarian cyst measuring 1.2 cm most likely be consistent with physiological follicular cyst.  There is no other discrete collection or abscess associated with the previously identified sigmoid diverticulitis identified no evidence of ovarian torsion and 1.5 cm fundal fibroid -Was n.p.o. but advance diet to clear liquid yesterday by general surgery.  This morning I advance her diet to full liquid diet and if tolerating well can advance to soft in the a.m. -This is patient's second bout of diverticulitis and will need to have a discussion with Dr. Marlou Starks in outpatient -IV meropenem changed to IV Ciprofloxacin with p.o. Flagyl 500 mg p.o. every 12 -Continue with IV fluid hydration with D5W normal saline but will reduce rate to 75 mL's per hour -Pain control with IV ketorolac 30 mg every 6 as needed and IV morphine 4 mg every 2 PRN for severe pain -Antiemetics with Zofran 4 mg p.o./IV every 6 as needed -Has been afebrile with no leukocytosis and states that her pain is improved with  Questionable urinary tract infection versus pyuria; favoring pyuria however still will treat with antibiotics as above -Patient is asymptomatic but urinalysis was reflective of a urinary tract infection -Urine culture showed greater than 100,000 colonies  gram-negative rods E. coli -Sensitivities show that it is pansensitive -IV meropenem changed to IV ciprofloxacin should be sensitive based on prior urine culture results  Bacterial Vaginosis -Had a wet prep done in the ED and showed  clue cells -IV meropenem is now discontinued and patient was started on p.o. metronidazole 500 mg every 12 hours  x7 days  Depression and Anxiety -Continue with venlafaxine 75 mg p.o. daily with breakfast and amitriptyline 12.5 mg p.o. nightly  History of carcinoid tumor of the Appendix -Has been stable so far -Follow-up with outpatient oncology   Hypophosphatemia -Patient's phosphorus level this morning was 2.1 -Replete with IV K-Phos 20 mmol again -Continue to monitor and replete as necessary -Repeat phosphorus level in the a.m.  Hypocalcemia -Patient's calcium level was 7.5 and this AM was 8.2 -Replete with IV calcium gluconate 1 g yesterday  -We will need to check an ionized calcium level -Repeat CMP in a.m.  Normocytic Anemia -Patient's hemoglobin/hematocrit went from 12.5/37.1 and now dropped to 10.5/32.1 -Checked anemia panel and showed iron level of 31, U IBC of 309, TIBC of 340, saturation ratio of 9%, ferritin level 43, folate level 9.7, and vitamin B12 level 176 -We will start vitamin B12 supplementation with 1,000 mcg po daily -Continue monitor for signs and symptoms of bleeding -Repeat CBC in the a.m.  Morbid Obesity -Estimated body mass index is 48.89 kg/m as calculated from the following:   Height as of this encounter: 5\' 4"  (1.626 m).   Weight as of this encounter: 129.2 kg. -Weight loss counseling given  DVT prophylaxis: Heparin 5000 units subcu every 8 hours Code Status: FULL CODE Family Communication: Husband at bedside  Disposition Plan: Pending clinical improvement and clearance by general surgery; Anticipate D/C home in the next 24-48 hours if medically stable   Consultants:   General Surgery Dr. Redmond Pulling   Procedures: None   Antimicrobials:  Anti-infectives (From admission, onward)   Start     Dose/Rate Route Frequency Ordered Stop   03/02/18 1600  metroNIDAZOLE (FLAGYL) tablet 500 mg     500 mg Oral Every 8 hours 03/02/18 1150     03/01/18  1000  metroNIDAZOLE (FLAGYL) tablet 500 mg  Status:  Discontinued     500 mg Oral Every 12 hours 03/01/18 0827 03/02/18 1150   03/01/18 1000  ciprofloxacin (CIPRO) IVPB 400 mg     400 mg 200 mL/hr over 60 Minutes Intravenous Every 12 hours 03/01/18 0849     02/28/18 0045  meropenem (MERREM) 1 g in sodium chloride 0.9 % 100 mL IVPB  Status:  Discontinued     1 g 200 mL/hr over 30 Minutes Intravenous Every 8 hours 02/28/18 0032 03/01/18 0829   02/28/18 0000  piperacillin-tazobactam (ZOSYN) IVPB 3.375 g     3.375 g 100 mL/hr over 30 Minutes Intravenous  Once 02/27/18 2353 02/28/18 0139   02/27/18 2345  cefTRIAXone (ROCEPHIN) 2 g in sodium chloride 0.9 % 100 mL IVPB  Status:  Discontinued     2 g 200 mL/hr over 30 Minutes Intravenous  Once 02/27/18 2333 02/27/18 2353   02/27/18 2345  metroNIDAZOLE (FLAGYL) IVPB 500 mg  Status:  Discontinued     500 mg 100 mL/hr over 60 Minutes Intravenous  Once 02/27/18 2333 02/27/18 2353   02/27/18 0000  amoxicillin-clavulanate (AUGMENTIN) 875-125 MG tablet     1 tablet Oral Every 12 hours 02/27/18 2349 03/09/18 2359     Subjective: Seen and examined at bedside and states that  she is feeling better.  Denies any abdominal pain today.  No nausea, no vomiting.  No chest pain, lightheadedness or dizziness.  States headache is improved.  No other concerns or plans at this time and feels well  Objective: Vitals:   03/01/18 1431 03/01/18 1432 03/01/18 2003 03/02/18 0446  BP:  (!) 103/58 104/62 111/60  Pulse:  70 66 65  Resp:  19 20 18   Temp: 98.5 F (36.9 C) 98.5 F (36.9 C) 97.7 F (36.5 C) 98.6 F (37 C)  TempSrc: Oral Oral Oral Oral  SpO2:  97% 100% 97%  Weight:      Height:        Intake/Output Summary (Last 24 hours) at 03/02/2018 1339 Last data filed at 03/02/2018 0500 Gross per 24 hour  Intake 2059.64 ml  Output -  Net 2059.64 ml   Filed Weights   02/27/18 1832  Weight: 129.2 kg   Examination: Physical Exam:  Constitutional:  Well-nourished, well-developed morbidly obese Caucasian female is currently in no acute distress peers, comfortable Eyes: Sclera anicteric.  Lids and conjunctive normal ENMT: External ears and nose appear normal.  Grossly normal hearing.  Mucous membranes are moist Neck: Supple with no JVD Respiratory: Diminished to auscultation bilaterally with no appreciable wheezing, rales, rhonchi.  Patient was not tachypneic wheezing excess muscle breathe Cardiovascular: Rate and rhythm.  No appreciable murmurs, rubs or gallops.  Mild lower extremity edema but is nonpitting Abdomen: Soft, nontender today.  Distended secondary body habitus.  Bowel sounds present in 4 quadrants GU: Deferred Musculoskeletal: No Contractures or cyanosis.  No joint deformities noted Skin: Skin is warm and dry no appreciable rashes lesions limited skin evaluation Neurologic: Cranial nerves II through XII grossly intact no appreciable focal deficits Psychiatric: Normal judgment and insight.  Patient is awake and alert and oriented x3.  Normal mood and affect  Data Reviewed: I have personally reviewed following labs and imaging studies  CBC: Recent Labs  Lab 02/27/18 1918 02/28/18 0607 03/01/18 0535 03/02/18 0556  WBC 9.8 6.6 4.3 3.5*  NEUTROABS  --   --  2.6 1.9  HGB 12.5 11.2* 10.1* 10.5*  HCT 37.1 33.7* 31.2* 32.1*  MCV 90.3 90.6 91.8 90.9  PLT 286 223 221 829   Basic Metabolic Panel: Recent Labs  Lab 02/27/18 1918 02/28/18 0607 03/01/18 0535 03/02/18 0556  NA 143 140 139 140  K 3.4* 3.1* 3.6 3.9  CL 108 109 109 109  CO2 26 24 25 25   GLUCOSE 95 93 112* 108*  BUN 8 7 6  <5*  CREATININE 0.69 0.61 0.48 0.58  CALCIUM 8.8* 7.7* 7.5* 8.2*  MG  --   --  1.9 1.8  PHOS  --   --  1.6* 2.1*   GFR: Estimated Creatinine Clearance: 128.4 mL/min (by C-G formula based on SCr of 0.58 mg/dL). Liver Function Tests: Recent Labs  Lab 02/27/18 1925 02/28/18 0607 03/01/18 0535 03/02/18 0556  AST 14* 12* 11* 15  ALT  16 14 13 14   ALKPHOS 37* 34* 31* 33*  BILITOT 0.6 0.8 0.4 0.4  PROT 7.2 6.3* 5.9* 6.2*  ALBUMIN 3.1* 2.7* 2.5* 2.6*   Recent Labs  Lab 02/27/18 1925  LIPASE 30   No results for input(s): AMMONIA in the last 168 hours. Coagulation Profile: No results for input(s): INR, PROTIME in the last 168 hours. Cardiac Enzymes: No results for input(s): CKTOTAL, CKMB, CKMBINDEX, TROPONINI in the last 168 hours. BNP (last 3 results) No results for input(s): PROBNP in  the last 8760 hours. HbA1C: No results for input(s): HGBA1C in the last 72 hours. CBG: Recent Labs  Lab 02/27/18 1837 02/28/18 1616  GLUCAP 85 98   Lipid Profile: No results for input(s): CHOL, HDL, LDLCALC, TRIG, CHOLHDL, LDLDIRECT in the last 72 hours. Thyroid Function Tests: No results for input(s): TSH, T4TOTAL, FREET4, T3FREE, THYROIDAB in the last 72 hours. Anemia Panel: Recent Labs    03/02/18 0556  VITAMINB12 176*  FOLATE 9.7  FERRITIN 43  TIBC 340  IRON 31  RETICCTPCT 1.6   Sepsis Labs: Recent Labs  Lab 02/27/18 2125  LATICACIDVEN 1.22   Recent Results (from the past 240 hour(s))  Urine culture     Status: Abnormal   Collection Time: 02/27/18  9:41 PM  Result Value Ref Range Status   Specimen Description   Final    URINE, CLEAN CATCH Performed at [facility] [address], [place], [place] [address]    Special Requests   Final    Normal Performed at [facility], [place] [address], [place], [place] [address]    Culture >=100,000 COLONIES/mL ESCHERICHIA COLI (A)  Final   Report Status 03/02/2018 FINAL  Final   Organism ID, Bacteria ESCHERICHIA COLI (A)  Final      Susceptibility   Escherichia coli - MIC*    AMPICILLIN <=2 SENSITIVE Sensitive     CEFAZOLIN <=4 SENSITIVE Sensitive     CEFTRIAXONE <=1 SENSITIVE Sensitive     CIPROFLOXACIN <=0.25 SENSITIVE Sensitive     GENTAMICIN <=1 SENSITIVE Sensitive     IMIPENEM <=0.25 SENSITIVE Sensitive      NITROFURANTOIN <=16 SENSITIVE Sensitive     TRIMETH/SULFA <=20 SENSITIVE Sensitive     AMPICILLIN/SULBACTAM <=2 SENSITIVE Sensitive     PIP/TAZO <=4 SENSITIVE Sensitive     Extended ESBL NEGATIVE Sensitive     * >=100,000 COLONIES/mL ESCHERICHIA COLI  Wet prep, genital     Status: Abnormal   Collection Time: 02/27/18 11:35 PM  Result Value Ref Range Status   Yeast Wet Prep HPF POC NONE SEEN NONE SEEN Final   Trich, Wet Prep NONE SEEN NONE SEEN Final   Clue Cells Wet Prep HPF POC PRESENT (A) NONE SEEN Final   WBC, Wet Prep HPF POC FEW (A) NONE SEEN Final   Sperm NONE SEEN  Final    Comment: Performed at [facility], [place] [address] [place], [place] [address]    Radiology Studies: No results found. Scheduled Meds: . amitriptyline  12.5 mg Oral QHS  . heparin  5,000 Units Subcutaneous Q8H  . metroNIDAZOLE  500 mg Oral Q8H  . norgestimate-ethinyl estradiol  1 tablet Oral Daily  . venlafaxine XR  75 mg Oral Q breakfast   Continuous Infusions: . ciprofloxacin 400 mg (03/02/18 0919)  . dextrose 5 % and 0.9% NaCl 100 mL/hr at 03/02/18 0433  . potassium PHOSPHATE IVPB (in mmol) 20 mmol (03/02/18 1116)    LOS: 2 days   Kerney Elbe, DO [facility] PAGER is on [place]  If 7PM-7AM, please contact night-coverage [email or web address] Password [email or web address] 03/02/2018, 1:39 PM

## 2018-03-03 LAB — COMPREHENSIVE METABOLIC PANEL
ALK PHOS: 32 U/L — AB (ref 38–126)
ALT: 17 U/L (ref 0–44)
ANION GAP: 7 (ref 5–15)
AST: 25 U/L (ref 15–41)
Albumin: 2.7 g/dL — ABNORMAL LOW (ref 3.5–5.0)
BUN: <5 mg/dL — ABNORMAL LOW (ref 6–20)
CO2: 26 mmol/L (ref 22–32)
Calcium: 8.5 mg/dL — ABNORMAL LOW (ref 8.9–10.3)
Chloride: 107 mmol/L (ref 98–111)
Creatinine, Ser: 0.61 mg/dL (ref 0.44–1.00)
GLUCOSE: 99 mg/dL (ref 70–99)
POTASSIUM: 3.6 mmol/L (ref 3.5–5.1)
Sodium: 140 mmol/L (ref 135–145)
TOTAL PROTEIN: 6.4 g/dL — AB (ref 6.5–8.1)
Total Bilirubin: 0.3 mg/dL (ref 0.3–1.2)

## 2018-03-03 LAB — CBC WITH DIFFERENTIAL/PLATELET
Basophils Absolute: 0 10*3/uL (ref 0.0–0.1)
Basophils Relative: 1 %
EOS PCT: 1 %
Eosinophils Absolute: 0 10*3/uL (ref 0.0–0.7)
HCT: 32.9 % — ABNORMAL LOW (ref 36.0–46.0)
Hemoglobin: 10.8 g/dL — ABNORMAL LOW (ref 12.0–15.0)
LYMPHS ABS: 1.7 10*3/uL (ref 0.7–4.0)
LYMPHS PCT: 42 %
MCH: 29.8 pg (ref 26.0–34.0)
MCHC: 32.8 g/dL (ref 30.0–36.0)
MCV: 90.9 fL (ref 78.0–100.0)
MONOS PCT: 6 %
Monocytes Absolute: 0.2 10*3/uL (ref 0.1–1.0)
Neutro Abs: 2 10*3/uL (ref 1.7–7.7)
Neutrophils Relative %: 50 %
Platelets: 261 10*3/uL (ref 150–400)
RBC: 3.62 MIL/uL — AB (ref 3.87–5.11)
RDW: 13.2 % (ref 11.5–15.5)
WBC: 4 10*3/uL (ref 4.0–10.5)

## 2018-03-03 LAB — MAGNESIUM: MAGNESIUM: 1.7 mg/dL (ref 1.7–2.4)

## 2018-03-03 LAB — PHOSPHORUS: PHOSPHORUS: 3.3 mg/dL (ref 2.5–4.6)

## 2018-03-03 MED ORDER — POTASSIUM CHLORIDE CRYS ER 20 MEQ PO TBCR
40.0000 meq | EXTENDED_RELEASE_TABLET | Freq: Once | ORAL | Status: AC
  Administered 2018-03-03: 40 meq via ORAL
  Filled 2018-03-03: qty 2

## 2018-03-03 MED ORDER — CIPROFLOXACIN HCL 500 MG PO TABS: 500.0000 mg | ORAL_TABLET | Freq: Two times a day (BID) | ORAL | 0 refills | Status: AC

## 2018-03-03 MED ORDER — CIPROFLOXACIN HCL 500 MG PO TABS
500.0000 mg | ORAL_TABLET | Freq: Two times a day (BID) | ORAL | Status: DC
  Administered 2018-03-03: 500 mg via ORAL
  Filled 2018-03-03: qty 1

## 2018-03-03 MED ORDER — MAGNESIUM SULFATE 2 GM/50ML IV SOLN
2.0000 g | Freq: Once | INTRAVENOUS | Status: DC
  Filled 2018-03-03: qty 50

## 2018-03-03 MED ORDER — METRONIDAZOLE 500 MG PO TABS: 500.0000 mg | ORAL_TABLET | Freq: Three times a day (TID) | ORAL | 0 refills | Status: AC

## 2018-03-03 MED ORDER — CYANOCOBALAMIN 1000 MCG PO TABS: 1000.0000 ug | ORAL_TABLET | Freq: Every day | ORAL | 0 refills | Status: DC

## 2018-03-03 MED ORDER — MAGNESIUM OXIDE 400 (241.3 MG) MG PO TABS: 400.0000 mg | ORAL_TABLET | Freq: Two times a day (BID) | ORAL | 0 refills | Status: AC

## 2018-03-03 MED ORDER — MAGNESIUM OXIDE 400 (241.3 MG) MG PO TABS
400.0000 mg | ORAL_TABLET | Freq: Two times a day (BID) | ORAL | Status: DC
  Administered 2018-03-03: 400 mg via ORAL
  Filled 2018-03-03: qty 1

## 2018-03-03 NOTE — Progress Notes (Signed)
Patient at this time no longer needs IV antibiotics, patient will be discharged today

## 2018-03-03 NOTE — Discharge Instructions (Signed)
Low-Fiber Diet °Fiber is found in fruits, vegetables, and whole grains. A low-fiber diet restricts fibrous foods that are not digested in the small intestine. A diet containing about 10-15 grams of fiber per day is considered low fiber. Low-fiber diets may be used to: °· Promote healing and rest the bowel during intestinal flare-ups. °· Prevent blockage of a partially obstructed or narrowed gastrointestinal tract. °· Reduce fecal weight and volume. °· Slow the movement of feces. ° °You may be on a low-fiber diet as a transitional diet following surgery, after an injury (trauma), or because of a short (acute) or lifelong (chronic) illness. Your health care provider will determine the length of time you need to stay on this diet. °What do I need to know about a low-fiber diet? °Always check the fiber content on the packaging's Nutrition Facts label, especially on foods from the grains list. Ask your dietitian if you have questions about specific foods that are related to your condition, especially if the food is not listed below. In general, a low-fiber food will have less than 2 g of fiber. °What foods can I eat? °Grains °All breads and crackers made with white flour. Sweet rolls, doughnuts, waffles, pancakes, French toast, bagels. Pretzels, Melba toast, zwieback. Well-cooked cereals, such as cornmeal, farina, or cream cereals. Dry cereals that do not contain whole grains, fruit, or nuts, such as refined corn, wheat, rice, and oat cereals. Potatoes prepared any way without skins, plain pastas and noodles, refined white rice. Use white flour for baking and making sauces. Use allowed list of grains for casseroles, dumplings, and puddings. °Vegetables °Strained tomato and vegetable juices. Fresh lettuce, cucumber, spinach. Well-cooked (no skin or pulp) or canned vegetables, such as asparagus, bean sprouts, beets, carrots, green beans, mushrooms, potatoes, pumpkin, spinach, yellow squash, tomato sauce/puree, turnips,  yams, and zucchini. Keep servings limited to ½ cup. °Fruits °All fruit juices except prune juice. Cooked or canned fruits without skin and seeds, such as applesauce, apricots, cherries, fruit cocktail, grapefruit, grapes, mandarin oranges, melons, peaches, pears, pineapple, and plums. Fresh fruits without skin, such as apricots, avocados, bananas, melons, pineapple, nectarines, and peaches. Keep servings limited to ½ cup or 1 piece. °Meat and Other Protein Sources °Ground or well-cooked tender beef, ham, veal, lamb, pork, or poultry. Eggs, plain cheese. Fish, oysters, shrimp, lobster, and other seafood. Liver, organ meats. Smooth nut butters. °Dairy °All milk products and alternative dairy substitutes, such as soy, rice, almond, and coconut, not containing added whole nuts, seeds, or added fruit. °Beverages °Decaf coffee, fruit, and vegetable juices or smoothies (small amounts, with no pulp or skins, and with fruits from allowed list), sports drinks, herbal tea. °Condiments °Ketchup, mustard, vinegar, cream sauce, cheese sauce, cocoa powder. Spices in moderation, such as allspice, basil, bay leaves, celery powder or leaves, cinnamon, cumin powder, curry powder, ginger, mace, marjoram, onion or garlic powder, oregano, paprika, parsley flakes, ground pepper, rosemary, sage, savory, tarragon, thyme, and turmeric. °Sweets and Desserts °Plain cakes and cookies, pie made with allowed fruit, pudding, custard, cream pie. Gelatin, fruit, ice, sherbet, frozen ice pops. Ice cream, ice milk without nuts. Plain hard candy, honey, jelly, molasses, syrup, sugar, chocolate syrup, gumdrops, marshmallows. Limit overall sugar intake. °Fats and Oil °Margarine, butter, cream, mayonnaise, salad oils, plain salad dressings made from allowed foods. Choose healthy fats such as olive oil, canola oil, and omega-3 fatty acids (such as found in salmon or tuna) when possible. °Other °Bouillon, broth, or cream soups made from allowed foods. Any    strained soup. Casseroles or mixed dishes made with allowed foods. The items listed above may not be a complete list of recommended foods or beverages. Contact your dietitian for more options. What foods are not recommended? Grains All whole wheat and whole grain breads and crackers. Multigrains, rye, bran seeds, nuts, or coconut. Cereals containing whole grains, multigrains, bran, coconut, nuts, raisins. Cooked or dry oatmeal, steel-cut oats. Coarse wheat cereals, granola. Cereals advertised as high fiber. Potato skins. Whole grain pasta, wild or brown rice. Popcorn. Coconut flour. Bran, buckwheat, corn bread, multigrains, rye, wheat germ. Vegetables Fresh, cooked or canned vegetables, such as artichokes, asparagus, beet greens, broccoli, Brussels sprouts, cabbage, celery, cauliflower, corn, eggplant, kale, legumes or beans, okra, peas, and tomatoes. Avoid large servings of any vegetables, especially raw vegetables. Fruits Fresh fruits, such as apples with or without skin, berries, cherries, figs, grapes, grapefruit, guavas, kiwis, mangoes, oranges, papayas, pears, persimmons, pineapple, and pomegranate. Prune juice and juices with pulp, stewed or dried prunes. Dried fruits, dates, raisins. Fruit seeds or skins. Avoid large servings of all fresh fruits. Meats and Other Protein Sources Tough, fibrous meats with gristle. Chunky nut butter. Cheese made with seeds, nuts, or other foods not recommended. Nuts, seeds, legumes (beans, including baked beans), dried peas, beans, lentils. Dairy Yogurt or cheese that contains nuts, seeds, or added fruit. Beverages Fruit juices with high pulp, prune juice. Caffeinated coffee and teas. Condiments Coconut, maple syrup, pickles, olives. Sweets and Desserts Desserts, cookies, or candies that contain nuts or coconut, chunky peanut butter, dried fruits. Jams, preserves with seeds, marmalade. Large amounts of sugar and sweets. Any other dessert made with fruits from  the not recommended list. Other Soups made from vegetables that are not recommended or that contain other foods not recommended. The items listed above may not be a complete list of foods and beverages to avoid. Contact your dietitian for more information. This information is not intended to replace advice given to you by your health care provider. Make sure you discuss any questions you have with your health care provider. Document Released: 11/10/2001 Document Revised: 10/27/2015 Document Reviewed: 04/13/2013 Elsevier Interactive Patient Education  2017 [employer]    Diverticulitis Diverticulitis is infection or inflammation of small pouches (diverticula) in the colon that form due to a condition called diverticulosis. Diverticula can trap stool (feces) and bacteria, causing infection and inflammation. Diverticulitis may cause severe stomach pain and diarrhea. It may lead to tissue damage in the colon that causes bleeding. The diverticula may also burst (rupture) and cause infected stool to enter other areas of the abdomen. Complications of diverticulitis can include:  Bleeding.  Severe infection.  Severe pain.  Rupture (perforation) of the colon.  Blockage (obstruction) of the colon.  What are the causes? This condition is caused by stool becoming trapped in the diverticula, which allows bacteria to grow in the diverticula. This leads to inflammation and infection. What increases the risk? You are more likely to develop this condition if:  You have diverticulosis. The risk for diverticulosis increases if: ? You are overweight or obese. ? You use tobacco products. ? You do not get enough exercise.  You eat a diet that does not include enough fiber. High-fiber foods include fruits, vegetables, beans, nuts, and whole grains.  What are the signs or symptoms? Symptoms of this condition may include:  Pain and tenderness in the abdomen. The pain is normally located on the left  side of the abdomen, but it may occur in other areas.  Fever and chills.  Bloating.  Cramping.  Nausea.  Vomiting.  Changes in bowel routines.  Blood in your stool.  How is this diagnosed? This condition is diagnosed based on:  Your medical history.  A physical exam.  Tests to make sure there is nothing else causing your condition. These tests may include: ? Blood tests. ? Urine tests. ? Imaging tests of the abdomen, including X-rays, ultrasounds, MRIs, or CT scans.  How is this treated? Most cases of this condition are mild and can be treated at home. Treatment may include:  Taking over-the-counter pain medicines.  Following a clear liquid diet.  Taking antibiotic medicines by mouth.  Rest.  More severe cases may need to be treated at a hospital. Treatment may include:  Not eating or drinking.  Taking prescription pain medicine.  Receiving antibiotic medicines through an IV tube.  Receiving fluids and nutrition through an IV tube.  Surgery.  When your condition is under control, your health care provider may recommend that you have a colonoscopy. This is an exam to look at the entire large intestine. During the exam, a lubricated, bendable tube is inserted into the anus and then passed into the rectum, colon, and other parts of the large intestine. A colonoscopy can show how severe your diverticula are and whether something else may be causing your symptoms. Follow these instructions at home: Medicines  Take over-the-counter and prescription medicines only as told by your health care provider. These include fiber supplements, probiotics, and stool softeners.  If you were prescribed an antibiotic medicine, take it as told by your health care provider. Do not stop taking the antibiotic even if you start to feel better.  Do not drive or use heavy machinery while taking prescription pain medicine. General instructions  Follow a full liquid diet or another  diet as directed by your health care provider. After your symptoms improve, your health care provider may tell you to change your diet. He or she may recommend that you eat a diet that contains at least 25 g (25 grams) of fiber daily. Fiber makes it easier to pass stool. Healthy sources of fiber include: ? Berries. One cup contains 4-8 grams of fiber. ? Beans or lentils. One half cup contains 5-8 grams of fiber. ? Green vegetables. One cup contains 4 grams of fiber.  Exercise for at least 30 minutes, 3 times each week. You should exercise hard enough to raise your heart rate and break a sweat.  Keep all follow-up visits as told by your health care provider. This is important. You may need a colonoscopy. Contact a health care provider if:  Your pain does not improve.  You have a hard time drinking or eating food.  Your bowel movements do not return to normal. Get help right away if:  Your pain gets worse.  Your symptoms do not get better with treatment.  Your symptoms suddenly get worse.  You have a fever.  You vomit more than one time.  You have stools that are bloody, black, or tarry. Summary  Diverticulitis is infection or inflammation of small pouches (diverticula) in the colon that form due to a condition called diverticulosis. Diverticula can trap stool (feces) and bacteria, causing infection and inflammation.  You are at higher risk for this condition if you have diverticulosis and you eat a diet that does not include enough fiber.  Most cases of this condition are mild and can be treated at home. More severe cases may need  to be treated at a hospital.  When your condition is under control, your health care provider may recommend that you have an exam called a colonoscopy. This exam can show how severe your diverticula are and whether something else may be causing your symptoms. This information is not intended to replace advice given to you by your health care provider.  Make sure you discuss any questions you have with your health care provider. Document Released: 02/28/2005 Document Revised: 06/23/2016 Document Reviewed: 06/23/2016 Elsevier Interactive Patient Education  [employer].

## 2018-03-03 NOTE — Progress Notes (Signed)
[name] [name] to be D/C'd Home per MD order.  Discussed prescriptions and follow up appointments with the patient. Prescriptions given to patient, medication list explained in detail. Pt verbalized understanding.  Allergies as of 03/03/2018   No Known Allergies     Medication List    STOP taking these medications   HYDROcodone-acetaminophen 5-325 MG tablet Commonly known as:  NORCO/VICODIN     TAKE these medications   amitriptyline 25 MG tablet Commonly known as:  ELAVIL Take 0.5 tablets (12.5 mg total) by mouth at bedtime.   ciprofloxacin 500 MG tablet Commonly known as:  CIPRO Take 1 tablet (500 mg total) by mouth 2 (two) times daily for 10 days.   cyanocobalamin 1000 MCG tablet Take 1 tablet (1,000 mcg total) by mouth daily. Start taking on:  03/04/2018   ibuprofen 200 MG tablet Commonly known as:  ADVIL,MOTRIN Take 400 mg by mouth every 6 (six) hours as needed for headache or moderate pain.   magnesium oxide 400 (241.3 Mg) MG tablet Commonly known as:  MAG-OX Take 1 tablet (400 mg total) by mouth 2 (two) times daily.   metroNIDAZOLE 500 MG tablet Commonly known as:  FLAGYL Take 1 tablet (500 mg total) by mouth every 8 (eight) hours for 10 days.   norgestimate-ethinyl estradiol 0.25-35 MG-MCG tablet Commonly known as:  ORTHO-CYCLEN,SPRINTEC,PREVIFEM Take 1 tablet by mouth daily.   ondansetron 4 MG disintegrating tablet Commonly known as:  ZOFRAN-ODT Take 1 tablet (4 mg total) by mouth every 8 (eight) hours as needed for nausea or vomiting.   oxyCODONE-acetaminophen 5-325 MG tablet Commonly known as:  PERCOCET/ROXICET Take 1-2 tablets by mouth every 4 (four) hours as needed for moderate pain or severe pain.   venlafaxine XR 75 MG 24 hr capsule Commonly known as:  EFFEXOR-XR Take 1 capsule (75 mg total) by mouth daily with breakfast.       Vitals:   03/02/18 2038 03/03/18 0436  BP: 119/79 (!) 103/55  Pulse: 60 60  Resp: 18 18  Temp: 98.1 F (36.7 C)  98.4 F (36.9 C)  SpO2: 100% 97%    Skin clean, dry and intact without evidence of skin break down, no evidence of skin tears noted. IV catheter discontinued intact. Site without signs and symptoms of complications. Dressing and pressure applied. Pt denies pain at this time. No complaints noted.  An After Visit Summary was printed and given to the patient. Patient escorted via [place], and D/C home via private auto.  Nonie Hoyer S 03/03/2018 2:01 PM

## 2018-03-03 NOTE — Progress Notes (Signed)
[facility]/Trauma Progress Note      Assessment/Plan Diverticulitis - 2 bout(last bout 09/2016, no colonoscopy) -no fever, no wbc, less pain, less tender -hopefully this will resolve without need for emergent surgery - pt is well know by Dr. Marlou Starks so recommend f/u in 6-8 weeks to discuss elective partial colectomy of sigmoid after colonoscopy  OVF:IEPP diet VTE: SCD's,heparin IR:JJOACZYSA PO  Foley:none Follow up:Dr. Marlou Starks  Disposition:WBC WNL. Okay for discharge from surgical standpoint. Total 14days abx, low fiber diet.     LOS: 3 days    Subjective: CC: diverticulitis   No abdominal pain. No nausea, vomiting, fever, chills. Tolerating diet.   Objective: Vital signs in last 24 hours: Temp:  [98.1 F (36.7 C)-98.4 F (36.9 C)] 98.4 F (36.9 C) (09/30 0436) Pulse Rate:  [60-69] 60 (09/30 0436) Resp:  [18] 18 (09/30 0436) BP: (103-119)/(55-79) 103/55 (09/30 0436) SpO2:  [97 %-100 %] 97 % (09/30 0436) Last BM Date: 03/02/18  Intake/Output from previous day: 09/29 0701 - 09/30 0700 In: 2006.4 [P.O.:840; I.V.:432.8; IV Piggyback:733.7] Out: -  Intake/Output this shift: No intake/output data recorded.  PE: Gen:  Alert, NAD, pleasant, cooperative Pulm:  Rate and effort normal Abd: Soft, NT/ND, +BS, no HSM Skin: no rashes noted, warm and dry   Anti-infectives: Anti-infectives (From admission, onward)   Start     Dose/Rate Route Frequency Ordered Stop   03/03/18 1130  ciprofloxacin (CIPRO) tablet 500 mg     500 mg Oral 2 times daily 03/03/18 1050     03/02/18 1600  metroNIDAZOLE (FLAGYL) tablet 500 mg     500 mg Oral Every 8 hours 03/02/18 1150     03/01/18 1000  metroNIDAZOLE (FLAGYL) tablet 500 mg  Status:  Discontinued     500 mg Oral Every 12 hours 03/01/18 0827 03/02/18 1150   03/01/18 1000  ciprofloxacin (CIPRO) IVPB 400 mg  Status:  Discontinued     400 mg 200 mL/hr over 60 Minutes Intravenous Every 12 hours 03/01/18 0849 03/03/18  1050   02/28/18 0045  meropenem (MERREM) 1 g in sodium chloride 0.9 % 100 mL IVPB  Status:  Discontinued     1 g 200 mL/hr over 30 Minutes Intravenous Every 8 hours 02/28/18 0032 03/01/18 0829   02/28/18 0000  piperacillin-tazobactam (ZOSYN) IVPB 3.375 g     3.375 g 100 mL/hr over 30 Minutes Intravenous  Once 02/27/18 2353 02/28/18 0139   02/27/18 2345  cefTRIAXone (ROCEPHIN) 2 g in sodium chloride 0.9 % 100 mL IVPB  Status:  Discontinued     2 g 200 mL/hr over 30 Minutes Intravenous  Once 02/27/18 2333 02/27/18 2353   02/27/18 2345  metroNIDAZOLE (FLAGYL) IVPB 500 mg  Status:  Discontinued     500 mg 100 mL/hr over 60 Minutes Intravenous  Once 02/27/18 2333 02/27/18 2353   02/27/18 0000  amoxicillin-clavulanate (AUGMENTIN) 875-125 MG tablet     1 tablet Oral Every 12 hours 02/27/18 2349 03/09/18 2359      Lab Results:  Recent Labs    03/02/18 0556 03/03/18 0552  WBC 3.5* 4.0  HGB 10.5* 10.8*  HCT 32.1* 32.9*  PLT 234 261   BMET Recent Labs    03/02/18 0556 03/03/18 0552  NA 140 140  K 3.9 3.6  CL 109 107  CO2 25 26  GLUCOSE 108* 99  BUN <5* <5*  CREATININE 0.58 0.61  CALCIUM 8.2* 8.5*   PT/INR No results for input(s): LABPROT, INR in the last  72 hours. CMP     Component Value Date/Time   NA 140 03/03/2018 0552   K 3.6 03/03/2018 0552   CL 107 03/03/2018 0552   CO2 26 03/03/2018 0552   GLUCOSE 99 03/03/2018 0552   BUN <5 (L) 03/03/2018 0552   CREATININE 0.61 03/03/2018 0552   CALCIUM 8.5 (L) 03/03/2018 0552   PROT 6.4 (L) 03/03/2018 0552   ALBUMIN 2.7 (L) 03/03/2018 0552   AST 25 03/03/2018 0552   ALT 17 03/03/2018 0552   ALKPHOS 32 (L) 03/03/2018 0552   BILITOT 0.3 03/03/2018 0552   GFRNONAA >60 03/03/2018 0552   GFRAA >60 03/03/2018 0552   Lipase     Component Value Date/Time   LIPASE 30 02/27/2018 1925    Studies/Results: No results found.    Kalman Drape , [facility] 03/03/2018, 11:20 AM  Pager:  [phone] Mon-Wed, Friday 7:00am-4:30pm Thurs 7am-11:30am  Consults: [phone]

## 2018-03-03 NOTE — Discharge Summary (Signed)
Physician Discharge Summary  [name] [name] HDQ:[id number] DOB: [birth year] DOA: 02/27/2018  PCP: Martinique, Betty G, MD  Admit date: 02/27/2018 Discharge date: 03/03/2018  Admitted From: Home Disposition: Home  Recommendations for Outpatient Follow-up:  1. Follow up with PCP in 1-2 weeks 2. Follow up with General Surgery Dr. Marlou Starks within 1-2 weeks 3. Low Fiber Diet 4. Please obtain CMP/CBC, Mag, Phos in one week 5. Please follow up on the following pending results:  Home Health: No Equipment/Devices: None  Discharge Condition: Stable  CODE STATUS: FULL CODE Diet recommendation: Soft Low Fat Diet   Brief/Interim Summary: The patient is a 37 year old female with past mental history significant for depression, morbid obesity, history of C-section with cecal perforation 5 days after C-section status post partial colectomy by Dr. Geanie Berlin of carcinoid tumor of the appendix, status post cholecystectomy, gallstone pancreatitis, history of diverticulitis and other comorbidities who presents with throbbing intermittent left lower quadrant pain that is been intermittent for over a week that progressively worse in the last 2 days sharp pain. States this episode is less painful than when she had and back in April 2018 however she was further evaluated and work upin the ED revealed that she had acute sigmoid diverticulitis with concern for developing abscess.   She was admitted Acute Sigmoid Diverticulitis with associated suspected pelvic abscess but ultrasound showed a small left ovarian cyst measuring up to 1.2 cm consistent with physiological follicular cysts and no discrete collection or abscess was associated and identified with a sigmoid diverticulitis. General surgery was consulted and patient was made n.p.o. and placed on bowel rest and started on IV antibiotics as well with IV Meropenem and Abx have been changed to Cipro/Flagyl. Patient states that she is feeling better after coming in and  she was currently getting IV fluid resuscitation with D5W normal saline at 75 mL per hour and diet was advanced to Clear Liquids  and then the folds identified a soft this morning.   Is deemed medically stable to be discharged and will need to follow-up with PCP and General Surgery outpatient setting  Discharge Diagnoses:  Active Problems:   Class 3 severe obesity with body mass index (BMI) of 45.0 to 49.9 in adult [facility])   Carcinoid tumor of appendix, malignant (HCC)   Moderate major depression (HCC)   Hypokalemia   UTI (urinary tract infection)   Suspected UTI   Bacterial vaginosis   Hypophosphatemia   Hypocalcemia   Normocytic anemia  Acute Sigmoid Diverticulitis -CT scan of the abdomen and pelvis showed acute sigmoid diverticulitis and fluid attenuating structure adjacent to the sigmoid colon in the region of left ovary that may represent ovarian follicle versus a developing abscess -Ultrasound transvaginal and transpelvic Doppler showed small left ovarian cyst measuring 1.2 cm most likely be consistent with physiological follicular cyst.  There is no other discrete collection or abscess associated with the previously identified sigmoid diverticulitis identified no evidence of ovarian torsion and 1.5 cm fundal fibroid -Was n.p.o. but advance diet to clear liquid yesterday by general surgery.  Yesterday morning I advanced her diet to full liquid diet and this AM advanced to Soft and she tolerated it well -This is patient's second bout of Diverticulitis and will need to have a discussion with Dr. Marlou Starks in outpatient -IV meropenem changed to IV Ciprofloxacin with p.o. Flagyl 500 mg p.o. every 8h; Will change to po Cipro 500 mg po BID x15 days  -Continue with IV fluid hydration with D5W normal saline but will reduce  rate to 75 mL's per hour and now stop -Pain control with IV ketorolac 30 mg every 6 as needed and IV morphine 4 mg every 2 PRN for severe pain -Antiemetics with Zofran 4 mg p.o./IV  every 6 as needed -Has been afebrile with no leukocytosis and states that her pain is improved. -Improved in general surgery evaluated and she was deemed medically stable to be discharged and will need a total of 14 days of antibiotics low fiber diet and follow-up with General Surgery and PCP in outpatient setting  Questionable urinary tract infection versus Bacteruria with pyuria; favoring pyuria however still will treat with antibiotics as above -Patient is asymptomatic but urinalysis was reflective of a urinary tract infection -Urine culture showed greater than 100,000 colonies gram-negative rods E. coli -Sensitivities show that it is pansensitive -IV meropenem changed to IV ciprofloxacin and sensitive. Continue and change to po Ciprofloxacin  Bacterial Vaginosis -Had a wet prep done in the ED and showed clue cells -IV meropenem is now discontinued and patient was started on p.o. metronidazole 500 mg every q8h for Diverticulitis  -Follow up with PCP   Depression and Anxiety -Continue with Venlafaxine 75 mg p.o. daily with breakfast and Amitriptyline 12.5 mg p.o. nightly  History of carcinoid tumor of the Appendix -Has been stable so far -Follow-up with outpatient Oncology   Hypophosphatemia -Patient's phosphorus level was 2.1 and improved to 3.3 -Continue to monitor and replete as necessary -Repeat phosphorus level in the a.m.  Hypocalcemia -Patient's calcium level was 7.5 and this AM was 8.5  -We will need to check an ionized calcium level -Repeat CMP in a.m.  Normocytic Anemia -Patient's hemoglobin/hematocrit went from 12.5/37.1 and now dropped to 10.8/32.9 -Checked anemia panel and showed iron level of 31, U IBC of 309, TIBC of 340, saturation ratio of 9%, ferritin level 43, folate level 9.7, and vitamin B12 level 176 -We will start vitamin B12 supplementation with 1,000 mcg po daily -Continue monitor for signs and symptoms of bleeding -Repeat CBC in the  a.m.  Morbid Obesity -Estimated body mass index is 48.89 kg/m as calculated from the following:   Height as of this encounter: 5\' 4"  (1.626 m).   Weight as of this encounter: 129.2 kg. -Weight loss counseling given  Discharge Instructions Discharge Instructions    Call MD for:  difficulty breathing, headache or visual disturbances   Complete by:  As directed    Call MD for:  extreme fatigue   Complete by:  As directed    Call MD for:  hives   Complete by:  As directed    Call MD for:  persistant dizziness or light-headedness   Complete by:  As directed    Call MD for:  persistant nausea and vomiting   Complete by:  As directed    Call MD for:  redness, tenderness, or signs of infection (pain, swelling, redness, odor or green/yellow discharge around incision site)   Complete by:  As directed    Call MD for:  severe uncontrolled pain   Complete by:  As directed    Call MD for:  temperature >100.4   Complete by:  As directed    Diet - low sodium heart healthy   Complete by:  As directed    LOW FIBER SOFT DIET   Discharge instructions   Complete by:  As directed    You were cared for by a hospitalist during your hospital stay. If you have any questions about your discharge medications  or the care you received while you were in the hospital after you are discharged, you can call the unit and ask to speak with the hospitalist on call if the hospitalist that took care of you is not available. Once you are discharged, your primary care physician will handle any further medical issues. Please note that NO REFILLS for any discharge medications will be authorized once you are discharged, as it is imperative that you return to your primary care physician (or establish a relationship with a primary care physician if you do not have one) for your aftercare needs so that they can reassess your need for medications and monitor your lab values.  Follow up with PCP and General Surgery. Take all  medications as prescribed. If symptoms change or worsen please return to the ED for evaluation   Increase activity slowly   Complete by:  As directed      Allergies as of 03/03/2018   No Known Allergies     Medication List    STOP taking these medications   HYDROcodone-acetaminophen 5-325 MG tablet Commonly known as:  NORCO/VICODIN     TAKE these medications   amitriptyline 25 MG tablet Commonly known as:  ELAVIL Take 0.5 tablets (12.5 mg total) by mouth at bedtime.   ciprofloxacin 500 MG tablet Commonly known as:  CIPRO Take 1 tablet (500 mg total) by mouth 2 (two) times daily for 10 days.   cyanocobalamin 1000 MCG tablet Take 1 tablet (1,000 mcg total) by mouth daily. Start taking on:  03/04/2018   ibuprofen 200 MG tablet Commonly known as:  ADVIL,MOTRIN Take 400 mg by mouth every 6 (six) hours as needed for headache or moderate pain.   magnesium oxide 400 (241.3 Mg) MG tablet Commonly known as:  MAG-OX Take 1 tablet (400 mg total) by mouth 2 (two) times daily.   metroNIDAZOLE 500 MG tablet Commonly known as:  FLAGYL Take 1 tablet (500 mg total) by mouth every 8 (eight) hours for 10 days.   norgestimate-ethinyl estradiol 0.25-35 MG-MCG tablet Commonly known as:  ORTHO-CYCLEN,SPRINTEC,PREVIFEM Take 1 tablet by mouth daily.   ondansetron 4 MG disintegrating tablet Commonly known as:  ZOFRAN-ODT Take 1 tablet (4 mg total) by mouth every 8 (eight) hours as needed for nausea or vomiting.   oxyCODONE-acetaminophen 5-325 MG tablet Commonly known as:  PERCOCET/ROXICET Take 1-2 tablets by mouth every 4 (four) hours as needed for moderate pain or severe pain.   venlafaxine XR 75 MG 24 hr capsule Commonly known as:  EFFEXOR-XR Take 1 capsule (75 mg total) by mouth daily with breakfast.      Follow-up Information    Martinique, Betty G, MD In 1 week.   Specialty:  Family Medicine Why:  For repeat exam and check-up Contact information: [place] [address] [phone]        Autumn Messing III, MD. Schedule an appointment as soon as possible for a visit in 4 week(s).   Specialty:  General Surgery Why:  to discuss elective colectomy Contact information: [place] [address] [phone]          No Known Allergies  Consultations:  General Surgery  Procedures/Studies: US Transvaginal Non-ob  Result Date: 02/28/2018 CLINICAL DATA:  Initial evaluation for acute left lower quadrant abdominal pain. Abnormal CT. EXAM: TRANSABDOMINAL AND TRANSVAGINAL ULTRASOUND OF PELVIS DOPPLER ULTRASOUND OF OVARIES TECHNIQUE: Both transabdominal and transvaginal ultrasound examinations of the pelvis were performed. Transabdominal technique was  performed for global imaging of the pelvis including uterus, ovaries, adnexal regions, and pelvic cul-de-sac. It was necessary to proceed with endovaginal exam following the transabdominal exam to visualize the uterus, endometrium, and ovaries. Color and duplex Doppler ultrasound was utilized to evaluate blood flow to the ovaries. COMPARISON:  Prior CT from 02/27/2018. FINDINGS: Uterus Measurements: 8.1 x 4.9 x 5.4 cm. Intramural fundal fibroid measuring 1.3 x 1.5 x 1.2 cm noted. Endometrium Thickness: 7.5 mm.  No focal abnormality visualized. Right ovary Measurements: 3.2 x 2.5 x 2.1 cm. Normal appearance/no adnexal mass. Left ovary Measurements: 3.6 x 2.6 x 2.4 cm. 1.1 x 1.0 x 1.2 cm predominantly simple cyst, favored to reflect a small physiologic follicular dominant follicle. Additional smaller adjacent cyst also felt to be most consistent with a small normal physiologic follicle. No other definite discrete collection or abscess identified. Pulsed Doppler evaluation of both ovaries demonstrates normal low-resistance arterial and venous waveforms. Other findings No abnormal free fluid. IMPRESSION: 1. Small left ovarian cysts measuring up to 1.2 cm, felt to be most consistent  with normal physiologic follicular cysts. 2. No other discrete collection or abscess associated with previously identified sigmoid diverticulitis identified. 3. No evidence for ovarian torsion. 4. 1.5 cm fundal fibroid. Electronically Signed   By: Jeannine Boga M.D.   On: 02/28/2018 02:13   US Pelvis Complete  Result Date: 02/28/2018 CLINICAL DATA:  Initial evaluation for acute left lower quadrant abdominal pain. Abnormal CT. EXAM: TRANSABDOMINAL AND TRANSVAGINAL ULTRASOUND OF PELVIS DOPPLER ULTRASOUND OF OVARIES TECHNIQUE: Both transabdominal and transvaginal ultrasound examinations of the pelvis were performed. Transabdominal technique was performed for global imaging of the pelvis including uterus, ovaries, adnexal regions, and pelvic cul-de-sac. It was necessary to proceed with endovaginal exam following the transabdominal exam to visualize the uterus, endometrium, and ovaries. Color and duplex Doppler ultrasound was utilized to evaluate blood flow to the ovaries. COMPARISON:  Prior CT from 02/27/2018. FINDINGS: Uterus Measurements: 8.1 x 4.9 x 5.4 cm. Intramural fundal fibroid measuring 1.3 x 1.5 x 1.2 cm noted. Endometrium Thickness: 7.5 mm.  No focal abnormality visualized. Right ovary Measurements: 3.2 x 2.5 x 2.1 cm. Normal appearance/no adnexal mass. Left ovary Measurements: 3.6 x 2.6 x 2.4 cm. 1.1 x 1.0 x 1.2 cm predominantly simple cyst, favored to reflect a small physiologic follicular dominant follicle. Additional smaller adjacent cyst also felt to be most consistent with a small normal physiologic follicle. No other definite discrete collection or abscess identified. Pulsed Doppler evaluation of both ovaries demonstrates normal low-resistance arterial and venous waveforms. Other findings No abnormal free fluid. IMPRESSION: 1. Small left ovarian cysts measuring up to 1.2 cm, felt to be most consistent with normal physiologic follicular cysts. 2. No other discrete collection or abscess  associated with previously identified sigmoid diverticulitis identified. 3. No evidence for ovarian torsion. 4. 1.5 cm fundal fibroid. Electronically Signed   By: Jeannine Boga M.D.   On: 02/28/2018 02:13   Ct Abdomen Pelvis W Contrast  Result Date: 02/27/2018 CLINICAL DATA:  37 year old female with nausea vomiting and abdominal pain. EXAM: CT ABDOMEN AND PELVIS WITH CONTRAST TECHNIQUE: Multidetector CT imaging of the abdomen and pelvis was performed using the standard protocol following bolus administration of intravenous contrast. CONTRAST:  170mL ISOVUE-300 IOPAMIDOL (ISOVUE-300) INJECTION 61% COMPARISON:  CT of the abdomen pelvis dated 09/28/2016 FINDINGS: Lower chest: The visualized lung bases are clear. No intra-abdominal free air.  Small free fluid within the pelvis. Hepatobiliary: The liver is unremarkable. No intrahepatic biliary ductal dilatation.  Cholecystectomy. The common bile duct measures 13 mm. No calcified stone noted in the central CBD. Pancreas: Unremarkable. No pancreatic ductal dilatation or surrounding inflammatory changes. Spleen: There is a 4.5 cm inferior splenic cyst. Adrenals/Urinary Tract: The adrenal glands are unremarkable. There is no hydronephrosis on either side. The visualized ureters and urinary bladder appear unremarkable. Stomach/Bowel: There is sigmoid diverticulosis without active inflammatory changes compatible with acute diverticulitis. There is loss of fat plane between the sigmoid colon and the left ovary which may represent a degree of adhesion. There is a somewhat tubular appearing low attenuating structure adjacent to the sigmoid colon and in the region of the left ovary. Although this may represent ovarian follicle or normal ovarian tissue, an abscess is not excluded. This measures approximately 3.2 x 2.7 cm. Vascular/Lymphatic: No significant vascular findings are present. No enlarged abdominal or pelvic lymph nodes. Reproductive: The uterus is anteverted  and grossly unremarkable. There is mildly enlargement of the left ovary with inflammatory changes secondary to inflammation of this sigmoid diverticula. Low attenuating structure in the region of the left ovary, may represent follicle or developing abscess. Other: Midline vertical anterior pelvic wall incisional scar. Musculoskeletal: No acute or significant osseous findings. IMPRESSION: Acute sigmoid diverticulitis. Fluid attenuating structure adjacent to the sigmoid colon in the region of the left ovary may represent ovarian follicle versus developing abscess. Clinical correlation is recommended. Electronically Signed   By: Anner Crete M.D.   On: 02/27/2018 23:01   [place] Art/ven Flow Abd Pelv Doppler  Result Date: 02/28/2018 CLINICAL DATA:  Initial evaluation for acute left lower quadrant abdominal pain. Abnormal CT. EXAM: TRANSABDOMINAL AND TRANSVAGINAL ULTRASOUND OF PELVIS DOPPLER ULTRASOUND OF OVARIES TECHNIQUE: Both transabdominal and transvaginal ultrasound examinations of the pelvis were performed. Transabdominal technique was performed for global imaging of the pelvis including uterus, ovaries, adnexal regions, and pelvic cul-de-sac. It was necessary to proceed with endovaginal exam following the transabdominal exam to visualize the uterus, endometrium, and ovaries. Color and duplex Doppler ultrasound was utilized to evaluate blood flow to the ovaries. COMPARISON:  Prior CT from 02/27/2018. FINDINGS: Uterus Measurements: 8.1 x 4.9 x 5.4 cm. Intramural fundal fibroid measuring 1.3 x 1.5 x 1.2 cm noted. Endometrium Thickness: 7.5 mm.  No focal abnormality visualized. Right ovary Measurements: 3.2 x 2.5 x 2.1 cm. Normal appearance/no adnexal mass. Left ovary Measurements: 3.6 x 2.6 x 2.4 cm. 1.1 x 1.0 x 1.2 cm predominantly simple cyst, favored to reflect a small physiologic follicular dominant follicle. Additional smaller adjacent cyst also felt to be most consistent with a small normal physiologic  follicle. No other definite discrete collection or abscess identified. Pulsed Doppler evaluation of both ovaries demonstrates normal low-resistance arterial and venous waveforms. Other findings No abnormal free fluid. IMPRESSION: 1. Small left ovarian cysts measuring up to 1.2 cm, felt to be most consistent with normal physiologic follicular cysts. 2. No other discrete collection or abscess associated with previously identified sigmoid diverticulitis identified. 3. No evidence for ovarian torsion. 4. 1.5 cm fundal fibroid. Electronically Signed   By: Jeannine Boga M.D.   On: 02/28/2018 02:13    Subjective: Seen and examined at bedside states that her headache was better and she had no abdominal pain, nausea, vomiting.  No chest pain, lightheadedness or dizziness.  No other concerns or complaints at this time is tolerating breakfast and ready to go home.  Discharge Exam: Vitals:   03/02/18 2038 03/03/18 0436  BP: 119/79 (!) 103/55  Pulse: 60 60  Resp: 18 18  Temp: 98.1 F (36.7 C) 98.4 F (36.9 C)  SpO2: 100% 97%   Vitals:   03/02/18 0446 03/02/18 1345 03/02/18 2038 03/03/18 0436  BP: 111/60 110/71 119/79 (!) 103/55  Pulse: 65 69 60 60  Resp: 18 18 18 18   Temp: 98.6 F (37 C) 98.4 F (36.9 C) 98.1 F (36.7 C) 98.4 F (36.9 C)  TempSrc: Oral Oral Oral Oral  SpO2: 97% 99% 100% 97%  Weight:      Height:       General: Pt is alert, awake, not in acute distress Cardiovascular: RRR, S1/S2 +, no rubs, no gallops Respiratory: Slightly diminished bilaterally, no wheezing, no rhonchi Abdominal: Soft, NT, distended secondary to body habitus, bowel sounds + Extremities: Trace nonpitting edema, no cyanosis  The results of significant diagnostics from this hospitalization (including imaging, microbiology, ancillary and laboratory) are listed below for reference.    Microbiology: Recent Results (from the past 240 hour(s))  Urine culture     Status: Abnormal   Collection Time:  02/27/18  9:41 PM  Result Value Ref Range Status   Specimen Description   Final    URINE, CLEAN CATCH Performed at [facility], [place] [address] [place], [place] [address]    Special Requests   Final    Normal Performed at [facility], [place] [address], [place], [place] [address]    Culture >=100,000 COLONIES/mL ESCHERICHIA COLI (A)  Final   Report Status 03/02/2018 FINAL  Final   Organism ID, Bacteria ESCHERICHIA COLI (A)  Final      Susceptibility   Escherichia coli - MIC*    AMPICILLIN <=2 SENSITIVE Sensitive     CEFAZOLIN <=4 SENSITIVE Sensitive     CEFTRIAXONE <=1 SENSITIVE Sensitive     CIPROFLOXACIN <=0.25 SENSITIVE Sensitive     GENTAMICIN <=1 SENSITIVE Sensitive     IMIPENEM <=0.25 SENSITIVE Sensitive     NITROFURANTOIN <=16 SENSITIVE Sensitive     TRIMETH/SULFA <=20 SENSITIVE Sensitive     AMPICILLIN/SULBACTAM <=2 SENSITIVE Sensitive     PIP/TAZO <=4 SENSITIVE Sensitive     Extended ESBL NEGATIVE Sensitive     * >=100,000 COLONIES/mL ESCHERICHIA COLI  Wet prep, genital     Status: Abnormal   Collection Time: 02/27/18 11:35 PM  Result Value Ref Range Status   Yeast Wet Prep HPF POC NONE SEEN NONE SEEN Final   Trich, Wet Prep NONE SEEN NONE SEEN Final   Clue Cells Wet Prep HPF POC PRESENT (A) NONE SEEN Final   WBC, Wet Prep HPF POC FEW (A) NONE SEEN Final   Sperm NONE SEEN  Final    Comment: Performed at [facility], [place] [address], [place], [place] [address]    Labs: BNP (last 3 results) No results for input(s): BNP in the last 8760 hours. Basic Metabolic Panel: Recent Labs  Lab 02/27/18 1918 02/28/18 0607 03/01/18 0535 03/02/18 0556 03/03/18 0552  NA 143 140 139 140 140  K 3.4* 3.1* 3.6 3.9 3.6  CL 108 109 109 109 107  CO2 26 24 25 25 26   GLUCOSE 95 93 112* 108* 99  BUN 8 7 6  <5* <5*  CREATININE 0.69 0.61 0.48 0.58 0.61  CALCIUM 8.8* 7.7* 7.5* 8.2* 8.5*  MG  --   --  1.9 1.8 1.7  PHOS  --   --   1.6* 2.1* 3.3   Liver Function Tests: Recent Labs  Lab 02/27/18 1925 02/28/18 0607 03/01/18 0535 03/02/18 0556 03/03/18  0552  AST 14* 12* 11* 15 25  ALT 16 14 13 14 17   ALKPHOS 37* 34* 31* 33* 32*  BILITOT 0.6 0.8 0.4 0.4 0.3  PROT 7.2 6.3* 5.9* 6.2* 6.4*  ALBUMIN 3.1* 2.7* 2.5* 2.6* 2.7*   Recent Labs  Lab 02/27/18 1925  LIPASE 30   No results for input(s): AMMONIA in the last 168 hours. CBC: Recent Labs  Lab 02/27/18 1918 02/28/18 0607 03/01/18 0535 03/02/18 0556 03/03/18 0552  WBC 9.8 6.6 4.3 3.5* 4.0  NEUTROABS  --   --  2.6 1.9 2.0  HGB 12.5 11.2* 10.1* 10.5* 10.8*  HCT 37.1 33.7* 31.2* 32.1* 32.9*  MCV 90.3 90.6 91.8 90.9 90.9  PLT 286 223 221 234 261   Cardiac Enzymes: No results for input(s): CKTOTAL, CKMB, CKMBINDEX, TROPONINI in the last 168 hours. BNP: Invalid input(s): POCBNP CBG: Recent Labs  Lab 02/27/18 1837 02/28/18 1616  GLUCAP 85 98   D-Dimer No results for input(s): DDIMER in the last 72 hours. Hgb A1c No results for input(s): HGBA1C in the last 72 hours. Lipid Profile No results for input(s): CHOL, HDL, LDLCALC, TRIG, CHOLHDL, LDLDIRECT in the last 72 hours. Thyroid function studies No results for input(s): TSH, T4TOTAL, T3FREE, THYROIDAB in the last 72 hours.  Invalid input(s): FREET3 Anemia work up Recent Labs    03/02/18 0556  VITAMINB12 176*  FOLATE 9.7  FERRITIN 43  TIBC 340  IRON 31  RETICCTPCT 1.6   Urinalysis    Component Value Date/Time   COLORURINE AMBER (A) 02/27/2018 2141   APPEARANCEUR HAZY (A) 02/27/2018 2141   LABSPEC 1.020 02/27/2018 2141   PHURINE 5.0 02/27/2018 2141   GLUCOSEU NEGATIVE 02/27/2018 2141   HGBUR LARGE (A) 02/27/2018 2141   HGBUR trace-lysed 08/08/2007 1411   BILIRUBINUR NEGATIVE 02/27/2018 2141   KETONESUR 5 (A) 02/27/2018 2141   PROTEINUR NEGATIVE 02/27/2018 2141   UROBILINOGEN 1.0 12/20/2012 1905   NITRITE POSITIVE (A) 02/27/2018 2141   LEUKOCYTESUR SMALL (A) 02/27/2018 2141    Sepsis Labs Invalid input(s): PROCALCITONIN,  WBC,  LACTICIDVEN Microbiology Recent Results (from the past 240 hour(s))  Urine culture     Status: Abnormal   Collection Time: 02/27/18  9:41 PM  Result Value Ref Range Status   Specimen Description   Final    URINE, CLEAN CATCH Performed at [facility], [place] [address] [place], [place] [address]    Special Requests   Final    Normal Performed at [facility], [place] [address], [place], [place] [address]    Culture >=100,000 COLONIES/mL ESCHERICHIA COLI (A)  Final   Report Status 03/02/2018 FINAL  Final   Organism ID, Bacteria ESCHERICHIA COLI (A)  Final      Susceptibility   Escherichia coli - MIC*    AMPICILLIN <=2 SENSITIVE Sensitive     CEFAZOLIN <=4 SENSITIVE Sensitive     CEFTRIAXONE <=1 SENSITIVE Sensitive     CIPROFLOXACIN <=0.25 SENSITIVE Sensitive     GENTAMICIN <=1 SENSITIVE Sensitive     IMIPENEM <=0.25 SENSITIVE Sensitive     NITROFURANTOIN <=16 SENSITIVE Sensitive     TRIMETH/SULFA <=20 SENSITIVE Sensitive     AMPICILLIN/SULBACTAM <=2 SENSITIVE Sensitive     PIP/TAZO <=4 SENSITIVE Sensitive     Extended ESBL NEGATIVE Sensitive     * >=100,000 COLONIES/mL ESCHERICHIA COLI  Wet prep, genital     Status: Abnormal   Collection Time: 02/27/18 11:35 PM  Result Value Ref Range Status  Yeast Wet Prep HPF POC NONE SEEN NONE SEEN Final   Trich, Wet Prep NONE SEEN NONE SEEN Final   Clue Cells Wet Prep HPF POC PRESENT (A) NONE SEEN Final   WBC, Wet Prep HPF POC FEW (A) NONE SEEN Final   Sperm NONE SEEN  Final    Comment: Performed at [facility], [place] [address] [place], [place] [address]   Time coordinating discharge: 35 minutes  SIGNED:  Kerney Elbe, DO [facility] 03/03/2018, 1:17 PM Pager is on [place]  If 7PM-7AM, please contact night-coverage [email or web address] Password [email or web address]

## 2018-03-04 ENCOUNTER — Telehealth: Payer: Self-pay | Admitting: Family Medicine

## 2018-03-04 NOTE — Telephone Encounter (Signed)
Unable to reach patient at time of TCM Call. Left message for patient to return call when available.  

## 2018-03-04 NOTE — Telephone Encounter (Signed)
Transition Care Management Follow-up Telephone Call   Date discharged?  03/03/18   How have you been since you were released from the hospital? "Taking it easy, doing well though"   Do you understand why you were in the hospital? yes   Do you understand the discharge instructions? yes  Where were you discharged to? Home  Items Reviewed:  Medications reviewed: yes  Allergies reviewed: yes  Dietary changes reviewed: yes  Referrals reviewed: no   Functional Questionnaire:   Activities of Daily Living (ADLs):   She states they are independent in the following: ambulation, bathing and hygiene, feeding, continence, grooming, toileting and dressing States they require assistance with the following: n/a   Any transportation issues/concerns?: no   Any patient concerns? Surgeon gave her diet recommendations - "there are a few contradictions in there so its a little confusing as to what I can and cannot eat"   Confirmed importance and date/time of follow-up visits scheduled yes  Provider Appointment booked with Dr Martinique on 10/4 at 9:30 am.  Confirmed with patient if condition begins to worsen call PCP or go to the ER.  Patient was given the office number and encouraged to call back with question or concerns.  : yes

## 2018-03-06 NOTE — Progress Notes (Signed)
HPI:   [name] [name] is a 37 y.o. female, who is here today to follow on recent hospitalization.    Admitted on 02/27/18 due to LLQ abdominal pain, Dx with acute sigmoid diverticulitis with concern of developing abscess.  Treated with IV abx, Meropenen, changed to oral Flagyl and Cipro. She was discharged on 03/03/18  CT abdomen and pelvis with contrast on 02/27/2018:  Acute sigmoid diverticulitis. Fluid attenuating structure adjacent to the sigmoid colon in the region of the left ovary may represent ovarian follicle versus developing abscess. Clinical correlation is Recommended.   Follow up appt with Dr Marlou Starks already scheduled.  US pelvic/transvaginal ultrasound and Doppler done on 02/28/2018.  1. Small left ovarian cysts measuring up to 1.2 cm, felt to be most consistent with normal physiologic follicular cysts. 2. No other discrete collection or abscess associated with previously identified sigmoid diverticulitis identified. 3. No evidence for ovarian torsion. 4. 1.5 cm fundal fibroid. 1. Small left ovarian cysts measuring up to 1.2 cm, felt to be most consistent with normal physiologic follicular cysts. 2. No other discrete collection or abscess associated with previously identified sigmoid diverticulitis identified. 3. No evidence for ovarian torsion. 4. 1.5 cm fundal fibroid.  Mild anemia, no gross hematuria or blood in stool. Last bowel movement last night.  She is not on Iron supplementation.   Lab Results  Component Value Date   WBC 4.0 03/03/2018   HGB 10.8 (L) 03/03/2018   HCT 32.9 (L) 03/03/2018   MCV 90.9 03/03/2018   PLT 261 03/03/2018    Lab Results  Component Value Date   ALT 17 03/03/2018   AST 25 03/03/2018   ALKPHOS 32 (L) 03/03/2018   BILITOT 0.3 03/03/2018   Lab Results  Component Value Date   CREATININE 0.61 03/03/2018   BUN <5 (L) 03/03/2018   NA 140 03/03/2018   K 3.6 03/03/2018   CL 107 03/03/2018   CO2 26 03/03/2018   She  is not longer having abdominal pain, she feels "fine." Pain improved while she was still in the ER.  She is on liquid diet, she would like to go back to her regular diet. Hypocalcemia, she is also concerned about low Ca++ mildly low at 8.5 upon discharge, 7.5 upon admission.   Anxiety and depression,worse since hospitalization. She is afraid of ending up with "colostomy bag." According to pt,she was told this is what she will be discussing with surgeon. She is currently on Effexor XR 75 mg daily. Amitriptyline is helping her with sleep and headaches.  + Crying spells. Denies suicidal thoughts.   Review of Systems  Constitutional: Positive for fatigue. Negative for activity change, appetite change and fever.  HENT: Negative for mouth sores, nosebleeds and trouble swallowing.   Respiratory: Negative for cough, shortness of breath and wheezing.   Cardiovascular: Negative for chest pain, palpitations and leg swelling.  Gastrointestinal: Negative for abdominal pain, nausea and vomiting.       Negative for changes in bowel habits.  Genitourinary: Negative for decreased urine volume, dysuria and hematuria.  Neurological: Negative for syncope, weakness and headaches.  Psychiatric/Behavioral: Positive for sleep disturbance. Negative for confusion and suicidal ideas. The patient is nervous/anxious.       Current Outpatient Medications on File Prior to Visit  Medication Sig Dispense Refill  . amitriptyline (ELAVIL) 25 MG tablet Take 0.5 tablets (12.5 mg total) by mouth at bedtime. 45 tablet 1  . ciprofloxacin (CIPRO) 500 MG tablet Take 1 tablet (500  mg total) by mouth 2 (two) times daily for 10 days. 20 tablet 0  . ibuprofen (ADVIL,MOTRIN) 200 MG tablet Take 400 mg by mouth every 6 (six) hours as needed for headache or moderate pain.    . magnesium oxide (MAG-OX) 400 (241.3 Mg) MG tablet Take 1 tablet (400 mg total) by mouth 2 (two) times daily. 4 tablet 0  . metroNIDAZOLE (FLAGYL) 500 MG  tablet Take 1 tablet (500 mg total) by mouth every 8 (eight) hours for 10 days. 30 tablet 0  . norgestimate-ethinyl estradiol (ORTHO-CYCLEN, 28,) 0.25-35 MG-MCG tablet Take 1 tablet by mouth daily. 3 Package 3  . ondansetron (ZOFRAN ODT) 4 MG disintegrating tablet Take 1 tablet (4 mg total) by mouth every 8 (eight) hours as needed for nausea or vomiting. 20 tablet 0  . oxyCODONE-acetaminophen (PERCOCET/ROXICET) 5-325 MG tablet Take 1-2 tablets by mouth every 4 (four) hours as needed for moderate pain or severe pain. 20 tablet 0  . venlafaxine XR (EFFEXOR XR) 75 MG 24 hr capsule Take 1 capsule (75 mg total) by mouth daily with breakfast. 30 capsule 2  . vitamin B-12 1000 MCG tablet Take 1 tablet (1,000 mcg total) by mouth daily. 30 tablet 0   No current facility-administered medications on file prior to visit.      Past Medical History:  Diagnosis Date  . Anxiety   . Asthma    childhood exercise induced only  . 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 Cholecystitis chronic, acute   . Depression   . Gallstones   . Headache(784.0)    migraines  . Heart murmur    no indications for 7-86yrs per pt.   No Known Allergies  Social History   Socioeconomic History  . Marital status: Single    Spouse name: Not on file  . Number of children: 3  . Years of education: Not on file  . Highest education level: Not on file  Occupational History    Comment: [employer]-part time  Social Needs  . Financial resource strain: Not on file  . Food insecurity:    Worry: Not on file    Inability: Not on file  . Transportation needs:    Medical: Not on file    Non-medical: Not on file  Tobacco Use  . Smoking status: Never Smoker  . Smokeless tobacco: Never Used  Substance and Sexual Activity  . Alcohol use: No  . Drug use: No  . Sexual activity: Yes    Partners: Male    Birth control/protection: Pill  Lifestyle  . Physical activity:    Days per week: Not on file    Minutes per session: Not on file  .  Stress: Not on file  Relationships  . Social connections:    Talks on phone: Not on file    Gets together: Not on file    Attends religious service: Not on file    Active member of club or organization: Not on file    Attends meetings of clubs or organizations: Not on file    Relationship status: Not on file  Other Topics Concern  . Not on file  Social History Narrative   Single, lives with significant other [name] [name])   #3 children: ages 13-11-2 1/2 months   Employer: [name]   Independent ADLs    Vitals:   03/07/18 0931  BP: 124/80  Pulse: 78  Resp: 12  Temp: 98.4 F (36.9 C)  SpO2: 98%   Body mass  index is 48.28 kg/m.    Physical Exam  Nursing note and vitals reviewed. Constitutional: She is oriented to person, place, and time. She appears well-developed. No distress.  HENT:  Head: Normocephalic and atraumatic.  Mouth/Throat: Oropharynx is clear and moist and mucous membranes are normal.  Eyes: Pupils are equal, round, and reactive to light. Conjunctivae are normal.  Cardiovascular: Normal rate and regular rhythm.  No murmur heard. Pulses:      Dorsalis pedis pulses are 2+ on the right side, and 2+ on the left side.  Respiratory: Effort normal and breath sounds normal. No respiratory distress.  GI: Soft. She exhibits no mass. There is no hepatomegaly. There is no tenderness.  Musculoskeletal: She exhibits no edema.  Lymphadenopathy:    She has no cervical adenopathy.  Neurological: She is alert and oriented to person, place, and time. She has normal strength. No cranial nerve deficit. Gait normal.  Skin: Skin is warm. No rash noted. No erythema.  Psychiatric: Her mood appears anxious. Her affect is labile.  Well groomed, good eye contact.    ASSESSMENT AND PLAN:  [name] [name] was seen today for hospitalization follow-up.   Orders Placed This Encounter  Procedures  . Flu Vaccine QUAD 36+ mos IM  . CBC with Differential/Platelet  . Comprehensive  metabolic panel  . VITAMIN D 25 Hydroxy (Vit-D Deficiency, Fractures)  . Parathyroid hormone, intact (no Ca)   Lab Results  Component Value Date   WBC 5.6 03/07/2018   HGB 13.5 03/07/2018   HCT 40.4 03/07/2018   MCV 89.3 03/07/2018   PLT 359.0 03/07/2018   Lab Results  Component Value Date   ALT 27 03/07/2018   AST 23 03/07/2018   ALKPHOS 44 03/07/2018   BILITOT 0.5 03/07/2018   Lab Results  Component Value Date   CREATININE 0.68 03/07/2018   BUN 12 03/07/2018   NA 140 03/07/2018   K 4.7 03/07/2018   CL 103 03/07/2018   CO2 28 03/07/2018    Sigmoid diverticulitis  Pain has resolved. Complete abx treatment. Instructed about warning signs. Keep appt with surgeon.  -     CBC with Differential/Platelet -     Comprehensive metabolic panel  Moderate major depression ([place])  Exacerbated by recent illness. For now no changes in Effexor. Instructed about warning sings. F/U in 4 weeks.  Normocytic anemia  Further recommendations will be given according to lab results.  Hypocalcemia  Low albumin as well. Corrected Ca++ in normal range. Further recommendations will be given according to lab results.  -     VITAMIN D 25 Hydroxy (Vit-D Deficiency, Fractures) -     Parathyroid hormone, intact (no Ca)  Anxiety disorder, unspecified type  After discussion of some side effects she agrees with trying Clonazepam daily prn. No changes in Effexor for now. F/U in 4 weeks,before if needed.  -     clonazePAM (KLONOPIN) 0.5 MG tablet; Take 1 tablet (0.5 mg total) by mouth daily as needed for anxiety.  Need for immunization against influenza -     Flu Vaccine QUAD 36+ mos IM      Huyen Perazzo G. Martinique, MD  [facility]. [place] office.

## 2018-03-07 ENCOUNTER — Encounter: Payer: Self-pay | Admitting: Family Medicine

## 2018-03-07 ENCOUNTER — Ambulatory Visit: Payer: Medicaid Other | Admitting: Family Medicine

## 2018-03-07 VITALS — BP 124/80 | HR 78 | Temp 98.4°F | Resp 12 | Ht 64.0 in | Wt 281.2 lb

## 2018-03-07 DIAGNOSIS — K5732 Diverticulitis of large intestine without perforation or abscess without bleeding: Secondary | ICD-10-CM

## 2018-03-07 DIAGNOSIS — F321 Major depressive disorder, single episode, moderate: Secondary | ICD-10-CM | POA: Diagnosis not present

## 2018-03-07 DIAGNOSIS — Z23 Encounter for immunization: Secondary | ICD-10-CM | POA: Diagnosis not present

## 2018-03-07 DIAGNOSIS — D649 Anemia, unspecified: Secondary | ICD-10-CM | POA: Diagnosis not present

## 2018-03-07 DIAGNOSIS — F419 Anxiety disorder, unspecified: Secondary | ICD-10-CM | POA: Diagnosis not present

## 2018-03-07 LAB — CBC WITH DIFFERENTIAL/PLATELET
BASOS PCT: 0.6 % (ref 0.0–3.0)
Basophils Absolute: 0 10*3/uL (ref 0.0–0.1)
EOS PCT: 0.9 % (ref 0.0–5.0)
Eosinophils Absolute: 0.1 10*3/uL (ref 0.0–0.7)
HCT: 40.4 % (ref 36.0–46.0)
HEMOGLOBIN: 13.5 g/dL (ref 12.0–15.0)
Lymphocytes Relative: 34.9 % (ref 12.0–46.0)
Lymphs Abs: 2 10*3/uL (ref 0.7–4.0)
MCHC: 33.5 g/dL (ref 30.0–36.0)
MCV: 89.3 fl (ref 78.0–100.0)
MONOS PCT: 5.3 % (ref 3.0–12.0)
Monocytes Absolute: 0.3 10*3/uL (ref 0.1–1.0)
Neutro Abs: 3.3 10*3/uL (ref 1.4–7.7)
Neutrophils Relative %: 58.3 % (ref 43.0–77.0)
Platelets: 359 10*3/uL (ref 150.0–400.0)
RBC: 4.53 Mil/uL (ref 3.87–5.11)
RDW: 13.7 % (ref 11.5–15.5)
WBC: 5.6 10*3/uL (ref 4.0–10.5)

## 2018-03-07 LAB — COMPREHENSIVE METABOLIC PANEL
ALBUMIN: 4.1 g/dL (ref 3.5–5.2)
ALT: 27 U/L (ref 0–35)
AST: 23 U/L (ref 0–37)
Alkaline Phosphatase: 44 U/L (ref 39–117)
BILIRUBIN TOTAL: 0.5 mg/dL (ref 0.2–1.2)
BUN: 12 mg/dL (ref 6–23)
CALCIUM: 9.6 mg/dL (ref 8.4–10.5)
CO2: 28 mEq/L (ref 19–32)
Chloride: 103 mEq/L (ref 96–112)
Creatinine, Ser: 0.68 mg/dL (ref 0.40–1.20)
GFR: 103.38 mL/min (ref >60.00)
Glucose, Bld: 89 mg/dL (ref 70–99)
Potassium: 4.7 mEq/L (ref 3.5–5.1)
Sodium: 140 mEq/L (ref 135–145)
Total Protein: 7.6 g/dL (ref 6.0–8.3)

## 2018-03-07 LAB — VITAMIN D 25 HYDROXY (VIT D DEFICIENCY, FRACTURES): VITD: 13.55 ng/mL — AB (ref 30.00–100.00)

## 2018-03-07 MED ORDER — CLONAZEPAM 0.5 MG PO TABS: 0.5000 mg | ORAL_TABLET | Freq: Every day | ORAL | 0 refills | Status: DC | PRN

## 2018-03-07 NOTE — Patient Instructions (Signed)
A few things to remember from today's visit:   Sigmoid diverticulitis - Plan: CBC with Differential/Platelet, Comprehensive metabolic panel  Moderate major depression (HCC)  Normocytic anemia  Hypocalcemia - Plan: VITAMIN D 25 Hydroxy (Vit-D Deficiency, Fractures), Parathyroid hormone, intact (no Ca)  Anxiety disorder, unspecified type - Plan: clonazePAM (KLONOPIN) 0.5 MG tablet  Continue current antibiotic treatment. Today we are adding clonazepam 0.5 mg to take daily as needed. No changes in the regular medications. This more your appointment in 2 weeks for 4 weeks f/u.   Please be sure medication list is accurate. If a new problem present, please set up appointment sooner than planned today.

## 2018-03-09 ENCOUNTER — Encounter: Payer: Self-pay | Admitting: Family Medicine

## 2018-03-10 LAB — PARATHYROID HORMONE, INTACT (NO CA): PTH: 90 pg/mL — ABNORMAL HIGH (ref 14–64)

## 2018-03-24 ENCOUNTER — Ambulatory Visit: Payer: Medicaid Other | Admitting: Family Medicine

## 2018-04-07 ENCOUNTER — Encounter: Payer: Self-pay | Admitting: Family Medicine

## 2018-04-07 ENCOUNTER — Ambulatory Visit (INDEPENDENT_AMBULATORY_CARE_PROVIDER_SITE_OTHER): Payer: Medicaid Other | Admitting: Family Medicine

## 2018-04-07 VITALS — BP 120/78 | HR 93 | Temp 98.7°F | Resp 12 | Ht 64.0 in | Wt 285.0 lb

## 2018-04-07 DIAGNOSIS — Z6841 Body Mass Index (BMI) 40.0 and over, adult: Secondary | ICD-10-CM | POA: Diagnosis not present

## 2018-04-07 DIAGNOSIS — E66813 Obesity, class 3: Secondary | ICD-10-CM

## 2018-04-07 DIAGNOSIS — G43709 Chronic migraine without aura, not intractable, without status migrainosus: Secondary | ICD-10-CM | POA: Diagnosis not present

## 2018-04-07 DIAGNOSIS — F321 Major depressive disorder, single episode, moderate: Secondary | ICD-10-CM

## 2018-04-07 DIAGNOSIS — G4700 Insomnia, unspecified: Secondary | ICD-10-CM

## 2018-04-07 DIAGNOSIS — E559 Vitamin D deficiency, unspecified: Secondary | ICD-10-CM

## 2018-04-07 MED ORDER — TOPIRAMATE 50 MG PO TABS: 50.0000 mg | ORAL_TABLET | Freq: Every day | ORAL | 2 refills | Status: DC

## 2018-04-07 NOTE — Assessment & Plan Note (Signed)
Gain about 4 pounds since her last visit. We discussed benefits of wt loss as well as adverse effects of obesity. Consistency with healthy diet and physical activity recommended. Topamax 50 mg daily may help with weight loss.

## 2018-04-07 NOTE — Patient Instructions (Addendum)
A few things to remember from today's visit:   Insomnia, unspecified type  Chronic migraine without aura without status migrainosus, not intractable - Plan: topiramate (TOPAMAX) 50 MG tablet  Moderate major depression (HCC)  Vitamin D deficiency, unspecified  Start weaning off amitriptyline, take it every other day for 7 to 10 days and then stop. We are starting Topamax 50 mg at bedtime. No changes in result of medications. Vitamin D over-the-counter 5000 units daily.  Please be sure medication list is accurate. If a new problem present, please set up appointment sooner than planned today.

## 2018-04-07 NOTE — Assessment & Plan Note (Signed)
In general problem is well controlled. No changes in Effexor. Follow-up in 3 to 4 months, before if needed.

## 2018-04-07 NOTE — Assessment & Plan Note (Signed)
Because today we have started Topamax, recommend weaning off amitriptyline. Good sleep hygiene.

## 2018-04-07 NOTE — Progress Notes (Signed)
HPI:   [name] is a 37 y.o. female, who is here today for 4 months follow up.   She was last seen on 03/07/18 for hospitalization follow up. She did not keep appt with surgeon. Abdominal pain greatly improved,occasionally she has mild achy pain in LUQ, she does not even need to take something for pain.  Denies nausea, vomiting, changes in bowel habits, blood in stool or melena.   Last OV Clonazepam 0.Anxiety and depression:5 mg was recommended as needed daily because anxiety was getting worse. She has not taken med, has not needed so far.She filled Rx and has it at home,feels better if she has it available in case.  She is also on Effexor XR 75 mg daily.  She still has some crying spells but less frequent. She can deal better with stress. Chest pressure she was reporting last visit has resolved.  She denies suicidal thoughts.   Insomnia: She is on Amitriptyline 25 mg,takes 1/2 tab from Monday to Thursday and whole tab Friday and Saturday,she doe snot have to get up early next day. In general she has tolerated amitriptyline well.   Migraine: She feels like headaches are getting worse for the past 3 weeks.  Global headache, stabbing pain, severe.  She took Percocet she had left from a old prescription. Sometimes headache is just palliative parietal and occipital.  States that she has taking Percocet as needed in the past, she has tolerated it well and it is the "only" medication that helps.  Yesterday she had left frontal headache, associated with photophobia, she did not have nausea or vomiting.  She denies numbness, tingling, focal deficit, or mental status changes.  Vitamin D deficiency: She has not started vitamin D supplementation. According to patient, she has not received lab results. Last 25 OH vitamin D on 03/07/2017 was low at 13. It was recommended starting OTC Vit D3 5000 U daily.   Review of Systems  Constitutional: Positive for fatigue.  Negative for activity change, appetite change and fever.  HENT: Negative for mouth sores, nosebleeds and trouble swallowing.   Eyes: Positive for photophobia. Negative for redness and visual disturbance.  Respiratory: Negative for cough, shortness of breath and wheezing.   Cardiovascular: Negative for chest pain, palpitations and leg swelling.  Gastrointestinal: Negative for abdominal pain, nausea and vomiting.       Negative for changes in bowel habits.  Genitourinary: Negative for decreased urine volume, dysuria and hematuria.  Neurological: Positive for headaches. Negative for seizures, syncope, weakness and numbness.  Psychiatric/Behavioral: Positive for sleep disturbance. Negative for hallucinations and suicidal ideas. The patient is nervous/anxious.      Current Outpatient Medications on File Prior to Visit  Medication Sig Dispense Refill  . amitriptyline (ELAVIL) 25 MG tablet Take 0.5 tablets (12.5 mg total) by mouth at bedtime. 45 tablet 1  . clonazePAM (KLONOPIN) 0.5 MG tablet Take 1 tablet (0.5 mg total) by mouth daily as needed for anxiety. 20 tablet 0  . ibuprofen (ADVIL,MOTRIN) 200 MG tablet Take 400 mg by mouth every 6 (six) hours as needed for headache or moderate pain.    . norgestimate-ethinyl estradiol (ORTHO-CYCLEN, 28,) 0.25-35 MG-MCG tablet Take 1 tablet by mouth daily. 3 Package 3  . ondansetron (ZOFRAN ODT) 4 MG disintegrating tablet Take 1 tablet (4 mg total) by mouth every 8 (eight) hours as needed for nausea or vomiting. 20 tablet 0  . oxyCODONE-acetaminophen (PERCOCET/ROXICET) 5-325 MG tablet Take 1-2 tablets by mouth every 4 (  four) hours as needed for moderate pain or severe pain. 20 tablet 0  . venlafaxine XR (EFFEXOR XR) 75 MG 24 hr capsule Take 1 capsule (75 mg total) by mouth daily with breakfast. 30 capsule 2  . magnesium oxide (MAG-OX) 400 (241.3 Mg) MG tablet Take 1 tablet (400 mg total) by mouth 2 (two) times daily. (Patient not taking: Reported on 04/07/2018)  4 tablet 0  . vitamin B-12 1000 MCG tablet Take 1 tablet (1,000 mcg total) by mouth daily. (Patient not taking: Reported on 04/07/2018) 30 tablet 0   No current facility-administered medications on file prior to visit.      Past Medical History:  Diagnosis Date  . Anxiety   . Asthma    childhood exercise induced only  . 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 Cholecystitis chronic, acute   . Depression   . Gallstones   . Headache(784.0)    migraines  . Heart murmur    no indications for 7-41yrs per pt.   No Known Allergies  Social History   Socioeconomic History  . Marital status: Single    Spouse name: Not on file  . Number of children: 3  . Years of education: Not on file  . Highest education level: Not on file  Occupational History    Comment: [employer]-part time  Social Needs  . Financial resource strain: Not on file  . Food insecurity:    Worry: Not on file    Inability: Not on file  . Transportation needs:    Medical: Not on file    Non-medical: Not on file  Tobacco Use  . Smoking status: Never Smoker  . Smokeless tobacco: Never Used  Substance and Sexual Activity  . Alcohol use: No  . Drug use: No  . Sexual activity: Yes    Partners: Male    Birth control/protection: Pill  Lifestyle  . Physical activity:    Days per week: Not on file    Minutes per session: Not on file  . Stress: Not on file  Relationships  . Social connections:    Talks on phone: Not on file    Gets together: Not on file    Attends religious service: Not on file    Active member of club or organization: Not on file    Attends meetings of clubs or organizations: Not on file    Relationship status: Not on file  Other Topics Concern  . Not on file  Social History Narrative   Single, lives with significant other [name])   #3 children: ages 13-11-2 1/2 months   Employer: [name]   Independent ADLs    Vitals:   04/07/18 0945  BP: 120/78  Pulse: 93  Resp: 12  Temp: 98.7 F (37.1 C)    SpO2: 98%   Body mass index is 48.92 kg/m. Wt Readings from Last 3 Encounters:  04/07/18 285 lb (129.3 kg)  03/07/18 281 lb 4 oz (127.6 kg)  02/27/18 284 lb 12.8 oz (129.2 kg)     Physical Exam  Nursing note and vitals reviewed. Constitutional: She is oriented to person, place, and time. She appears well-developed. No distress.  HENT:  Head: Normocephalic and atraumatic.  Mouth/Throat: Oropharynx is clear and moist and mucous membranes are normal.  Eyes: Pupils are equal, round, and reactive to light. Conjunctivae are normal.  Cardiovascular: Normal rate and regular rhythm.  No murmur heard. Pulses:      Dorsalis pedis pulses are 2+ on  the right side, and 2+ on the left side.  Respiratory: Effort normal and breath sounds normal. No respiratory distress.  GI: Soft. She exhibits no mass. There is no hepatomegaly. There is no tenderness.  Musculoskeletal: She exhibits no edema.  Lymphadenopathy:    She has no cervical adenopathy.  Neurological: She is alert and oriented to person, place, and time. She has normal strength. No cranial nerve deficit. Gait normal.  Skin: Skin is warm. No rash noted. No erythema.  Psychiatric: Her mood appears anxious.  Well groomed, good eye contact.       ASSESSMENT AND PLAN:   [name] was seen today for 4 months follow-up.  No orders of the defined types were placed in this encounter.  Chronic migraine without aura without status migrainosus, not intractable Problem is getting worse. She seems to have a combination of tension-like headache and migraine headaches. She agrees with the weaning off amitriptyline and starting trazodone, we discussed some side effects. Instructed about warning signs. We will consider referral to neurologist if trazodone does not help. I do not recommend treating headaches with opioid medications, she can try OTC Excedrin Migraine. Follow-up in 3 months with a headache diary.  Class 3 severe  obesity with body mass index (BMI) of 45.0 to 49.9 in adult (HCC) Gain about 4 pounds since her last visit. We discussed benefits of wt loss as well as adverse effects of obesity. Consistency with healthy diet and physical activity recommended. Topamax 50 mg daily may help with weight loss.   Insomnia, unspecified Because today we have started Topamax, recommend weaning off amitriptyline. Good sleep hygiene.  Moderate major depression ([place]) In general problem is well controlled. No changes in Effexor. Follow-up in 3 to 4 months, before if needed.  Vitamin D deficiency, unspecified Recommend starting Vit D 5000 U daily. We will check Vit D next visit.      [name] [name] G. Martinique, MD  [facility]. [place] office.

## 2018-04-10 ENCOUNTER — Encounter: Payer: Self-pay | Admitting: Family Medicine

## 2018-04-23 ENCOUNTER — Encounter: Payer: Self-pay | Admitting: Family Medicine

## 2018-05-03 ENCOUNTER — Other Ambulatory Visit: Payer: Self-pay | Admitting: Family Medicine

## 2018-05-03 DIAGNOSIS — Z30011 Encounter for initial prescription of contraceptive pills: Secondary | ICD-10-CM

## 2018-06-12 ENCOUNTER — Encounter: Payer: Self-pay | Admitting: Family Medicine

## 2018-06-18 ENCOUNTER — Other Ambulatory Visit: Payer: Self-pay | Admitting: Family Medicine

## 2018-06-18 DIAGNOSIS — F321 Major depressive disorder, single episode, moderate: Secondary | ICD-10-CM

## 2018-06-24 ENCOUNTER — Telehealth: Payer: Self-pay

## 2018-06-24 NOTE — Telephone Encounter (Signed)
Copied from [place] [id number]. Topic: General - Other >> Jun 24, 2018  1:20 PM Yvette Rack wrote: Reason for CRM: pt calling back stating that her referral was supposed to go to the [facility] if any questions please call pt at [phone]  please call pt when referral has been sent to correct place

## 2018-06-25 NOTE — Telephone Encounter (Signed)
Patient called by referral coordinator concerning referral. Neoma Laming faxed referral to: [facility] Phone: [phone]  fax [phone] No further assistance needed at this time.

## 2018-07-01 NOTE — Telephone Encounter (Signed)
This should be able to be viewed through Epic by [facility]... is there someone else this should be sent to? Pt still unable to get appt d/t [facility] not being able to see the referral.   Varney Daily, CMA/ Lenore Manner, CMA

## 2018-07-01 NOTE — Telephone Encounter (Signed)
Pt calling back again today, as she called [facility] and they still do not have referral. Pt states she has been trying several weeks to schedule, but they will not without referral. She said the person at behavioral health advised if you could send through Epic, they can see and schedule her. [facility]

## 2018-07-02 NOTE — Telephone Encounter (Signed)
Patient came into the office to check on referral. [name] [name] re-faxed referral to [facility] while patient was waiting. [place] received fax, patient aware. No further assistance needed at this time.

## 2018-07-03 ENCOUNTER — Ambulatory Visit (INDEPENDENT_AMBULATORY_CARE_PROVIDER_SITE_OTHER): Payer: Medicaid Other | Admitting: Psychiatry

## 2018-07-03 ENCOUNTER — Encounter (HOSPITAL_COMMUNITY): Payer: Self-pay | Admitting: Psychiatry

## 2018-07-03 VITALS — BP 122/80 | HR 88 | Ht 64.0 in | Wt 276.0 lb

## 2018-07-03 DIAGNOSIS — F330 Major depressive disorder, recurrent, mild: Secondary | ICD-10-CM

## 2018-07-03 DIAGNOSIS — F411 Generalized anxiety disorder: Secondary | ICD-10-CM | POA: Insufficient documentation

## 2018-07-03 DIAGNOSIS — F410 Panic disorder [episodic paroxysmal anxiety] without agoraphobia: Secondary | ICD-10-CM

## 2018-07-03 MED ORDER — VORTIOXETINE HBR 10 MG PO TABS: 10.0000 mg | ORAL_TABLET | Freq: Every day | ORAL | 0 refills | Status: AC

## 2018-07-03 NOTE — Patient Instructions (Signed)
Plan:  1. Please try clonazepam one day to see how it affects you - please do not drive after you try  It for the first time.  2. Trial of Trintellix - start taking one tablet in AM with food. If you suffer from nausea, stomach upset for longer that 2-3 days  Take 1/2 tablet instead for a week.Then increase to a whole tablet.
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
## 2018-07-03 NOTE — Telephone Encounter (Signed)
I refaxed  The referral to [facility]  [phone] while the pt was in our office on 07-02-2018 and gave her a conformation that fax went through and also gave her a copy of her referral

## 2018-07-08 ENCOUNTER — Ambulatory Visit: Payer: Medicaid Other | Admitting: Family Medicine

## 2018-07-08 ENCOUNTER — Other Ambulatory Visit: Payer: Self-pay | Admitting: Family Medicine

## 2018-07-08 DIAGNOSIS — G43709 Chronic migraine without aura, not intractable, without status migrainosus: Secondary | ICD-10-CM

## 2018-07-10 ENCOUNTER — Telehealth (HOSPITAL_COMMUNITY): Payer: Self-pay | Admitting: *Deleted

## 2018-07-10 NOTE — Telephone Encounter (Signed)
Prior authorization for Trintellix received. Called [facility] spoke with Consuela who gave approval until 07/05/2019. [place] #[id number]. Called to notify pharmacy.

## 2018-07-14 ENCOUNTER — Encounter: Payer: Self-pay | Admitting: Family Medicine

## 2018-07-14 ENCOUNTER — Ambulatory Visit (INDEPENDENT_AMBULATORY_CARE_PROVIDER_SITE_OTHER): Payer: Medicaid Other | Admitting: Family Medicine

## 2018-07-14 VITALS — BP 120/78 | HR 84 | Temp 98.7°F | Resp 12 | Ht 64.0 in | Wt 282.1 lb

## 2018-07-14 DIAGNOSIS — F411 Generalized anxiety disorder: Secondary | ICD-10-CM | POA: Diagnosis not present

## 2018-07-14 DIAGNOSIS — F410 Panic disorder [episodic paroxysmal anxiety] without agoraphobia: Secondary | ICD-10-CM

## 2018-07-14 DIAGNOSIS — G4700 Insomnia, unspecified: Secondary | ICD-10-CM | POA: Diagnosis not present

## 2018-07-14 DIAGNOSIS — G43709 Chronic migraine without aura, not intractable, without status migrainosus: Secondary | ICD-10-CM | POA: Diagnosis not present

## 2018-07-14 NOTE — Progress Notes (Signed)
HPI:   [name] is a 38 y.o. female, who is here today for 3 months follow up.   She was last seen on 04/07/2018. Since her last visit she has established with psychiatrist, started on Trintellix 10 mg daily.  She has not started treatment because preauthorization, it was approved, so she is planning on picking it up from her pharmacy today.  I recommended clonazepam 0.5 mg daily as needed, she has not started medication. She denies depressed mood or suicidal thoughts. In general she feels like anxiety is getting better  Insomnia: She was on Amitriptyline 12.5 mg,which she was not taken daily because felt sleepy the next day. We discontinued Amitriptyline and started Topamax due to worsening headaches. Sleeping "fine" since Topamax was started, not drowsy feeling next day.  Migraine headaches: Last visit we started Topamax 50 mg at bedtime.  She has tolerated medication well. She has had 2 headaches since 04/07/2018, they were mild and lasted a few hours instead days.   She is also inquiring about when she is due for Pap smear. She was referred to gynecology because of abnormal Pap smear, she underwent LEEP procedure. She is not sure about how often she supposed to follow.   Review of Systems  Constitutional: Negative for activity change, appetite change, fatigue and fever.  Eyes: Negative for redness and visual disturbance.  Respiratory: Negative for cough, shortness of breath and wheezing.   Cardiovascular: Negative for chest pain, palpitations and leg swelling.  Gastrointestinal: Negative for abdominal pain, nausea and vomiting.       Negative for changes in bowel habits.  Neurological: Positive for headaches. Negative for syncope, weakness and numbness.  Psychiatric/Behavioral: Negative for confusion and suicidal ideas. The patient is nervous/anxious.      Current Outpatient Medications on File Prior to Visit  Medication Sig Dispense Refill  . ibuprofen  (ADVIL,MOTRIN) 200 MG tablet Take 400 mg by mouth every 6 (six) hours as needed for headache or moderate pain.    . magnesium oxide (MAG-OX) 400 (241.3 Mg) MG tablet Take 1 tablet (400 mg total) by mouth 2 (two) times daily. 4 tablet 0  . ondansetron (ZOFRAN ODT) 4 MG disintegrating tablet Take 1 tablet (4 mg total) by mouth every 8 (eight) hours as needed for nausea or vomiting. 20 tablet 0  . oxyCODONE-acetaminophen (PERCOCET/ROXICET) 5-325 MG tablet Take 1-2 tablets by mouth every 4 (four) hours as needed for moderate pain or severe pain. 20 tablet 0  . SPRINTEC 28 0.25-35 MG-MCG tablet TAKE 1 TABLET BY MOUTH DAILY 84 tablet 3  . topiramate (TOPAMAX) 50 MG tablet TAKE 1 TABLET BY MOUTH AT BEDTIME 90 tablet 0  . vitamin B-12 1000 MCG tablet Take 1 tablet (1,000 mcg total) by mouth daily. 30 tablet 0  . clonazePAM (KLONOPIN) 0.5 MG tablet Take 1 tablet (0.5 mg total) by mouth daily as needed for anxiety. (Patient not taking: Reported on 07/14/2018) 20 tablet 0  . vortioxetine HBr (TRINTELLIX) 10 MG TABS tablet Take 1 tablet (10 mg total) by mouth daily for 30 days. (Patient not taking: Reported on 07/14/2018) 30 tablet 0   No current facility-administered medications on file prior to visit.      Past Medical History:  Diagnosis Date  . Anxiety   . Asthma    childhood exercise induced only  . 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 Cholecystitis chronic, acute   . Depression   . Gallstones   . Headache(784.0)  migraines  . Heart murmur    no indications for 7-22yrs per pt.   No Known Allergies  Social History   Socioeconomic History  . Marital status: Single    Spouse name: Not on file  . Number of children: 3  . Years of education: Not on file  . Highest education level: Not on file  Occupational History    Comment: [employer]-part time  Social Needs  . Financial resource strain: Not on file  . Food insecurity:    Worry: Not on file    Inability: Not on file  . Transportation needs:      Medical: Not on file    Non-medical: Not on file  Tobacco Use  . Smoking status: Never Smoker  . Smokeless tobacco: Never Used  Substance and Sexual Activity  . Alcohol use: No  . Drug use: No  . Sexual activity: Yes    Partners: Male    Birth control/protection: Pill  Lifestyle  . Physical activity:    Days per week: Not on file    Minutes per session: Not on file  . Stress: Not on file  Relationships  . Social connections:    Talks on phone: Not on file    Gets together: Not on file    Attends religious service: Not on file    Active member of club or organization: Not on file    Attends meetings of clubs or organizations: Not on file    Relationship status: Not on file  Other Topics Concern  . Not on file  Social History Narrative   Single, lives with significant other [name])   #3 children: ages 13-11-2 1/2 months   Employer: [name]   Independent ADLs    Vitals:   07/14/18 0924  BP: 120/78  Pulse: 84  Resp: 12  Temp: 98.7 F (37.1 C)  SpO2: 96%   Body mass index is 48.43 kg/m.   Physical Exam  Nursing note and vitals reviewed. Constitutional: She is oriented to person, place, and time. She appears well-developed. No distress.  HENT:  Head: Normocephalic and atraumatic.  Mouth/Throat: Oropharynx is clear and moist and mucous membranes are normal.  Eyes: Pupils are equal, round, and reactive to light. Conjunctivae are normal.  Cardiovascular: Normal rate and regular rhythm.  No murmur heard. Respiratory: Effort normal and breath sounds normal. No respiratory distress.  Lymphadenopathy:    She has no cervical adenopathy.  Neurological: She is alert and oriented to person, place, and time. She has normal strength. No cranial nerve deficit. Gait normal.  Reflex Scores:      Bicep reflexes are 2+ on the right side and 2+ on the left side.      Patellar reflexes are 2+ on the right side and 2+ on the left side. Skin: Skin is warm. No rash noted. No  erythema.  Psychiatric: She has a normal mood and affect.  Well groomed, good eye contact.     ASSESSMENT AND PLAN:   [name] was seen today for 3 months follow-up.  No orders of the defined types were placed in this encounter.   Insomnia, unspecified Sleeping better since Topamax was started. Good sleep hygiene. Follow-up as needed.  GAD (generalized anxiety disorder) She has not taking clonazepam but would like to have it in case she needs it. We discussed side effects. Continue following with psychiatrist.  Migraine headache Greatly improved after starting Topamax. We discussed some side effects. No changes in current  management. Follow-up in 6 months and if headache is still well controlled we can consider following annually.    She is overdue for Pap smear, she was supposed to have a Pap smear 4 months after procedure. Strongly recommend arranging appointment with her gynecologist, she voices understanding and will do so.    Return in about 6 months (around 01/12/2019).      Dvid Pendry G. Martinique, MD  [name]. [place] office.

## 2018-07-14 NOTE — Assessment & Plan Note (Signed)
Sleeping better since Topamax was started. Good sleep hygiene. Follow-up as needed.

## 2018-07-14 NOTE — Patient Instructions (Signed)
A few things to remember from today's visit:   Insomnia, unspecified type  Panic disorder  Chronic migraine without aura without status migrainosus, not intractable   Please be sure medication list is accurate. If a new problem present, please set up appointment sooner than planned today.

## 2018-07-14 NOTE — Assessment & Plan Note (Signed)
Greatly improved after starting Topamax. We discussed some side effects. No changes in current management. Follow-up in 6 months and if headache is still well controlled we can consider following annually.

## 2018-07-14 NOTE — Assessment & Plan Note (Signed)
She has not taking clonazepam but would like to have it in case she needs it. We discussed side effects. Continue following with psychiatrist.

## 2018-07-31 ENCOUNTER — Encounter (HOSPITAL_COMMUNITY): Payer: Self-pay | Admitting: Psychiatry

## 2018-07-31 ENCOUNTER — Ambulatory Visit (INDEPENDENT_AMBULATORY_CARE_PROVIDER_SITE_OTHER): Payer: Medicaid Other | Admitting: Psychiatry

## 2018-07-31 VITALS — BP 111/74 | HR 78 | Ht 64.0 in | Wt 281.0 lb

## 2018-07-31 DIAGNOSIS — F330 Major depressive disorder, recurrent, mild: Secondary | ICD-10-CM | POA: Diagnosis not present

## 2018-07-31 DIAGNOSIS — F411 Generalized anxiety disorder: Secondary | ICD-10-CM

## 2018-07-31 DIAGNOSIS — F410 Panic disorder [episodic paroxysmal anxiety] without agoraphobia: Secondary | ICD-10-CM

## 2018-07-31 MED ORDER — ZALEPLON 5 MG PO CAPS: 5.0000 mg | ORAL_CAPSULE | Freq: Every evening | ORAL | 0 refills | Status: DC | PRN

## 2018-07-31 NOTE — Progress Notes (Signed)
BH MD/PA/NP OP Progress Note  07/31/2018 9:00 AM [name] [name]  MRN:  [id number]  Chief Complaint: anxiety, insomnia HPI: 38 yo female with GAD/panic disorder and hx of depression (currently in partial remission). She has tried various SSRIs which eventually stopped working (she also experience sexual adverse effects on these). We have started vortioxetine but she was unable to cut pills into half (teardrop shape) and has been taking 10 mg daily for the past 5 days only (it took a while to have it approved). She has severe nausea daily. Middle insomnia- her partner snores and once she wakes up she cannot fall asleep again. She has not tried clonazepam for anxiety/panic attacks yet but says she will this weekend. Her good friend died of MI recently and she will attend funeral this weekend.  Visit Diagnosis:    ICD-10-CM   1. GAD (generalized anxiety disorder) F41.1   2. Panic disorder F41.0   3. Mild episode of recurrent major depressive disorder (HCC) F33.0     Past Psychiatric History: Please see H&P in initial assessment note.  Past Medical History:  Past Medical History:  Diagnosis Date  . Anxiety   . Asthma    childhood exercise induced only  . 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 Cholecystitis chronic, acute   . Depression   . Gallstones   . Headache(784.0)    migraines  . Heart murmur    no indications for 7-29yrs per pt.    Past Surgical History:  Procedure Laterality Date  . APPENDECTOMY    . CESAREAN SECTION N/A 11/03/2012   Procedure: PRIMARY CESAREAN SECTION;  Surgeon: Melina Schools, MD;  Location: [facility];  Service: Obstetrics;  Laterality: N/A;  . CHOLECYSTECTOMY N/A 12/23/2012   Procedure: LAPAROSCOPIC CHOLECYSTECTOMY WITH INTRAOPERATIVE CHOLANGIOGRAM;  Surgeon: Pedro Earls, MD;  Location: [facility];  Service: General;  Laterality: N/A;  Laparoscopic Cholecystectomy with IOC  . COLON SURGERY    . ERCP N/A 12/22/2012   Procedure: ENDOSCOPIC RETROGRADE  CHOLANGIOPANCREATOGRAPHY (ERCP);  Surgeon: Gatha Mayer, MD;  Location: [facility];  Service: Endoscopy;  Laterality: N/A;  . INSERTION OF MESH N/A 07/15/2017   Procedure: INSERTION OF MESH;  Surgeon: Jovita Kussmaul, MD;  Location: [place];  Service: General;  Laterality: N/A;  . LAPAROTOMY N/A 11/08/2012   Procedure: laparoscopic exploration and EXPLORATORY LAPAROTOMY;  Surgeon: Merrie Roof, MD;  Location: [facility];  Service: General;  Laterality: N/A;  . VENTRAL HERNIA REPAIR N/A 07/15/2017   Procedure: LAPAROSCOPIC ASSISTED VENTRAL HERNIA REPAIR;  Surgeon: Jovita Kussmaul, MD;  Location: [place];  Service: General;  Laterality: N/A;    Family Psychiatric History: reviewed  Family History:  Family History  Problem Relation Age of Onset  . Cancer Father        Leukemia  . ADD / ADHD Other        family hx  . Breast cancer Other        fhx  . Mental illness Other        fhx  . Heart disease Other        fhx  . Hypertension Other        fhx    Social History:  Social History   Socioeconomic History  . Marital status: Single    Spouse name: Not on file  . Number of children: 3  . Years of education: Not on file  . Highest education level: Not on file  Occupational History  Comment: [employer]-part time  Social Needs  . Financial resource strain: Not on file  . Food insecurity:    Worry: Not on file    Inability: Not on file  . Transportation needs:    Medical: Not on file    Non-medical: Not on file  Tobacco Use  . Smoking status: Never Smoker  . Smokeless tobacco: Never Used  Substance and Sexual Activity  . Alcohol use: No  . Drug use: No  . Sexual activity: Yes    Partners: Male    Birth control/protection: Pill  Lifestyle  . Physical activity:    Days per week: Not on file    Minutes per session: Not on file  . Stress: Not on file  Relationships  . Social connections:    Talks on phone: Not on file    Gets together: Not on file    Attends religious  service: Not on file    Active member of club or organization: Not on file    Attends meetings of clubs or organizations: Not on file    Relationship status: Not on file  Other Topics Concern  . Not on file  Social History Narrative   Single, lives with significant other [name])   #3 children: ages 13-11-2 1/2 months   Employer: [name]   Independent ADLs    Allergies: No Known Allergies  Metabolic Disorder Labs: Lab Results  Component Value Date   HGBA1C 5.5 08/08/2007   No results found for: PROLACTIN Lab Results  Component Value Date   CHOL 190 06/18/2017   TRIG 145.0 06/18/2017   HDL 48.20 06/18/2017   CHOLHDL 4 06/18/2017   VLDL 29.0 06/18/2017   LDLCALC 113 (H) 06/18/2017   LDLCALC 105 (H) 04/20/2010   Lab Results  Component Value Date   TSH 1.00 04/20/2010   TSH 2.64 08/08/2007    Therapeutic Level Labs: No results found for: LITHIUM No results found for: VALPROATE No components found for:  CBMZ  Current Medications: Current Outpatient Medications  Medication Sig Dispense Refill  . clonazePAM (KLONOPIN) 0.5 MG tablet Take 1 tablet (0.5 mg total) by mouth daily as needed for anxiety. (Patient not taking: Reported on 07/14/2018) 20 tablet 0  . ibuprofen (ADVIL,MOTRIN) 200 MG tablet Take 400 mg by mouth every 6 (six) hours as needed for headache or moderate pain.    . magnesium oxide (MAG-OX) 400 (241.3 Mg) MG tablet Take 1 tablet (400 mg total) by mouth 2 (two) times daily. 4 tablet 0  . ondansetron (ZOFRAN ODT) 4 MG disintegrating tablet Take 1 tablet (4 mg total) by mouth every 8 (eight) hours as needed for nausea or vomiting. 20 tablet 0  . oxyCODONE-acetaminophen (PERCOCET/ROXICET) 5-325 MG tablet Take 1-2 tablets by mouth every 4 (four) hours as needed for moderate pain or severe pain. 20 tablet 0  . SPRINTEC 28 0.25-35 MG-MCG tablet TAKE 1 TABLET BY MOUTH DAILY 84 tablet 3  . topiramate (TOPAMAX) 50 MG tablet TAKE 1 TABLET BY MOUTH AT BEDTIME 90 tablet 0   . vitamin B-12 1000 MCG tablet Take 1 tablet (1,000 mcg total) by mouth daily. 30 tablet 0  . vortioxetine HBr (TRINTELLIX) 10 MG TABS tablet Take 1 tablet (10 mg total) by mouth daily for 30 days. (Patient not taking: Reported on 07/14/2018) 30 tablet 0   No current facility-administered medications for this visit.      Musculoskeletal: Strength & Muscle Tone: within normal limits Gait & Station: normal Patient leans:  N/A  Psychiatric Specialty Exam: Review of Systems  Constitutional: Negative.   HENT: Negative.   Eyes: Negative.   Respiratory: Negative.   Cardiovascular: Negative.   Gastrointestinal: Positive for nausea.  Genitourinary: Negative.   Musculoskeletal: Negative.   Skin: Negative.   Neurological: Positive for headaches.  Endo/Heme/Allergies: Negative.   Psychiatric/Behavioral: The patient is nervous/anxious.     There were no vitals taken for this visit.There is no height or weight on file to calculate BMI.  General Appearance: Casual and Well Groomed  Eye Contact:  Good  Speech:  Clear and Coherent  Volume:  Normal  Mood:  Anxious  Affect:  Full Range  Thought Process:  Goal Directed  Orientation:  Full (Time, Place, and Person)  Thought Content: Logical   Suicidal Thoughts:  No  Homicidal Thoughts:  No  Memory:  Immediate;   Good Recent;   Good Remote;   Good  Judgement:  Good  Insight:  Good  Psychomotor Activity:  Normal  Concentration:  Concentration: Good  Recall:  Good  Fund of Knowledge: Good  Language: Good  Akathisia:  Negative  Handed:  Right  AIMS (if indicated): not done  Assets:  Communication Skills Desire for Improvement Housing Resilience Transportation Vocational/Educational  ADL's:  Intact  Cognition: WNL  Sleep:  Fair   Screenings: PHQ2-9     Office Visit from 01/07/2017 in [place] at [facility] Total Score  4  PHQ-9 Total Score  14       Assessment and Plan: 38 yo female with hx of depression  and ongoing problems with GAD/panic disorder. She has started Trintellix for pas t5 days - 10 mg - severe nausea. It is practically impossible to cut in half to take 5 mg. I will give her a sample of 5 mg for next 7 days then she will increase dose back to 10 mg daily (with food). If she tolerates it we will be able to assess in 3-4 weeks if it controls anxiety well. We will add zeleplon 5 mg prn insomnia - it is unlikely to cause residual sedation in AM no matter what time of a night she takes it. Samara will return for next follow up visit in a month.   Stephanie Acre, MD 07/31/2018, 9:00 AM

## 2018-08-11 ENCOUNTER — Encounter (HOSPITAL_COMMUNITY): Payer: Self-pay

## 2018-08-25 ENCOUNTER — Other Ambulatory Visit (HOSPITAL_COMMUNITY): Payer: Self-pay | Admitting: Psychiatry

## 2018-08-28 ENCOUNTER — Telehealth (INDEPENDENT_AMBULATORY_CARE_PROVIDER_SITE_OTHER): Payer: Medicaid Other | Admitting: Psychiatry

## 2018-08-28 ENCOUNTER — Other Ambulatory Visit: Payer: Self-pay

## 2018-08-28 DIAGNOSIS — F330 Major depressive disorder, recurrent, mild: Secondary | ICD-10-CM | POA: Diagnosis not present

## 2018-08-28 DIAGNOSIS — F410 Panic disorder [episodic paroxysmal anxiety] without agoraphobia: Secondary | ICD-10-CM | POA: Diagnosis not present

## 2018-08-28 DIAGNOSIS — F411 Generalized anxiety disorder: Secondary | ICD-10-CM | POA: Diagnosis not present

## 2018-08-28 MED ORDER — CLONAZEPAM 0.5 MG PO TABS: 0.5000 mg | ORAL_TABLET | Freq: Every day | ORAL | 2 refills | Status: AC | PRN

## 2018-08-28 MED ORDER — VORTIOXETINE HBR 5 MG PO TABS: 5.0000 mg | ORAL_TABLET | Freq: Every day | ORAL | 2 refills | Status: AC

## 2018-08-28 MED ORDER — ZOLPIDEM TARTRATE 5 MG PO TABS: 5.0000 mg | ORAL_TABLET | Freq: Every evening | ORAL | 2 refills | Status: DC | PRN

## 2018-08-28 NOTE — Progress Notes (Signed)
BH MD/PA/NP OP Progress Note  08/28/2018 8:45 AM [name] [name]  MRN:  [id number]  Interview was conducted using teleconferencing and I verified that I was speaking with the correct person using two identifiers. I discussed the limitations of evaluation and management by telemedicine and  the availability of in person appointments. Patient expressed understanding and agreed to proceed.  Chief Complaint: Anxiety, insomnia HPI:  38 yo female with hx of depression and ongoing problems with GAD/panic disorder. She has started Trintellix 5 mg which she tolerates well but when she tried again to go up to 10 mg she severe nausea immediately developed. She has tried clonazepam (0.25 mg) prn anxiety it works well, is tolerated well but starts slowly (takes about an hour for it to work) and relaxing effect lasts several (up to 8) hours. Zaleplon 5 mg prn insomnia was still not approved so she did not try it for intermittent insomnia. Mood reportedly is stable, less anxious, no significant depression.   Visit Diagnosis:    ICD-10-CM   1. GAD (generalized anxiety disorder) F41.1   2. Panic disorder F41.0   3. Mild episode of recurrent major depressive disorder (HCC) F33.0     Past Psychiatric History: Please see initial intake H&P.  Past Medical History:  Past Medical History:  Diagnosis Date  . Anxiety   . Asthma    childhood exercise induced only  . 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 Cholecystitis chronic, acute   . Depression   . Gallstones   . Headache(784.0)    migraines  . Heart murmur    no indications for 7-62yrs per pt.    Past Surgical History:  Procedure Laterality Date  . APPENDECTOMY    . CESAREAN SECTION N/A 11/03/2012   Procedure: PRIMARY CESAREAN SECTION;  Surgeon: Melina Schools, MD;  Location: [facility];  Service: Obstetrics;  Laterality: N/A;  . CHOLECYSTECTOMY N/A 12/23/2012   Procedure: LAPAROSCOPIC CHOLECYSTECTOMY WITH INTRAOPERATIVE CHOLANGIOGRAM;  Surgeon: Pedro Earls,  MD;  Location: [facility];  Service: General;  Laterality: N/A;  Laparoscopic Cholecystectomy with IOC  . COLON SURGERY    . ERCP N/A 12/22/2012   Procedure: ENDOSCOPIC RETROGRADE CHOLANGIOPANCREATOGRAPHY (ERCP);  Surgeon: Gatha Mayer, MD;  Location: [facility];  Service: Endoscopy;  Laterality: N/A;  . INSERTION OF MESH N/A 07/15/2017   Procedure: INSERTION OF MESH;  Surgeon: Jovita Kussmaul, MD;  Location: [place];  Service: General;  Laterality: N/A;  . LAPAROTOMY N/A 11/08/2012   Procedure: laparoscopic exploration and EXPLORATORY LAPAROTOMY;  Surgeon: Merrie Roof, MD;  Location: [facility];  Service: General;  Laterality: N/A;  . VENTRAL HERNIA REPAIR N/A 07/15/2017   Procedure: LAPAROSCOPIC ASSISTED VENTRAL HERNIA REPAIR;  Surgeon: Jovita Kussmaul, MD;  Location: [place];  Service: General;  Laterality: N/A;    Family Psychiatric History: reviewed  Family History:  Family History  Problem Relation Age of Onset  . Cancer Father        Leukemia  . ADD / ADHD Other        family hx  . Breast cancer Other        fhx  . Mental illness Other        fhx  . Heart disease Other        fhx  . Hypertension Other        fhx    Social History:  Social History   Socioeconomic History  . Marital status: Single    Spouse  name: Not on file  . Number of children: 3  . Years of education: Not on file  . Highest education level: Not on file  Occupational History    Comment: [employer]-part time  Social Needs  . Financial resource strain: Not on file  . Food insecurity:    Worry: Not on file    Inability: Not on file  . Transportation needs:    Medical: Not on file    Non-medical: Not on file  Tobacco Use  . Smoking status: Never Smoker  . Smokeless tobacco: Never Used  Substance and Sexual Activity  . Alcohol use: No  . Drug use: No  . Sexual activity: Yes    Partners: Male    Birth control/protection: Pill  Lifestyle  . Physical activity:    Days per week: Not on file    Minutes  per session: Not on file  . Stress: Not on file  Relationships  . Social connections:    Talks on phone: Not on file    Gets together: Not on file    Attends religious service: Not on file    Active member of club or organization: Not on file    Attends meetings of clubs or organizations: Not on file    Relationship status: Not on file  Other Topics Concern  . Not on file  Social History Narrative   Single, lives with significant other [name] [name])   #3 children: ages 13-11-2 1/2 months   Employer: [name]   Independent ADLs    Allergies: No Known Allergies  Metabolic Disorder Labs: Lab Results  Component Value Date   HGBA1C 5.5 08/08/2007   No results found for: PROLACTIN Lab Results  Component Value Date   CHOL 190 06/18/2017   TRIG 145.0 06/18/2017   HDL 48.20 06/18/2017   CHOLHDL 4 06/18/2017   VLDL 29.0 06/18/2017   LDLCALC 113 (H) 06/18/2017   LDLCALC 105 (H) 04/20/2010   Lab Results  Component Value Date   TSH 1.00 04/20/2010   TSH 2.64 08/08/2007    Therapeutic Level Labs: No results found for: LITHIUM No results found for: VALPROATE No components found for:  CBMZ  Current Medications: Current Outpatient Medications  Medication Sig Dispense Refill  . clonazePAM (KLONOPIN) 0.5 MG tablet Take 1 tablet (0.5 mg total) by mouth daily as needed for anxiety. 30 tablet 2  . ibuprofen (ADVIL,MOTRIN) 200 MG tablet Take 400 mg by mouth every 6 (six) hours as needed for headache or moderate pain.    . magnesium oxide (MAG-OX) 400 (241.3 Mg) MG tablet Take 1 tablet (400 mg total) by mouth 2 (two) times daily. 4 tablet 0  . ondansetron (ZOFRAN ODT) 4 MG disintegrating tablet Take 1 tablet (4 mg total) by mouth every 8 (eight) hours as needed for nausea or vomiting. 20 tablet 0  . oxyCODONE-acetaminophen (PERCOCET/ROXICET) 5-325 MG tablet Take 1-2 tablets by mouth every 4 (four) hours as needed for moderate pain or severe pain. 20 tablet 0  . SPRINTEC 28 0.25-35 MG-MCG  tablet TAKE 1 TABLET BY MOUTH DAILY 84 tablet 3  . topiramate (TOPAMAX) 50 MG tablet TAKE 1 TABLET BY MOUTH AT BEDTIME 90 tablet 0  . vitamin B-12 1000 MCG tablet Take 1 tablet (1,000 mcg total) by mouth daily. 30 tablet 0  . vortioxetine HBr (TRINTELLIX) 5 MG TABS tablet Take 1 tablet (5 mg total) by mouth daily. 30 tablet 2  . zolpidem (AMBIEN) 5 MG tablet Take 1 tablet (5  mg total) by mouth at bedtime as needed for sleep. 30 tablet 2   No current facility-administered medications for this visit.      Psychiatric Specialty Exam: Review of Systems  Psychiatric/Behavioral: The patient is nervous/anxious and has insomnia.   All other systems reviewed and are negative.   There were no vitals taken for this visit.There is no height or weight on file to calculate BMI.  General Appearance: Unable to assess  Eye Contact:  Unable to assess  Speech:  Clear and Coherent  Volume:  Normal  Mood:  Anxious  Affect:  NA  Thought Process:  Goal Directed  Orientation:  Full (Time, Place, and Person)  Thought Content: Logical   Suicidal Thoughts:  No  Homicidal Thoughts:  No  Memory:  Immediate;   Good Recent;   Good Remote;   Good  Judgement:  Good  Insight:  Good  Psychomotor Activity:  NA  Concentration:  Concentration: Good  Recall:  Good  Fund of Knowledge: Good  Language: Good  Akathisia:  NA  Handed:  Right  AIMS (if indicated): not done  Assets:  Communication Skills Desire for Improvement Financial Resources/Insurance Housing Physical Health Vocational/Educational  ADL's:  Intact  Cognition: WNL  Sleep:  Fair   Screenings: PHQ2-9     Office Visit from 01/07/2017 in [place] at [employer] Total Score  4  PHQ-9 Total Score  14       Assessment and Plan: 38 yo female with hx of depression and ongoing problems with GAD/panic disorder. She has started Trintellix 5 mg which she tolerates well but when she tried again to go up to 10 mg she severe nausea  immediately developed. She has tried clonazepam (0.25 mg) prn anxiety it works well, is tolerated well but starts slowly (takes about an hour for it to work) and relaxing effect lasts several (up to 8) hours. Zaleplon 5 mg prn insomnia was still not approved so she did not try it for intermittent insomnia. Mood reportedly is stable, less anxious, no significant depression.  PLAN: Continue vortioxetine 5 mg daily and clonazepam 0.25-0.5 mg daily pen anxiety. We will try zolpidem 5 mg prn insomnia instead of zaleplon (nonapproved). Given overall improvement in mood next visit will occur in 3 months (or prn).    Stephanie Acre, MD 08/28/2018, 8:45 AM

## 2018-09-03 ENCOUNTER — Telehealth (HOSPITAL_COMMUNITY): Payer: Self-pay

## 2018-09-03 NOTE — Telephone Encounter (Signed)
Medication management - Telephone call with Abigail Butts, representative with [employer] to initiate pt's prior authorization for Trintellex 5mg , one a day. KJ#[id number] and interaction # [id number]. Informed to call back in 24 hours for decision.

## 2018-09-12 ENCOUNTER — Ambulatory Visit: Payer: Self-pay

## 2018-09-12 DIAGNOSIS — G43909 Migraine, unspecified, not intractable, without status migrainosus: Secondary | ICD-10-CM | POA: Diagnosis not present

## 2018-09-12 NOTE — Telephone Encounter (Signed)
Pt called stating that she has the worse migraine ever.  She has Hx of migraines that come on with her menses.  She is on her cycle now.  Pt states that she had some hydrocodone from a trip to the ER and took a half last night which she states took the edge off her head.  Her headache involves the left side of the head from the left eye all over the left side of the head. Per protocol pt will go to urgent care for evaluation of her headache. Care advice read to patient. Pt verbalized understanding of all instructions.  Reason for Disposition . [1] SEVERE headache (e.g., excruciating) AND [2] not improved after 2 hours of pain medicine  Answer Assessment - Initial Assessment Questions 1. LOCATION: "Where does it hurt?"     Left side top left eye 2. ONSET: "When did the headache start?" (Minutes, hours or days)     Sunday 3. PATTERN: "Does the pain come and go, or has it been constant since it started?"     constant 4. SEVERITY: "How bad is the pain?" and "What does it keep you from doing?"  (e.g., Scale 1-10; mild, moderate, or severe)   - MILD (1-3): doesn\'t interfere with normal activities    - MODERATE (4-7): interferes with normal activities or awakens from sleep    - SEVERE (8-10): excruciating pain, unable to do any normal activities        10  5. RECURRENT SYMPTOM: "Have you ever had headaches before?" If so, ask: "When was the last time?" and "What happened that time?"      Yes has  6. CAUSE: "What do you think is causing the headache?"     Migrains 7. MIGRAINE: "Have you been diagnosed with migraine headaches?" If so, ask: "Is this headache similar?"      Worse in months 8. HEAD INJURY: "Has there been any recent injury to the head?"      No 9. OTHER SYMPTOMS: "Do you have any other symptoms?" (fever, stiff neck, eye pain, sore throat, cold symptoms)    Left eye pressure 10. PREGNANCY: "Is there any chance you are pregnant?" "When was your last menstrual period?"       Menstrual  cycle now  Protocols used: HEADACHE-A-AH

## 2018-09-15 ENCOUNTER — Other Ambulatory Visit: Payer: Self-pay

## 2018-09-15 ENCOUNTER — Ambulatory Visit (INDEPENDENT_AMBULATORY_CARE_PROVIDER_SITE_OTHER): Payer: Medicaid Other | Admitting: Family Medicine

## 2018-09-15 ENCOUNTER — Encounter: Payer: Self-pay | Admitting: Family Medicine

## 2018-09-15 VITALS — HR 84 | Resp 12

## 2018-09-15 DIAGNOSIS — G4700 Insomnia, unspecified: Secondary | ICD-10-CM

## 2018-09-15 DIAGNOSIS — F330 Major depressive disorder, recurrent, mild: Secondary | ICD-10-CM | POA: Diagnosis not present

## 2018-09-15 DIAGNOSIS — G43709 Chronic migraine without aura, not intractable, without status migrainosus: Secondary | ICD-10-CM | POA: Diagnosis not present

## 2018-09-15 MED ORDER — BACLOFEN 10 MG PO TABS: 10.0000 mg | ORAL_TABLET | Freq: Three times a day (TID) | ORAL | 0 refills | Status: DC

## 2018-09-15 NOTE — Assessment & Plan Note (Signed)
Adequately controlled, she is well dealing with current public health events. No changes in current management. Continue following with psychiatrist and psychotherapy.

## 2018-09-15 NOTE — Assessment & Plan Note (Signed)
Good sleep hygiene. It could be contributing to headache recurrence. Ambien side effects discussed. Continue following with psychiatrist.

## 2018-09-15 NOTE — Progress Notes (Signed)
Virtual Visit via Video Note   I connected with [name] on 09/15/18 at 10:45 AM EDT by a video enabled telemedicine application and verified that I am speaking with the correct person using two identifiers.  Location patient: home Location provider:home office Persons participating in the virtual visit: patient, provider  I discussed the limitations of evaluation and management by telemedicine and the availability of in person appointments. The patient expressed understanding and agreed to proceed.   HPI: [name] is a 38 years old female with history of anxiety, depression, insomnia, and migraines; who I am seeing today because of 7 to 10 days of headache. About a week ago she started with frontal, bitemporal, and occipital tightness sensation headache. She took Excedrin Migraine, ibuprofen, and Tylenol but neither of those helped. She also took Hydrocodone-Acetaminophen 5-325 mg she had left from old Rx, took 1/2 tab. It got "pretty bad" 3 days ago, so she went to urgent care.  According to patient, she received a Toradol injection as well as Compazine and Benadryl.  2 days ago headache resolved for 1 day and yesterday started again with bitemporal and parietal dull headache, 4/10.  She states that this headache is milder than her regular migraines, she cannot function.  Headaches have not been associated with visual changes, fever, chills, dizziness, or focal deficit.  She is currently on Topamax 50 mg daily for migraines, initially was helping but it does not seem to be doing so now.  Depression and anxiety, she is currently on Trintellix and clonazepam. She was on amitriptyline in the past, it did not help with headaches.  Insomnia, amitriptyline was helping. Her psychiatrist recently prescribed Ambien, but she has not picked it up. She has not identified exacerbating or alleviating factors. Denies hallucinations or maniac like symptoms.   ROS: See pertinent positives and  negatives per HPI.  Past Medical History:  Diagnosis Date  . Anxiety   . Asthma    childhood exercise induced only  . 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 Cholecystitis chronic, acute   . Depression   . Gallstones   . Headache(784.0)    migraines  . Heart murmur    no indications for 7-75yrs per pt.    Past Surgical History:  Procedure Laterality Date  . APPENDECTOMY    . CESAREAN SECTION N/A 11/03/2012   Procedure: PRIMARY CESAREAN SECTION;  Surgeon: Melina Schools, MD;  Location: [facility];  Service: Obstetrics;  Laterality: N/A;  . CHOLECYSTECTOMY N/A 12/23/2012   Procedure: LAPAROSCOPIC CHOLECYSTECTOMY WITH INTRAOPERATIVE CHOLANGIOGRAM;  Surgeon: Pedro Earls, MD;  Location: [facility];  Service: General;  Laterality: N/A;  Laparoscopic Cholecystectomy with IOC  . COLON SURGERY    . ERCP N/A 12/22/2012   Procedure: ENDOSCOPIC RETROGRADE CHOLANGIOPANCREATOGRAPHY (ERCP);  Surgeon: Gatha Mayer, MD;  Location: [facility];  Service: Endoscopy;  Laterality: N/A;  . INSERTION OF MESH N/A 07/15/2017   Procedure: INSERTION OF MESH;  Surgeon: Jovita Kussmaul, MD;  Location: [facility];  Service: General;  Laterality: N/A;  . LAPAROTOMY N/A 11/08/2012   Procedure: laparoscopic exploration and EXPLORATORY LAPAROTOMY;  Surgeon: Merrie Roof, MD;  Location: [facility];  Service: General;  Laterality: N/A;  . VENTRAL HERNIA REPAIR N/A 07/15/2017   Procedure: LAPAROSCOPIC ASSISTED VENTRAL HERNIA REPAIR;  Surgeon: Jovita Kussmaul, MD;  Location: [facility];  Service: General;  Laterality: N/A;    Family History  Problem Relation Age of Onset  . Cancer Father  Leukemia  . ADD / ADHD Other        family hx  . Breast cancer Other        fhx  . Mental illness Other        fhx  . Heart disease Other        fhx  . Hypertension Other        fhx    Social History   Socioeconomic History  . Marital status: Single    Spouse name: Not on file  . Number of children: 3  . Years of education: Not on file   . Highest education level: Not on file  Occupational History    Comment: [employer]-part time  Social Needs  . Financial resource strain: Not on file  . Food insecurity:    Worry: Not on file    Inability: Not on file  . Transportation needs:    Medical: Not on file    Non-medical: Not on file  Tobacco Use  . Smoking status: Never Smoker  . Smokeless tobacco: Never Used  Substance and Sexual Activity  . Alcohol use: No  . Drug use: No  . Sexual activity: Yes    Partners: Male    Birth control/protection: Pill  Lifestyle  . Physical activity:    Days per week: Not on file    Minutes per session: Not on file  . Stress: Not on file  Relationships  . Social connections:    Talks on phone: Not on file    Gets together: Not on file    Attends religious service: Not on file    Active member of club or organization: Not on file    Attends meetings of clubs or organizations: Not on file    Relationship status: Not on file  . Intimate partner violence:    Fear of current or ex partner: Not on file    Emotionally abused: Not on file    Physically abused: Not on file    Forced sexual activity: Not on file  Other Topics Concern  . Not on file  Social History Narrative   Single, lives with significant other [name] [name])   #3 children: ages 13-11-2 1/2 months   Employer: [name]   Independent ADLs      Current Outpatient Medications:  .  baclofen (LIORESAL) 10 MG tablet, Take 1 tablet (10 mg total) by mouth 3 (three) times daily., Disp: 30 each, Rfl: 0 .  clonazePAM (KLONOPIN) 0.5 MG tablet, Take 1 tablet (0.5 mg total) by mouth daily as needed for anxiety., Disp: 30 tablet, Rfl: 2 .  ibuprofen (ADVIL,MOTRIN) 200 MG tablet, Take 400 mg by mouth every 6 (six) hours as needed for headache or moderate pain., Disp: , Rfl:  .  magnesium oxide (MAG-OX) 400 (241.3 Mg) MG tablet, Take 1 tablet (400 mg total) by mouth 2 (two) times daily., Disp: 4 tablet, Rfl: 0 .  ondansetron (ZOFRAN ODT)  4 MG disintegrating tablet, Take 1 tablet (4 mg total) by mouth every 8 (eight) hours as needed for nausea or vomiting., Disp: 20 tablet, Rfl: 0 .  oxyCODONE-acetaminophen (PERCOCET/ROXICET) 5-325 MG tablet, Take 1-2 tablets by mouth every 4 (four) hours as needed for moderate pain or severe pain., Disp: 20 tablet, Rfl: 0 .  SPRINTEC 28 0.25-35 MG-MCG tablet, TAKE 1 TABLET BY MOUTH DAILY, Disp: 84 tablet, Rfl: 3 .  topiramate (TOPAMAX) 50 MG tablet, TAKE 1 TABLET BY MOUTH AT BEDTIME, Disp: 90 tablet, Rfl: 0 .  vitamin B-12 1000 MCG tablet, Take 1 tablet (1,000 mcg total) by mouth daily., Disp: 30 tablet, Rfl: 0 .  vortioxetine HBr (TRINTELLIX) 5 MG TABS tablet, Take 1 tablet (5 mg total) by mouth daily., Disp: 30 tablet, Rfl: 2 .  zolpidem (AMBIEN) 5 MG tablet, Take 1 tablet (5 mg total) by mouth at bedtime as needed for sleep., Disp: 30 tablet, Rfl: 2  EXAM:  VITALS per patient if applicable:Pulse 84   Resp 12   LMP 09/14/2018   GENERAL: alert, oriented, appears well and in no acute distress  HEENT: atraumatic, conjunttiva clear, no obvious abnormalities on inspection of face.  NECK: normal movements of the head and neck  LUNGS: on inspection no signs of respiratory distress, breathing rate appears normal, no obvious gross SOB, gasping or wheezing  CV: no obvious cyanosis  MS: moves all visible extremities without noticeable abnormality  PSYCH/NEURO: pleasant and cooperative, no obvious depression. Anxious. Speech and thought processing grossly intact No focal deficit appreciated.   ASSESSMENT AND PLAN:  Discussed the following assessment and plan:  Orders Placed This Encounter  Procedures  . Ambulatory referral to Neurology    Insomnia, unspecified Good sleep hygiene. It could be contributing to headache recurrence. Ambien side effects discussed. Continue following with psychiatrist.   Migraine headache Hx provided today + findings upon inspection do not suggest a  serious process, I do nit think head imaging is needed today. Migraine headache vs tension headache. No changes in Topamax 50 mg daily, we discussed side effects as well as the risk of interaction with some of her medications. Baclofen 10 mg twice daily as needed may help. Adequate sleep may also help. Instructed about warning signs. Neurology referral was placed.  Mild episode of recurrent major depressive disorder ([place]) Adequately controlled, she is well dealing with current public health events. No changes in current management. Continue following with psychiatrist and psychotherapy.     I discussed the assessment and treatment plan with Ms Zalar. She was provided an opportunity to ask questions and all were answered. The patient agreed with the plan and demonstrated an understanding of the instructions.   She was advised to call back or seek an in-person evaluation if the symptoms worsen or if the condition fails to improve as anticipated.  Return if symptoms worsen or fail to improve, for Keep next f/u appt.    Fleetwood Pierron Martinique, MD

## 2018-09-15 NOTE — Assessment & Plan Note (Signed)
Hx provided today + findings upon inspection do not suggest a serious process, I do nit think head imaging is needed today. Migraine headache vs tension headache. No changes in Topamax 50 mg daily, we discussed side effects as well as the risk of interaction with some of her medications. Baclofen 10 mg twice daily as needed may help. Adequate sleep may also help. Instructed about warning signs. Neurology referral was placed.

## 2018-09-15 NOTE — Telephone Encounter (Signed)
Appointment scheduled.

## 2018-10-02 NOTE — Progress Notes (Signed)
Virtual Visit via Video Note The purpose of this virtual visit is to provide medical care while limiting exposure to the novel coronavirus.    Consent was obtained for video visit:  Yes Answered questions that patient had about telehealth interaction:  Yes I discussed the limitations, risks, security and privacy concerns of performing an evaluation and management service by telemedicine. I also discussed with the patient that there may be a patient responsible charge related to this service. The patient expressed understanding and agreed to proceed.  Pt location: Home Physician Location: Home Name of referring provider:  Martinique, Betty G, MD I connected with [name] [name] at patients initiation/request on 10/03/2018 at 12:50 PM EDT by video enabled telemedicine application and verified that I am speaking with the correct person using two identifiers. Pt MRN:  [id number] Pt DOB:  04/07/1981 Video Participants:  [name] [name]   History of Present Illness:  [name] [name] is a 38 year old female with anxiety, depression, insomnia and migraines and history of carcinoid tumor of appendix who presents for migraines.  History supplemented by Urgent Care and referring provider notes.  Onset:  Around puberty.  Gradually getting worse over the years.  She was seen in the Urgent Care on 09/12/18 for an intractable migraine that didn't respond to OTC or oxycodone that lasted a week, where she received a headache cocktail of Toradol 30mg , Compazine 10mg  and Benadryl 25mg  which took the edge off but did not break it.   Location:  Left frontal and temporal region as well as a tenderness on the back of her head. Quality:  stabbing Intensity:  6-8/10.  She denies new headache, thunderclap headache Aura:  no Premonitory Phase:  no Postdrome:  Feels off-kilter Associated symptoms:  Nausea, photophobia, phonophobia, sometimes vomiting.  She denies associated visual disturbance, autonomic symptoms or  unilateral numbness or weakness. Duration:  Usually last 2 days Frequency:  Severe migraines occurs 2-3 days prior to onset of menses.  More moderate headaches occur in between.  5 days a month. Frequency of abortive medication: 5 days a month Triggers:  Hormonal, emotional stress, lack of sleep Relieving factors:  Cool washcloth, lay down Activity:  Aggravates  Current NSAIDS:  ibuprofen Current analgesics:  Tylenol, Excedrin Migraine Current triptans:  none Current ergotamine:  none Current anti-emetic:  Zofran ODT 4mg  Current muscle relaxants:  baclofen Current anti-anxiolytic:  clonazepam Current sleep aide:  none Current Antihypertensive medications:  none Current Antidepressant medications:  Trintellix Current Anticonvulsant medications:  Topirmate 50mg  at bedtime Current anti-CGRP:  none Current Vitamins/Herbal/Supplements:  B12 Current Antihistamines/Decongestants:  none Other therapy:  none Hormone/birth control:  Sprintec (at least a little over a year, no change in headaches)  Past NSAIDS:  Ibuprofen, naproxen Past analgesics:  none Past abortive triptans:   Past abortive ergotamine:  none Past muscle relaxants:  Flexeril Past anti-emetic:  promethazine Past antihypertensive medications:  none Past antidepressant medications:  Amitriptyline, Effexor XR 75mg , Celexa, Prozac Past anticonvulsant medications:  none Past anti-CGRP:  none Past vitamins/Herbal/Supplements:  none Past antihistamines/decongestants:  none Other past therapies:  none  Caffeine:  No coffee.  Drinks 16-18 oz of soda daily, including Coke Alcohol:  Rarely (once every 6 months) Diet:  Drinks Coke or Sprite.  Drinks less than 8 oz water daily.  Skips meals. Exercise:  Before gym shut down, was going 2 times a week Depression:  stable; Anxiety:  some Other pain:  no Sleep hygiene:  Poor.  Difficulty falling asleep Family history of  headache:  Mother, brother  CBC and CMP from 03/07/18 were  normal.  Past Medical History: Past Medical History:  Diagnosis Date  . Anxiety   . Asthma    childhood exercise induced only  . 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 Cholecystitis chronic, acute   . Depression   . Gallstones   . Headache(784.0)    migraines  . Heart murmur    no indications for 7-43yrs per pt.    Medications: Outpatient Encounter Medications as of 10/03/2018  Medication Sig  . baclofen (LIORESAL) 10 MG tablet Take 1 tablet (10 mg total) by mouth 3 (three) times daily.  . clonazePAM (KLONOPIN) 0.5 MG tablet Take 1 tablet (0.5 mg total) by mouth daily as needed for anxiety.  Marland Kitchen ibuprofen (ADVIL,MOTRIN) 200 MG tablet Take 400 mg by mouth every 6 (six) hours as needed for headache or moderate pain.  . magnesium oxide (MAG-OX) 400 (241.3 Mg) MG tablet Take 1 tablet (400 mg total) by mouth 2 (two) times daily.  . ondansetron (ZOFRAN ODT) 4 MG disintegrating tablet Take 1 tablet (4 mg total) by mouth every 8 (eight) hours as needed for nausea or vomiting.  Marland Kitchen oxyCODONE-acetaminophen (PERCOCET/ROXICET) 5-325 MG tablet Take 1-2 tablets by mouth every 4 (four) hours as needed for moderate pain or severe pain.  Marland Kitchen SPRINTEC 28 0.25-35 MG-MCG tablet TAKE 1 TABLET BY MOUTH DAILY  . topiramate (TOPAMAX) 50 MG tablet TAKE 1 TABLET BY MOUTH AT BEDTIME  . vitamin B-12 1000 MCG tablet Take 1 tablet (1,000 mcg total) by mouth daily.  Marland Kitchen vortioxetine HBr (TRINTELLIX) 5 MG TABS tablet Take 1 tablet (5 mg total) by mouth daily.  Marland Kitchen zolpidem (AMBIEN) 5 MG tablet Take 1 tablet (5 mg total) by mouth at bedtime as needed for sleep.   No facility-administered encounter medications on file as of 10/03/2018.     Allergies: No Known Allergies  Family History: Family History  Problem Relation Age of Onset  . Cancer Father        Leukemia  . ADD / ADHD Other        family hx  . Breast cancer Other        fhx  . Mental illness Other        fhx  . Heart disease Other        fhx  . Hypertension Other         fhx    Social History: Social History   Socioeconomic History  . Marital status: Single    Spouse name: Not on file  . Number of children: 3  . Years of education: Not on file  . Highest education level: Not on file  Occupational History    Comment: [employer]-part time  Social Needs  . Financial resource strain: Not on file  . Food insecurity:    Worry: Not on file    Inability: Not on file  . Transportation needs:    Medical: Not on file    Non-medical: Not on file  Tobacco Use  . Smoking status: Never Smoker  . Smokeless tobacco: Never Used  Substance and Sexual Activity  . Alcohol use: No  . Drug use: No  . Sexual activity: Yes    Partners: Male    Birth control/protection: Pill  Lifestyle  . Physical activity:    Days per week: Not on file    Minutes per session: Not on file  . Stress: Not on file  Relationships  .  Social connections:    Talks on phone: Not on file    Gets together: Not on file    Attends religious service: Not on file    Active member of club or organization: Not on file    Attends meetings of clubs or organizations: Not on file    Relationship status: Not on file  . Intimate partner violence:    Fear of current or ex partner: Not on file    Emotionally abused: Not on file    Physically abused: Not on file    Forced sexual activity: Not on file  Other Topics Concern  . Not on file  Social History Narrative   Single, lives with significant other [name])   #3 children: ages 13-11-2 1/2 months   Employer: [name]   Independent ADLs    Observations/Objective:   Height 5\' 4"  (1.626 m), weight 283 lb (128.4 kg), last menstrual period 09/14/2018. Alert and oriented.  Speech fluent and not dysarthric.  Language intact.  Eyes orthophoric on primary gaze and move in all directions.  Face symmetric.  Assessment and Plan:   Menstrually related migraine, without status migrainosus, not intractable  1.  For preventative management,  titrate topiramate to 75mg  at bedtime for one week, then 100mg  at bedtime.  Contact me in 6 weeks with update. 2.  For abortive therapy, sumatriptan 100mg  3.  Limit use of pain relievers to no more than 2 days out of week to prevent risk of rebound or medication-overuse headache. 4.  Keep headache diary 5.  Exercise, hydration, caffeine cessation, sleep hygiene, monitor for and avoid triggers 6.  Consider:  magnesium citrate 400mg  daily, riboflavin 400mg  daily, and coenzyme Q10 100mg  three times daily 7. Always keep in mind that currently taking a hormone or birth control may be a possible trigger or aggravating factor for migraine. 8. Follow up in 4 months.  Follow Up Instructions:    -I discussed the assessment and treatment plan with the patient. The patient was provided an opportunity to ask questions and all were answered. The patient agreed with the plan and demonstrated an understanding of the instructions.   The patient was advised to call back or seek an in-person evaluation if the symptoms worsen or if the condition fails to improve as anticipated.    Dudley Major, DO

## 2018-10-03 ENCOUNTER — Telehealth (INDEPENDENT_AMBULATORY_CARE_PROVIDER_SITE_OTHER): Payer: Medicaid Other | Admitting: Neurology

## 2018-10-03 ENCOUNTER — Encounter: Payer: Self-pay | Admitting: Neurology

## 2018-10-03 ENCOUNTER — Telehealth: Payer: Self-pay

## 2018-10-03 ENCOUNTER — Other Ambulatory Visit: Payer: Self-pay

## 2018-10-03 VITALS — Ht 64.0 in | Wt 283.0 lb

## 2018-10-03 DIAGNOSIS — G43829 Menstrual migraine, not intractable, without status migrainosus: Secondary | ICD-10-CM | POA: Diagnosis not present

## 2018-10-03 MED ORDER — TOPIRAMATE 100 MG PO TABS: 100.0000 mg | ORAL_TABLET | Freq: Every day | ORAL | 3 refills | Status: DC

## 2018-10-03 MED ORDER — SUMATRIPTAN SUCCINATE 100 MG PO TABS: ORAL_TABLET | ORAL | 3 refills | Status: AC

## 2018-10-03 NOTE — Telephone Encounter (Signed)
Called Pt to confirm information for virtual visit. No answer, LMOVM X2, last attempt did not leave message.

## 2018-10-03 NOTE — Patient Instructions (Signed)
Migraine Recommendations: 1.  Increase topiramate to 75mg  at bedtime for one week, then start 100mg  at bedtime.  Contact me in 6 weeks with update.. 2.  Take sumatriptan 100mg  at earliest onset of headache.  May repeat dose once in 2 hours if needed.  Do not exceed two tablets in 24 hours. 3.  Limit use of pain relievers to no more than 2 days out of the week.  These medications include acetaminophen, ibuprofen, triptans and narcotics.  This will help reduce risk of rebound headaches. 4.  Be aware of common food triggers such as processed sweets, processed foods with nitrites (such as deli meat, hot dogs, sausages), foods with MSG, alcohol (such as wine), chocolate, certain cheeses, certain fruits (dried fruits, bananas, pineapple), vinegar, diet soda. 4.  Avoid caffeine 5.  Routine exercise 6.  Proper sleep hygiene 7.  Stay adequately hydrated with water 8.  Keep a headache diary. 9.  Maintain proper stress management. 10.  Do not skip meals. 11.  Consider supplements:  Magnesium citrate 400mg  to 600mg  daily, riboflavin 400mg , Coenzyme Q 10 100mg  three times daily 12.  Follow up in 4 months.

## 2019-01-12 ENCOUNTER — Encounter: Payer: Self-pay | Admitting: Family Medicine

## 2019-01-12 ENCOUNTER — Other Ambulatory Visit: Payer: Self-pay

## 2019-01-12 ENCOUNTER — Ambulatory Visit: Payer: Medicaid Other | Admitting: Family Medicine

## 2019-01-12 VITALS — BP 120/82 | HR 88 | Temp 97.8°F | Resp 12 | Ht 64.0 in | Wt 282.1 lb

## 2019-01-12 DIAGNOSIS — G43709 Chronic migraine without aura, not intractable, without status migrainosus: Secondary | ICD-10-CM | POA: Diagnosis not present

## 2019-01-12 DIAGNOSIS — F411 Generalized anxiety disorder: Secondary | ICD-10-CM

## 2019-01-12 DIAGNOSIS — R87613 High grade squamous intraepithelial lesion on cytologic smear of cervix (HGSIL): Secondary | ICD-10-CM | POA: Diagnosis not present

## 2019-01-12 DIAGNOSIS — Z30011 Encounter for initial prescription of contraceptive pills: Secondary | ICD-10-CM

## 2019-01-12 DIAGNOSIS — E559 Vitamin D deficiency, unspecified: Secondary | ICD-10-CM | POA: Diagnosis not present

## 2019-01-12 DIAGNOSIS — E538 Deficiency of other specified B group vitamins: Secondary | ICD-10-CM | POA: Diagnosis not present

## 2019-01-12 LAB — VITAMIN B12: Vitamin B-12: 221 pg/mL (ref 211–911)

## 2019-01-12 LAB — VITAMIN D 25 HYDROXY (VIT D DEFICIENCY, FRACTURES): VITD: 12.65 ng/mL — ABNORMAL LOW (ref 30.00–100.00)

## 2019-01-12 MED ORDER — VITAMIN D (ERGOCALCIFEROL) 1.25 MG (50000 UNIT) PO CAPS: ORAL_CAPSULE | ORAL | 0 refills | Status: DC

## 2019-01-12 MED ORDER — NORGESTIMATE-ETH ESTRADIOL 0.25-35 MG-MCG PO TABS: 1.0000 | ORAL_TABLET | Freq: Every day | ORAL | 3 refills | Status: DC

## 2019-01-12 NOTE — Progress Notes (Signed)
HPI:   [name] is a 38 y.o. female, who is here today for chronic disease management.  She was last seen on 09/15/18. Since her last OV she has followed with her psychiatrist and neurologist.  She was started on Topamax 100 mg at bedtime. Medication has helped. She has had 2 episodes of migraine headaches.  Stopped sodas intake. She is sleeping better.  Anxiety and depression: She follows with psychiatrist, Dr Montel Culver. Last visit on 08/28/18. She is on Vortioxetine 5 mg daily and Clonazepam 0.5 mg 1/2-1 mg daily prn. In general she feels like symptoms are better controlled. Concerned about daughter starting College in a few days.  Following every 3 months.   Requesting refills on OCP's. Tolerating medication well.  LMP 12/31/18.  B12 deficiency,she is on B12 1000 mcg daily. Lab Results  Component Value Date   VITAMINB12 176 (L) 03/02/2018   Vit D deficiency: She is not on vitamin D supplementation. 03/07/2018 25 OH vit D was low at 13.55  History of abnormal Pap smear, she states that appointment was canceled when COVID-19 pandemia started, she has not called to reschedule. She denies pelvic pain, vaginal bleeding or discharge. . Component 56yr ago  Adequacy Satisfactory for evaluation endocervical/transformation zone component PRESENT.Abnormal    Diagnosis HIGH GRADE SQUAMOUS INTRAEPITHELIAL LESION: CIN-2/ CIN-3/CIS (HSIL).Abnormal    HPV DETECTEDAbnormal    Comment: Normal Reference Range - NOT Detected  Material Submitted CervicoVaginal Pap [ThinPrep Imaged]Abnormal    Resulting Agency [facility]    Review of Systems  Constitutional: Negative for activity change, appetite change, fatigue and fever.  HENT: Negative for mouth sores and nosebleeds.   Eyes: Negative for pain and visual disturbance.  Respiratory: Negative for cough, shortness of breath and wheezing.   Cardiovascular: Negative for chest pain, palpitations and leg swelling.   Gastrointestinal: Negative for abdominal pain, nausea and vomiting.       Negative for changes in bowel habits.  Genitourinary: Negative for decreased urine volume, dysuria, hematuria and pelvic pain.  Neurological: Negative for syncope, facial asymmetry and weakness.  Psychiatric/Behavioral: Negative for confusion and hallucinations.  Rest see pertinent positives and negatives per HPI.   Current Outpatient Medications on File Prior to Visit  Medication Sig Dispense Refill  . baclofen (LIORESAL) 10 MG tablet Take 1 tablet (10 mg total) by mouth 3 (three) times daily. 30 each 0  . ibuprofen (ADVIL,MOTRIN) 200 MG tablet Take 400 mg by mouth every 6 (six) hours as needed for headache or moderate pain.    . magnesium oxide (MAG-OX) 400 (241.3 Mg) MG tablet Take 1 tablet (400 mg total) by mouth 2 (two) times daily. 4 tablet 0  . ondansetron (ZOFRAN ODT) 4 MG disintegrating tablet Take 1 tablet (4 mg total) by mouth every 8 (eight) hours as needed for nausea or vomiting. 20 tablet 0  . oxyCODONE-acetaminophen (PERCOCET/ROXICET) 5-325 MG tablet Take 1-2 tablets by mouth every 4 (four) hours as needed for moderate pain or severe pain. 20 tablet 0  . SUMAtriptan (IMITREX) 100 MG tablet Take 1 tablet earliest onset of migraine.  May repeat in 2 hours if headache persists or recurs.  Maximum 2 tablets in 24hrs 10 tablet 3  . topiramate (TOPAMAX) 100 MG tablet Take 1 tablet (100 mg total) by mouth at bedtime. 30 tablet 3  . TRINTELLIX 5 MG TABS tablet Take 5 mg by mouth daily.    . vitamin B-12 1000 MCG tablet Take 1 tablet (1,000 mcg total)  by mouth daily. 30 tablet 0  . clonazePAM (KLONOPIN) 0.5 MG tablet Take 1 tablet (0.5 mg total) by mouth daily as needed for anxiety. 30 tablet 2  . zolpidem (AMBIEN) 5 MG tablet Take 1 tablet (5 mg total) by mouth at bedtime as needed for sleep. (Patient taking differently: Take 5 mg by mouth at bedtime as needed for sleep. Has not started) 30 tablet 2   No current  facility-administered medications on file prior to visit.      Past Medical History:  Diagnosis Date  . Anxiety   . Asthma    childhood exercise induced only  . 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 Cholecystitis chronic, acute   . Depression   . Gallstones   . Headache(784.0)    migraines  . Heart murmur    no indications for 7-15yrs per pt.   No Known Allergies  Social History   Socioeconomic History  . Marital status: Single    Spouse name: Not on file  . Number of children: 3  . Years of education: Not on file  . Highest education level: Associate degree: academic program  Occupational History  . Occupation: [employer]  Social Needs  . Financial resource strain: Not on file  . Food insecurity    Worry: Not on file    Inability: Not on file  . Transportation needs    Medical: Not on file    Non-medical: Not on file  Tobacco Use  . Smoking status: Never Smoker  . Smokeless tobacco: Never Used  Substance and Sexual Activity  . Alcohol use: No  . Drug use: No  . Sexual activity: Yes    Partners: Male    Birth control/protection: Pill  Lifestyle  . Physical activity    Days per week: Not on file    Minutes per session: Not on file  . Stress: Not on file  Relationships  . Social Herbalist on phone: Not on file    Gets together: Not on file    Attends religious service: Not on file    Active member of club or organization: Not on file    Attends meetings of clubs or organizations: Not on file    Relationship status: Not on file  Other Topics Concern  . Not on file  Social History Narrative   Single, lives with significant other [name] [name])   #3 children: ages 13-11-2 1/2 months   Employer: [name]   Independent ADLs      Patient is right-handed. She lives with her boy friend.    Vitals:   01/12/19 0928  BP: 120/82  Pulse: 88  Resp: 12  Temp: 97.8 F (36.6 C)  SpO2: 96%   Body mass index is 48.43 kg/m.   Physical Exam  Nursing note and vitals  reviewed. Constitutional: She is oriented to person, place, and time. She appears well-developed. No distress.  HENT:  Head: Normocephalic and atraumatic.  Mouth/Throat: Oropharynx is clear and moist and mucous membranes are normal.  Eyes: Pupils are equal, round, and reactive to light. Conjunctivae are normal.  Cardiovascular: Normal rate and regular rhythm.  No murmur heard. Pulses:      Dorsalis pedis pulses are 2+ on the right side and 2+ on the left side.  Respiratory: Effort normal and breath sounds normal. No respiratory distress.  GI: Soft. She exhibits no mass. There is no hepatomegaly. There is no abdominal tenderness.  Musculoskeletal:  General: No edema.  Lymphadenopathy:    She has no cervical adenopathy.  Neurological: She is alert and oriented to person, place, and time. She has normal strength. No cranial nerve deficit. Gait normal.  Skin: Skin is warm. No rash noted. No erythema.  Psychiatric: She has a normal mood and affect.  Well groomed, good eye contact.   ASSESSMENT AND PLAN:  Ms. Hibah was seen today for follow-up.  Diagnoses and all orders for this visit:  B12 deficiency No changes in current management, will follow B12 done today and will give further recommendations accordingly.  -     Vitamin B12  Vitamin D deficiency, unspecified She has not been taking Vit D supplementation. Further recommendations will be given according to 25 OH vit D results.  -     VITAMIN D 25 Hydroxy (Vit-D Deficiency, Fractures)  GAD (generalized anxiety disorder) Better controlled. Continue following with psychiatrist.  High grade squamous intraepithelial lesion (HGSIL) on cytologic smear of cervix She has not had colpo Bx. Strongly recommend calling her gyn to arrange appt.  Encounter for initial prescription of contraceptive pills Tolerating well. No changes today. F/U in a year.  -     norgestimate-ethinyl estradiol (SPRINTEC 28) 0.25-35 MG-MCG  tablet; Take 1 tablet by mouth daily.  Chronic migraine without aura without status migrainosus, not intractable Improved. Taking Topamax 100 mg daily. Continue following with Dr Charlene Brooke.   Return in about 1 year (around 01/12/2020) for cpe.  -Ms. Sheenah Dimitroff was advised to return sooner than planned today if new concerns arise.    Laketra Bowdish G. Martinique, MD  [facility]. [place] office.

## 2019-01-12 NOTE — Patient Instructions (Signed)
A few things to remember from today's visit:   B12 deficiency - Plan: Vitamin B12  Vitamin D deficiency, unspecified - Plan: VITAMIN D 25 Hydroxy (Vit-D Deficiency, Fractures)  GAD (generalized anxiety disorder)  High grade squamous intraepithelial lesion (HGSIL) on cytologic smear of cervix  Please call your gynecologist as soon as possible, you really need to have a biopsy of your cervix. Continue following with your psychiatrist.  I am glad your migraine headaches are much better, continue following with neurologist.  I think I can continue seeing you for your CPE annually, before if needed.   Please be sure medication list is accurate. If a new problem present, please set up appointment sooner than planned today.

## 2019-01-14 ENCOUNTER — Encounter: Payer: Self-pay | Admitting: Family Medicine

## 2019-01-22 ENCOUNTER — Telehealth: Payer: Self-pay | Admitting: Family Medicine

## 2019-01-22 NOTE — Telephone Encounter (Signed)
Attempted to reach patient about changes in the office. Left a message for her to call the office.

## 2019-01-22 NOTE — Telephone Encounter (Signed)
Called patient to inform her of the emergency we have in the office that entailed her needing to change her appointment with Dr Nehemiah Settle. She has questions about her appointment. She has concerns about  Her appointment getting pushed back, and will see anyone.

## 2019-02-02 ENCOUNTER — Ambulatory Visit: Payer: Medicaid Other | Admitting: Family Medicine

## 2019-02-02 NOTE — Progress Notes (Signed)
NEUROLOGY FOLLOW UP OFFICE NOTE  [name] [name] [id number]  HISTORY OF PRESENT ILLNESS: [name] [name] is a 38 year old female with anxiety, depression, insomnia and migraines and history of carcinoid tumor of appendix who follows up for migraines.  UPDATE: Intensity:  Moderate to severe Duration:  Improves after an hour of taking sumatriptan and lasts 3 to 4 hours.   Frequency:  3 days a month (1 severe) Frequency of abortive medication: 3 times Current NSAIDS:  none Current analgesics:  Tylenol, Current triptans:  sumatriptan 100mg  Current ergotamine:  none Current anti-emetic:  Zofran ODT 4mg  Current muscle relaxants:  baclofen Current anti-anxiolytic:  clonazepam Current sleep aide:  none Current Antihypertensive medications:  none Current Antidepressant medications:  Trintellix Current Anticonvulsant medications:  Topirmate 100mg  at bedtime Current anti-CGRP:  none Current Vitamins/Herbal/Supplements:  B12 Current Antihistamines/Decongestants:  none Other therapy:  none Hormone/birth control:  Sprintec (at least a little over a year, no change in headaches)  Caffeine:  No coffee.  Cut back on soda and sweet tea (12 oz a day). Alcohol:  Rarely (once every 6 months) Diet:  Drinks Coke or Sprite but has cut back to 12 oz a day.  Increased water intake (64 oz of water) Exercise:  No Depression:  stable; Anxiety:  some Other pain:  no Sleep hygiene:  Poor.  Difficulty falling asleep  HISTORY: Onset:  Around puberty.  Gradually getting worse over the years.  She was seen in the Urgent Care on 09/12/18 for an intractable migraine that didn't respond to OTC or oxycodone that lasted a week, where she received a headache cocktail of Toradol 30mg , Compazine 10mg  and Benadryl 25mg  which took the edge off but did not break it.   Location:  Left frontal and temporal region as well as a tenderness on the back of her head. Quality:  stabbing Initial intensity:  6-8/10.  She denies  new headache, thunderclap headache Aura:  no Premonitory Phase:  no Postdrome:  Feels off-kilter Associated symptoms:  Nausea, photophobia, phonophobia, sometimes vomiting.  She denies associated visual disturbance, autonomic symptoms or unilateral numbness or weakness. Initial Duration:  Usually last 2 days Initial Frequency:  Severe migraines occurs 2-3 days prior to onset of menses.  More moderate headaches occur in between.  5 days a month. Initial Frequency of abortive medication: 5 days a month Triggers:  Hormonal, emotional stress, lack of sleep Relieving factors: Cool wash cloth, lay down Activity:  Aggravates  Past NSAIDS:  Ibuprofen, naproxen Past analgesics:  Excedrin Migraine Past abortive triptans:   Past abortive ergotamine:  none Past muscle relaxants:  Flexeril Past anti-emetic:  promethazine Past antihypertensive medications:  none Past antidepressant medications:  Amitriptyline, Effexor XR 75mg , Celexa, Prozac Past anticonvulsant medications:  none Past anti-CGRP:  none Past vitamins/Herbal/Supplements:  none Past antihistamines/decongestants:  none Other past therapies:  none  Family history of headache:  Mother, brother  PAST MEDICAL HISTORY: Past Medical History:  Diagnosis Date  . Anxiety   . Asthma    childhood exercise induced only  . Cancer ([place])    appendix  . Cholecystitis chronic, acute   . Depression   . Gallstones   . Headache(784.0)    migraines  . Heart murmur    no indications for 7-70yrs per pt.    MEDICATIONS: Current Outpatient Medications on File Prior to Visit  Medication Sig Dispense Refill  . baclofen (LIORESAL) 10 MG tablet Take 1 tablet (10 mg total) by mouth 3 (three) times daily. 30 each  0  . clonazePAM (KLONOPIN) 0.5 MG tablet Take 1 tablet (0.5 mg total) by mouth daily as needed for anxiety. 30 tablet 2  . ibuprofen (ADVIL,MOTRIN) 200 MG tablet Take 400 mg by mouth every 6 (six) hours as needed for headache or moderate  pain.    . magnesium oxide (MAG-OX) 400 (241.3 Mg) MG tablet Take 1 tablet (400 mg total) by mouth 2 (two) times daily. 4 tablet 0  . norgestimate-ethinyl estradiol (SPRINTEC 28) 0.25-35 MG-MCG tablet Take 1 tablet by mouth daily. 84 tablet 3  . ondansetron (ZOFRAN ODT) 4 MG disintegrating tablet Take 1 tablet (4 mg total) by mouth every 8 (eight) hours as needed for nausea or vomiting. 20 tablet 0  . oxyCODONE-acetaminophen (PERCOCET/ROXICET) 5-325 MG tablet Take 1-2 tablets by mouth every 4 (four) hours as needed for moderate pain or severe pain. 20 tablet 0  . SUMAtriptan (IMITREX) 100 MG tablet Take 1 tablet earliest onset of migraine.  May repeat in 2 hours if headache persists or recurs.  Maximum 2 tablets in 24hrs 10 tablet 3  . topiramate (TOPAMAX) 100 MG tablet Take 1 tablet (100 mg total) by mouth at bedtime. 30 tablet 3  . TRINTELLIX 5 MG TABS tablet Take 5 mg by mouth daily.    . vitamin B-12 1000 MCG tablet Take 1 tablet (1,000 mcg total) by mouth daily. 30 tablet 0  . Vitamin D, Ergocalciferol, (DRISDOL) 1.25 MG (50000 UT) CAPS capsule 1 caps weekly x 8 and then continue q 2 weeks. 12 capsule 0  . zolpidem (AMBIEN) 5 MG tablet Take 1 tablet (5 mg total) by mouth at bedtime as needed for sleep. (Patient taking differently: Take 5 mg by mouth at bedtime as needed for sleep. Has not started) 30 tablet 2   No current facility-administered medications on file prior to visit.     ALLERGIES: No Known Allergies  FAMILY HISTORY: Family History  Problem Relation Age of Onset  . Cancer Father        Leukemia  . ADD / ADHD Other        family hx  . Breast cancer Other        fhx  . Mental illness Other        fhx  . Heart disease Other        fhx  . Hypertension Other        fhx  . Heart defect Brother     SOCIAL HISTORY: Social History   Socioeconomic History  . Marital status: Single    Spouse name: Not on file  . Number of children: 3  . Years of education: Not on file   . Highest education level: Associate degree: academic program  Occupational History  . Occupation: [employer]  Social Needs  . Financial resource strain: Not on file  . Food insecurity    Worry: Not on file    Inability: Not on file  . Transportation needs    Medical: Not on file    Non-medical: Not on file  Tobacco Use  . Smoking status: Never Smoker  . Smokeless tobacco: Never Used  Substance and Sexual Activity  . Alcohol use: No  . Drug use: No  . Sexual activity: Yes    Partners: Male    Birth control/protection: Pill  Lifestyle  . Physical activity    Days per week: Not on file    Minutes per session: Not on file  . Stress: Not on file  Relationships  .  Social Herbalist on phone: Not on file    Gets together: Not on file    Attends religious service: Not on file    Active member of club or organization: Not on file    Attends meetings of clubs or organizations: Not on file    Relationship status: Not on file  . Intimate partner violence    Fear of current or ex partner: Not on file    Emotionally abused: Not on file    Physically abused: Not on file    Forced sexual activity: Not on file  Other Topics Concern  . Not on file  Social History Narrative   Single, lives with significant other [name])   #3 children: ages 13-11-2 1/2 months   Employer: [name]   Independent ADLs      Patient is right-handed. She lives with her boy friend.    REVIEW OF SYSTEMS: Constitutional: No fevers, chills, or sweats, no generalized fatigue, change in appetite Eyes: No visual changes, double vision, eye pain Ear, nose and throat: No hearing loss, ear pain, nasal congestion, sore throat Cardiovascular: No chest pain, palpitations Respiratory:  No shortness of breath at rest or with exertion, wheezes GastrointestinaI: No nausea, vomiting, diarrhea, abdominal pain, fecal incontinence Genitourinary:  No dysuria, urinary retention or frequency Musculoskeletal:  No neck  pain, back pain Integumentary: No rash, pruritus, skin lesions Neurological: as above Psychiatric: No depression, insomnia, anxiety Endocrine: No palpitations, fatigue, diaphoresis, mood swings, change in appetite, change in weight, increased thirst Hematologic/Lymphatic:  No purpura, petechiae. Allergic/Immunologic: no itchy/runny eyes, nasal congestion, recent allergic reactions, rashes  PHYSICAL EXAM: Blood pressure (!) 146/81, pulse 69, temperature (!) 97.3 F (36.3 C), height 5\' 4"  (1.626 m), weight 278 lb (126.1 kg), last menstrual period 01/27/2019, SpO2 98 %. General: No acute distress.  Patient appears well-groomed.   Head:  Normocephalic/atraumatic Eyes:  Fundi examined but not visualized Neck: supple, no paraspinal tenderness, full range of motion Heart:  Regular rate and rhythm Lungs:  Clear to auscultation bilaterally Back: No paraspinal tenderness Neurological Exam: alert and oriented to person, place, and time. Attention span and concentration intact, recent and remote memory intact, fund of knowledge intact.  Speech fluent and not dysarthric, language intact.  CN II-XII intact. Bulk and tone normal, muscle strength 5/5 throughout.  Sensation to light touch  intact.  Deep tendon reflexes 2+ throughout.  Finger to nose testing intact.  Gait normal, Romberg negative.  IMPRESSION: Menstrually related migraine, without status migrainosus, not intractable  PLAN: 1.  For preventative management, topiramate 100mg  at bedtime 2.  For abortive therapy, sumatriptan 100mg .  Advised that she can repeat dose in 2 hours or take with naproxen 3.  Limit use of pain relievers to no more than 2 days out of week to prevent risk of rebound or medication-overuse headache. 4.  Keep headache diary 5.  Exercise, hydration, caffeine cessation, sleep hygiene, monitor for and avoid triggers 6.  Consider:  magnesium citrate 400mg  daily, riboflavin 400mg  daily, and coenzyme Q10 100mg  three times daily  7. Always keep in mind that currently taking a hormone or birth control may be a possible trigger or aggravating factor for migraine. 8. Follow up in 6 months.  Metta Clines, DO  CC: Betty Martinique, MD

## 2019-02-03 ENCOUNTER — Encounter: Payer: Self-pay | Admitting: Neurology

## 2019-02-03 ENCOUNTER — Other Ambulatory Visit: Payer: Self-pay

## 2019-02-03 ENCOUNTER — Ambulatory Visit (INDEPENDENT_AMBULATORY_CARE_PROVIDER_SITE_OTHER): Payer: Medicaid Other | Admitting: Neurology

## 2019-02-03 VITALS — BP 146/81 | HR 69 | Temp 97.3°F | Ht 64.0 in | Wt 278.0 lb

## 2019-02-03 DIAGNOSIS — G43829 Menstrual migraine, not intractable, without status migrainosus: Secondary | ICD-10-CM | POA: Diagnosis not present

## 2019-02-03 NOTE — Patient Instructions (Signed)
1.  Continue topiramate 100mg  at bedtime 2.  Take sumatriptan with Aleve 440mg  at earliest onset of migraine.  May repeat in 2 hours if needed. 3.  Limit use of pain relievers to no more than 2 days out of week to prevent risk of rebound or medication-overuse headache. 4.  Follow up in 6 months.

## 2019-02-23 ENCOUNTER — Ambulatory Visit (INDEPENDENT_AMBULATORY_CARE_PROVIDER_SITE_OTHER): Payer: Medicaid Other | Admitting: Family Medicine

## 2019-02-23 ENCOUNTER — Other Ambulatory Visit (HOSPITAL_COMMUNITY)
Admission: RE | Admit: 2019-02-23 | Discharge: 2019-02-23 | Disposition: A | Discharge status: Home / Self Care | Payer: Medicaid Other | Source: Ambulatory Visit | Attending: Family Medicine | Admitting: Family Medicine

## 2019-02-23 ENCOUNTER — Other Ambulatory Visit: Payer: Self-pay

## 2019-02-23 VITALS — BP 131/92 | HR 95 | Temp 98.1°F | Wt 278.8 lb

## 2019-02-23 DIAGNOSIS — D069 Carcinoma in situ of cervix, unspecified: Secondary | ICD-10-CM | POA: Insufficient documentation

## 2019-02-23 NOTE — Progress Notes (Signed)
   Subjective:    Patient ID: [name] [name], female    DOB: Jul 16, 1980, 38 y.o.   MRN: [id number]  HPI Patient here for repeat PAP for surveillence of CIN3 after LEEP with focal involvement of endocervical margin. LEEP was done 11/2017. Was supposed to return for rpt PAP and ECC 4-6 month after procedure.    Review of Systems     Objective:   Physical Exam Vitals signs reviewed. Exam conducted with a chaperone present.  Constitutional:      Appearance: Normal appearance.  Abdominal:     Hernia: There is no hernia in the left inguinal area or right inguinal area.  Genitourinary:    Labia:        Right: No rash, tenderness or lesion.        Left: No rash, tenderness or lesion.      Vagina: Normal.     Cervix: No cervical motion tenderness, discharge, friability, lesion, erythema, cervical bleeding or eversion.  Lymphadenopathy:     Lower Body: No right inguinal adenopathy. No left inguinal adenopathy.  Neurological:     Mental Status: She is alert.        Assessment & Plan:  1. CIN III with severe dysplasia PAP done with brush ECC. Will follow results

## 2019-02-26 NOTE — Addendum Note (Signed)
Addended by: Langston Reusing on: 02/26/2019 12:47 PM   Modules accepted: Orders

## 2019-02-27 ENCOUNTER — Telehealth: Payer: Self-pay | Admitting: *Deleted

## 2019-02-27 NOTE — Telephone Encounter (Signed)
Called pt and informed her that there has been a problem and one of the specimens obtained during her visit on 9/21 is going to need to be re-collected. She will not be charged for the visit. Pt voiced understanding and agreed to appt on 9/28 @ 3:15pm. Pt stated she is very anxious due to things she had been told by her PCP that she was "putting her life on the line". I offered support and encouraged her to discuss with Dr. Nehemiah Settle further for clarity during the visit on 9/28. Pt became tearful and voiced understanding.

## 2019-03-02 ENCOUNTER — Other Ambulatory Visit: Payer: Self-pay

## 2019-03-02 ENCOUNTER — Ambulatory Visit: Payer: Medicaid Other | Admitting: Family Medicine

## 2019-03-02 ENCOUNTER — Other Ambulatory Visit (HOSPITAL_COMMUNITY)
Admission: RE | Admit: 2019-03-02 | Discharge: 2019-03-02 | Disposition: A | Discharge status: Home / Self Care | Payer: Medicaid Other | Source: Ambulatory Visit | Attending: Family Medicine | Admitting: Family Medicine

## 2019-03-02 VITALS — BP 140/80 | HR 91 | Temp 98.7°F | Wt 279.0 lb

## 2019-03-02 DIAGNOSIS — D069 Carcinoma in situ of cervix, unspecified: Secondary | ICD-10-CM | POA: Diagnosis not present

## 2019-03-02 NOTE — Progress Notes (Signed)
ECC recollected as sent in wrong sample.

## 2019-03-04 LAB — SURGICAL PATHOLOGY

## 2019-03-05 LAB — CYTOLOGY - PAP
Adequacy: ABSENT
Diagnosis: NEGATIVE
High risk HPV: NEGATIVE
Molecular Disclaimer: 56
Molecular Disclaimer: NORMAL

## 2019-03-19 DIAGNOSIS — Z23 Encounter for immunization: Secondary | ICD-10-CM | POA: Diagnosis not present

## 2019-04-22 ENCOUNTER — Other Ambulatory Visit: Payer: Self-pay | Admitting: Neurology

## 2019-05-11 ENCOUNTER — Telehealth (INDEPENDENT_AMBULATORY_CARE_PROVIDER_SITE_OTHER): Payer: Medicaid Other | Admitting: Family Medicine

## 2019-05-11 ENCOUNTER — Encounter: Payer: Self-pay | Admitting: Family Medicine

## 2019-05-11 VITALS — Ht 64.0 in

## 2019-05-11 DIAGNOSIS — H6981 Other specified disorders of Eustachian tube, right ear: Secondary | ICD-10-CM

## 2019-05-11 DIAGNOSIS — R059 Cough, unspecified: Secondary | ICD-10-CM

## 2019-05-11 DIAGNOSIS — R05 Cough: Secondary | ICD-10-CM

## 2019-05-11 DIAGNOSIS — J309 Allergic rhinitis, unspecified: Secondary | ICD-10-CM

## 2019-05-11 MED ORDER — FLUTICASONE PROPIONATE 50 MCG/ACT NA SUSP: 1.0000 | Freq: Two times a day (BID) | NASAL | 3 refills | Status: DC

## 2019-05-11 NOTE — Progress Notes (Signed)
Virtual Visit via Video Note   I connected with [name] [name] [name] on 05/11/19 by a video enabled telemedicine application and verified that I am speaking with the correct person using two identifiers.  Location patient: home Location provider:work office Persons participating in the virtual visit: patient, provider  I discussed the limitations of evaluation and management by telemedicine and the availability of in person appointments. The patient expressed understanding and agreed to proceed.   HPI: [name] [name] is a 38 yo female c/o right-sided nasal congestion for a week.  Right ear feels "stopped up", constant. She has tried to remove cerumen with peroxide, but it is not helping. Denies tinnitus, ear drainage, or ear ache.  Also complaining of a nonproductive cough that she has had for months.  Problem is exacerbated by sudden temperature changes from cold to hot/warm environment and vise versa. Negative for sick contact.  Denies fever, chills, unusual headache, sore throat, changes in the smell or taste, wheezing, dyspnea, abdominal pain, diarrhea, nausea, vomiting, or skin rash. History of migraines, stable.  She has taking OTC Advil sinus and cold and Xyzal.  Negative for heartburn or acid reflux.  ROS: See pertinent positives and negatives per HPI.  Past Medical History:  Diagnosis Date  . Anxiety   . Asthma    childhood exercise induced only  . 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 Cholecystitis chronic, acute   . Depression   . Gallstones   . Headache(784.0)    migraines  . Heart murmur    no indications for 7-76yrs per pt.    Past Surgical History:  Procedure Laterality Date  . APPENDECTOMY    . CESAREAN SECTION N/A 11/03/2012   Procedure: PRIMARY CESAREAN SECTION;  Surgeon: Melina Schools, MD;  Location: [facility];  Service: Obstetrics;  Laterality: N/A;  . CHOLECYSTECTOMY N/A 12/23/2012   Procedure: LAPAROSCOPIC CHOLECYSTECTOMY WITH INTRAOPERATIVE CHOLANGIOGRAM;  Surgeon: Pedro Earls, MD;  Location: [facility];  Service: General;  Laterality: N/A;  Laparoscopic Cholecystectomy with IOC  . COLON SURGERY    . ERCP N/A 12/22/2012   Procedure: ENDOSCOPIC RETROGRADE CHOLANGIOPANCREATOGRAPHY (ERCP);  Surgeon: Gatha Mayer, MD;  Location: [facility];  Service: Endoscopy;  Laterality: N/A;  . INSERTION OF MESH N/A 07/15/2017   Procedure: INSERTION OF MESH;  Surgeon: Jovita Kussmaul, MD;  Location: [facility];  Service: General;  Laterality: N/A;  . LAPAROTOMY N/A 11/08/2012   Procedure: laparoscopic exploration and EXPLORATORY LAPAROTOMY;  Surgeon: Merrie Roof, MD;  Location: [facility];  Service: General;  Laterality: N/A;  . VENTRAL HERNIA REPAIR N/A 07/15/2017   Procedure: LAPAROSCOPIC ASSISTED VENTRAL HERNIA REPAIR;  Surgeon: Jovita Kussmaul, MD;  Location: [facility];  Service: General;  Laterality: N/A;    Family History  Problem Relation Age of Onset  . Cancer Father        Leukemia  . ADD / ADHD Other        family hx  . Breast cancer Other        fhx  . Mental illness Other        fhx  . Heart disease Other        fhx  . Hypertension Other        fhx  . Heart defect Brother     Social History   Socioeconomic History  . Marital status: Single    Spouse name: Not on file  . Number of children: 3  . Years of education: Not  on file  . Highest education level: Associate degree: academic program  Occupational History  . Occupation: [employer]  Social Needs  . Financial resource strain: Not on file  . Food insecurity    Worry: Not on file    Inability: Not on file  . Transportation needs    Medical: Not on file    Non-medical: Not on file  Tobacco Use  . Smoking status: Never Smoker  . Smokeless tobacco: Never Used  Substance and Sexual Activity  . Alcohol use: Yes    Comment: occassional  . Drug use: No  . Sexual activity: Yes    Partners: Male    Birth control/protection: Pill  Lifestyle  . Physical activity    Days per week: Not on file    Minutes per  session: Not on file  . Stress: Not on file  Relationships  . Social Herbalist on phone: Not on file    Gets together: Not on file    Attends religious service: Not on file    Active member of club or organization: Not on file    Attends meetings of clubs or organizations: Not on file    Relationship status: Not on file  . Intimate partner violence    Fear of current or ex partner: Not on file    Emotionally abused: Not on file    Physically abused: Not on file    Forced sexual activity: Not on file  Other Topics Concern  . Not on file  Social History Narrative   Single, lives with significant other [name] [name])   #3 children: ages 13-11-2 1/2 months   Employer: [name]   Independent ADLs      Patient is right-handed. She lives with her boy friend.   One level home    Caffeine - 20 oz of soda or tea a day    Current Outpatient Medications:  .  ibuprofen (ADVIL,MOTRIN) 200 MG tablet, Take 400 mg by mouth every 6 (six) hours as needed for headache or moderate pain., Disp: , Rfl:  .  magnesium oxide (MAG-OX) 400 (241.3 Mg) MG tablet, Take 1 tablet (400 mg total) by mouth 2 (two) times daily., Disp: 4 tablet, Rfl: 0 .  norgestimate-ethinyl estradiol (SPRINTEC 28) 0.25-35 MG-MCG tablet, Take 1 tablet by mouth daily., Disp: 84 tablet, Rfl: 3 .  ondansetron (ZOFRAN ODT) 4 MG disintegrating tablet, Take 1 tablet (4 mg total) by mouth every 8 (eight) hours as needed for nausea or vomiting., Disp: 20 tablet, Rfl: 0 .  SUMAtriptan (IMITREX) 100 MG tablet, Take 1 tablet earliest onset of migraine.  May repeat in 2 hours if headache persists or recurs.  Maximum 2 tablets in 24hrs, Disp: 10 tablet, Rfl: 3 .  topiramate (TOPAMAX) 100 MG tablet, TAKE 1 TABLET BY MOUTH AT BEDTIME, Disp: 90 tablet, Rfl: 3 .  TRINTELLIX 5 MG TABS tablet, Take 5 mg by mouth daily., Disp: , Rfl:  .  vitamin B-12 1000 MCG tablet, Take 1 tablet (1,000 mcg total) by mouth daily., Disp: 30 tablet, Rfl: 0 .   Vitamin D, Ergocalciferol, (DRISDOL) 1.25 MG (50000 UT) CAPS capsule, 1 caps weekly x 8 and then continue q 2 weeks., Disp: 12 capsule, Rfl: 0 .  clonazePAM (KLONOPIN) 0.5 MG tablet, Take 1 tablet (0.5 mg total) by mouth daily as needed for anxiety., Disp: 30 tablet, Rfl: 2 .  fluticasone (FLONASE) 50 MCG/ACT nasal spray, Place 1 spray into both nostrils 2 (two) times  daily., Disp: 16 g, Rfl: 3 .  zolpidem (AMBIEN) 5 MG tablet, Take 1 tablet (5 mg total) by mouth at bedtime as needed for sleep. (Patient taking differently: Take 5 mg by mouth at bedtime as needed for sleep. Has not started), Disp: 30 tablet, Rfl: 2  EXAM:  VITALS per patient if applicable:Ht 5\' 4"  (1.626 m)   BMI 47.89 kg/m   GENERAL: alert, oriented, appears well and in no acute distress  HEENT: atraumatic, conjunttiva clear, no obvious abnormalities on inspection of external nose and ears Asked to do auto inflation maneuver during visit,she could not "pop" right ear.  NECK: normal movements of the head and neck  LUNGS: on inspection no signs of respiratory distress, breathing rate appears normal, no obvious gross SOB, gasping or wheezing  CV: no obvious cyanosis  PSYCH/NEURO: pleasant and cooperative, no obvious depression or anxiety, speech and thought processing grossly intact  ASSESSMENT AND PLAN:  Discussed the following assessment and plan:  Dysfunction of right eustachian tube We discussed diagnosis, prognosis, and treatment options. Recommend intranasal steroids. Continue auto inflation maneuvers a few times during the day. Instructed about warning signs.  Allergic rhinitis, unspecified seasonality, unspecified trigger For now recommend holding on OTC antihistaminics. Flonase nasal spray 1 to 2 sprays in right nostril daily. Nasal saline irrigations as needed.  Recommend monitoring for new associated symptoms.  Cough Persistent for the past month. We discussed possible etiologies of persistent  cough, including allergies and GERD. For now I do not think imaging is going to be necessary. Intranasal Flonase may help. Instructed about warning signs.    I discussed the assessment and treatment plan with the patient. She was provided an opportunity to ask questions and all were answered. She agreed with the plan and demonstrated an understanding of the instructions.   The patient was advised to call back or seek an in-person evaluation if the symptoms worsen or if the condition fails to improve as anticipated.  Return if symptoms worsen or fail to improve.    Berniece Abid Martinique, MD

## 2019-05-18 ENCOUNTER — Ambulatory Visit (INDEPENDENT_AMBULATORY_CARE_PROVIDER_SITE_OTHER): Payer: Medicaid Other | Admitting: Family Medicine

## 2019-05-18 ENCOUNTER — Encounter: Payer: Self-pay | Admitting: Family Medicine

## 2019-05-18 VITALS — BP 126/80 | HR 88 | Resp 12 | Ht 64.0 in | Wt 282.0 lb

## 2019-05-18 DIAGNOSIS — Z6841 Body Mass Index (BMI) 40.0 and over, adult: Secondary | ICD-10-CM | POA: Diagnosis not present

## 2019-05-18 DIAGNOSIS — H60501 Unspecified acute noninfective otitis externa, right ear: Secondary | ICD-10-CM | POA: Diagnosis not present

## 2019-05-18 DIAGNOSIS — H6981 Other specified disorders of Eustachian tube, right ear: Secondary | ICD-10-CM | POA: Diagnosis not present

## 2019-05-18 DIAGNOSIS — E559 Vitamin D deficiency, unspecified: Secondary | ICD-10-CM

## 2019-05-18 DIAGNOSIS — E538 Deficiency of other specified B group vitamins: Secondary | ICD-10-CM | POA: Diagnosis not present

## 2019-05-18 LAB — VITAMIN B12: Vitamin B-12: 325 pg/mL (ref 211–911)

## 2019-05-18 LAB — VITAMIN D 25 HYDROXY (VIT D DEFICIENCY, FRACTURES): VITD: 23.6 ng/mL — ABNORMAL LOW (ref 30.00–100.00)

## 2019-05-18 MED ORDER — NEOMYCIN-POLYMYXIN-HC 3.5-10000-1 OT SOLN: 3.0000 [drp] | Freq: Four times a day (QID) | OTIC | 0 refills | Status: DC

## 2019-05-18 NOTE — Progress Notes (Signed)
HPI:   [name] is a 38 y.o. female, who is here today for chronic disease management.   She was last seen on 05/11/19 for acute virtual visit c/o respiratory symptoms more suggestive of allergies rather than infectious process.   Still having nasal congestion and rhinorrhea. Cough is gone and not longer having the sensation of "hair ball" in her throat. Negative for sore throat,dysphagia,wheezing,and dyspnea.  Still having right earache. She has not noted ear drainage, +fullnesss sensation,no hearing loss. Right neck pain has improved. Negative for chills,fever,unusual fatigue or body aches.  She is using Flonase nasal spray.  Vit D deficiency: Last dose of Ergocalciferol 50,000 U on 05/02/19. 25 OH vit D 12.65 in 01/2019.  B12 deficiency:  Lab Results  Component Value Date   VITAMINB12 221 01/12/2019   She is on daily B12 1000 mcg daily.   Review of Systems  Constitutional: Negative for activity change and appetite change.  HENT: Negative for facial swelling, mouth sores and nosebleeds.   Eyes: Negative for discharge and itching.  Cardiovascular: Negative for chest pain, palpitations and leg swelling.  Gastrointestinal: Negative for abdominal pain, nausea and vomiting.       Negative for changes in bowel habits.  Allergic/Immunologic: Positive for environmental allergies.  Neurological: Negative for syncope, weakness and headaches.  Rest of ROS, see pertinent positives sand negatives in HPI   Current Outpatient Medications on File Prior to Visit  Medication Sig Dispense Refill  . fluticasone (FLONASE) 50 MCG/ACT nasal spray Place 1 spray into both nostrils 2 (two) times daily. 16 g 3  . ibuprofen (ADVIL,MOTRIN) 200 MG tablet Take 400 mg by mouth every 6 (six) hours as needed for headache or moderate pain.    . magnesium oxide (MAG-OX) 400 (241.3 Mg) MG tablet Take 1 tablet (400 mg total) by mouth 2 (two) times daily. 4 tablet 0  .  norgestimate-ethinyl estradiol (SPRINTEC 28) 0.25-35 MG-MCG tablet Take 1 tablet by mouth daily. 84 tablet 3  . ondansetron (ZOFRAN ODT) 4 MG disintegrating tablet Take 1 tablet (4 mg total) by mouth every 8 (eight) hours as needed for nausea or vomiting. 20 tablet 0  . SUMAtriptan (IMITREX) 100 MG tablet Take 1 tablet earliest onset of migraine.  May repeat in 2 hours if headache persists or recurs.  Maximum 2 tablets in 24hrs 10 tablet 3  . topiramate (TOPAMAX) 100 MG tablet TAKE 1 TABLET BY MOUTH AT BEDTIME 90 tablet 3  . TRINTELLIX 5 MG TABS tablet Take 5 mg by mouth daily.    . vitamin B-12 1000 MCG tablet Take 1 tablet (1,000 mcg total) by mouth daily. 30 tablet 0  . Vitamin D, Ergocalciferol, (DRISDOL) 1.25 MG (50000 UT) CAPS capsule 1 caps weekly x 8 and then continue q 2 weeks. 12 capsule 0  . clonazePAM (KLONOPIN) 0.5 MG tablet Take 1 tablet (0.5 mg total) by mouth daily as needed for anxiety. 30 tablet 2  . zolpidem (AMBIEN) 5 MG tablet Take 1 tablet (5 mg total) by mouth at bedtime as needed for sleep. (Patient taking differently: Take 5 mg by mouth at bedtime as needed for sleep. Has not started) 30 tablet 2   No current facility-administered medications on file prior to visit.     Past Medical History:  Diagnosis Date  . Anxiety   . Asthma    childhood exercise induced only  . 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 Cholecystitis chronic, acute   .  Depression   . Gallstones   . Headache(784.0)    migraines  . Heart murmur    no indications for 7-15yrs per pt.   No Known Allergies  Social History   Socioeconomic History  . Marital status: Single    Spouse name: Not on file  . Number of children: 3  . Years of education: Not on file  . Highest education level: Associate degree: academic program  Occupational History  . Occupation: [employer]  Tobacco Use  . Smoking status: Never Smoker  . Smokeless tobacco: Never Used  Substance and Sexual Activity  . Alcohol use: Yes     Comment: occassional  . Drug use: No  . Sexual activity: Yes    Partners: Male    Birth control/protection: Pill  Other Topics Concern  . Not on file  Social History Narrative   Single, lives with significant other [name])   #3 children: ages 13-11-2 1/2 months   Employer: [name]   Independent ADLs      Patient is right-handed. She lives with her boy friend.   One level home    Caffeine - 20 oz of soda or tea a day   Social Determinants of Health   Financial Resource Strain:   . Difficulty of Paying Living Expenses: Not on file  Food Insecurity:   . Worried About Charity fundraiser in the Last Year: Not on file  . Ran Out of Food in the Last Year: Not on file  Transportation Needs:   . Lack of Transportation (Medical): Not on file  . Lack of Transportation (Non-Medical): Not on file  Physical Activity:   . Days of Exercise per Week: Not on file  . Minutes of Exercise per Session: Not on file  Stress:   . Feeling of Stress : Not on file  Social Connections:   . Frequency of Communication with Friends and Family: Not on file  . Frequency of Social Gatherings with Friends and Family: Not on file  . Attends Religious Services: Not on file  . Active Member of Clubs or Organizations: Not on file  . Attends Archivist Meetings: Not on file  . Marital Status: Not on file    Vitals:   05/18/19 0826  BP: 126/80  Pulse: 88  Resp: 12  SpO2: 98%   Body mass index is 48.41 kg/m.  Wt Readings from Last 3 Encounters:  05/18/19 282 lb (127.9 kg)  03/02/19 279 lb (126.6 kg)  02/23/19 278 lb 12.8 oz (126.5 kg)    Physical Exam  Nursing note and vitals reviewed. Constitutional: She is oriented to person, place, and time. She appears well-developed. No distress.  HENT:  Head: Normocephalic and atraumatic.  Right Ear: There is tenderness. No mastoid tenderness. Tympanic membrane is not bulging. A middle ear effusion is present.  Left Ear: Tympanic membrane,  external ear and ear canal normal.  Mouth/Throat: Oropharynx is clear and moist and mucous membranes are normal.  Mild erythema right ear canal.  Eyes: Pupils are equal, round, and reactive to light. Conjunctivae are normal.  Cardiovascular: Normal rate and regular rhythm.  No murmur heard. Respiratory: Effort normal and breath sounds normal. No respiratory distress.  GI: Soft. She exhibits no mass. There is no hepatomegaly. There is no abdominal tenderness.  Musculoskeletal:        General: No edema.  Lymphadenopathy:    She has no cervical adenopathy.  Neurological: She is alert and oriented to person, place, and  time. She has normal strength. No cranial nerve deficit. Gait normal.  Skin: Skin is warm. No rash noted. No erythema.  Psychiatric: She has a normal mood and affect.  Well groomed, good eye contact.   ASSESSMENT AND PLAN:   [name] was seen today for chronic disease management.  Diagnoses and all orders for this visit:  Vitamin D deficiency, unspecified Further recommendations will be given according to 25 OH vit D results.  -     VITAMIN D 25 Hydroxy (Vit-D Deficiency, Fractures)  B12 deficiency No changes in current management, will follow labs done today and will give further recommendations accordingly.  -     Vitamin B12  Acute otitis externa of right ear, unspecified type Topical abx recommended. Monitor for signs of complications. F/U as needed.  -     neomycin-polymyxin-hydrocortisone (CORTISPORIN) OTIC solution; Place 3 drops into the right ear 4 (four) times daily.  Class 3 severe obesity with serious comorbidity and body mass index (BMI) of 45.0 to 49.9 in adult, unspecified obesity type ([place]) Last wt here in the office 282 Lb, Wt has been stable. Regular physical activity and following a healthful diet consistently will help with wt loss and with anxiety/depression.  Dysfunction of right eustachian tube Auto inflation maneuvers a few  times during the day. Continue Flonase nasal spray.  Return in about 1 year (around 05/17/2020) for cpe.   Jermy Couper G. Martinique, MD  [facility]. [address] office.

## 2019-05-18 NOTE — Patient Instructions (Signed)
A few things to remember from today's visit:   Vitamin D deficiency, unspecified - Plan: VITAMIN D 25 Hydroxy (Vit-D Deficiency, Fractures)  B12 deficiency - Plan: Vitamin B12  Acute otitis externa of right ear, unspecified type - Plan: neomycin-polymyxin-hydrocortisone (CORTISPORIN) OTIC solution  Class 3 severe obesity with serious comorbidity and body mass index (BMI) of 45.0 to 49.9 in adult, unspecified obesity type ([place])  Dysfunction of right eustachian tube  Intermittent fasting could be a good options for you.  Add a over the counter antihistaminic, Cetirizine 10 mg to take daily. Nasal irrigations with saline as needed.   Eustachian Tube Dysfunction  Eustachian tube dysfunction refers to a condition in which a blockage develops in the narrow passage that connects the middle ear to the back of the nose (eustachian tube). The eustachian tube regulates air pressure in the middle ear by letting air move between the ear and nose. It also helps to drain fluid from the middle ear space. Eustachian tube dysfunction can affect one or both ears. When the eustachian tube does not function properly, air pressure, fluid, or both can build up in the middle ear. What are the causes? This condition occurs when the eustachian tube becomes blocked or cannot open normally. Common causes of this condition include:  Ear infections.  Colds and other infections that affect the nose, mouth, and throat (upper respiratory tract).  Allergies.  Irritation from cigarette smoke.  Irritation from stomach acid coming up into the esophagus (gastroesophageal reflux). The esophagus is the tube that carries food from the mouth to the stomach.  Sudden changes in air pressure, such as from descending in an airplane or scuba diving.  Abnormal growths in the nose or throat, such as: ? Growths that line the nose (nasal polyps). ? Abnormal growth of cells (tumors). ? Enlarged tissue at the back of the throat  (adenoids). What increases the risk? You are more likely to develop this condition if:  You smoke.  You are overweight.  You are a child who has: ? Certain birth defects of the mouth, such as cleft palate. ? Large tonsils or adenoids. What are the signs or symptoms? Common symptoms of this condition include:  A feeling of fullness in the ear.  Ear pain.  Clicking or popping noises in the ear.  Ringing in the ear.  Hearing loss.  Loss of balance.  Dizziness. Symptoms may get worse when the air pressure around you changes, such as when you travel to an area of high elevation, fly on an airplane, or go scuba diving. How is this diagnosed? This condition may be diagnosed based on:  Your symptoms.  A physical exam of your ears, nose, and throat.  Tests, such as those that measure: ? The movement of your eardrum (tympanogram). ? Your hearing (audiometry). How is this treated? Treatment depends on the cause and severity of your condition.  In mild cases, you may relieve your symptoms by moving air into your ears. This is called "popping the ears."  In more severe cases, or if you have symptoms of fluid in your ears, treatment may include: ? Medicines to relieve congestion (decongestants). ? Medicines that treat allergies (antihistamines). ? Nasal sprays or ear drops that contain medicines that reduce swelling (steroids). ? A procedure to drain the fluid in your eardrum (myringotomy). In this procedure, a small tube is placed in the eardrum to:  Drain the fluid.  Restore the air in the middle ear space. ? A procedure to  insert a balloon device through the nose to inflate the opening of the eustachian tube (balloon dilation). Follow these instructions at home: Lifestyle  Do not do any of the following until your health care provider approves: ? Travel to high altitudes. ? Fly in airplanes. ? Work in a Pension scheme manager or room. ? Scuba dive.  Do not use any  products that contain nicotine or tobacco, such as cigarettes and e-cigarettes. If you need help quitting, ask your health care provider.  Keep your ears dry. Wear fitted earplugs during showering and bathing. Dry your ears completely after. General instructions  Take over-the-counter and prescription medicines only as told by your health care provider.  Use techniques to help pop your ears as recommended by your health care provider. These may include: ? Chewing gum. ? Yawning. ? Frequent, forceful swallowing. ? Closing your mouth, holding your nose closed, and gently blowing as if you are trying to blow air out of your nose.  Keep all follow-up visits as told by your health care provider. This is important. Contact a health care provider if:  Your symptoms do not go away after treatment.  Your symptoms come back after treatment.  You are unable to pop your ears.  You have: ? A fever. ? Pain in your ear. ? Pain in your head or neck. ? Fluid draining from your ear.  Your hearing suddenly changes.  You become very dizzy.  You lose your balance. Summary  Eustachian tube dysfunction refers to a condition in which a blockage develops in the eustachian tube.  It can be caused by ear infections, allergies, inhaled irritants, or abnormal growths in the nose or throat.  Symptoms include ear pain, hearing loss, or ringing in the ears.  Mild cases are treated with maneuvers to unblock the ears, such as yawning or ear popping.  Severe cases are treated with medicines. Surgery may also be done (rare). This information is not intended to replace advice given to you by your health care provider. Make sure you discuss any questions you have with your health care provider. Document Released: 06/17/2015 Document Revised: 09/10/2017 Document Reviewed: 09/10/2017 Elsevier Patient Education  [place].  Please be sure medication list is accurate. If a new problem present, please  set up appointment sooner than planned today.

## 2019-05-20 ENCOUNTER — Encounter: Payer: Self-pay | Admitting: Family Medicine

## 2019-05-21 ENCOUNTER — Encounter: Payer: Self-pay | Admitting: Family Medicine

## 2019-07-31 NOTE — Progress Notes (Signed)
Virtual Visit via Video Note The purpose of this virtual visit is to provide medical care while limiting exposure to the novel coronavirus.    Consent was obtained for video visit:  Yes.   Answered questions that patient had about telehealth interaction:  Yes.   I discussed the limitations, risks, security and privacy concerns of performing an evaluation and management service by telemedicine. I also discussed with the patient that there may be a patient responsible charge related to this service. The patient expressed understanding and agreed to proceed.  Pt location: Home Physician Location: office Name of referring provider:  Martinique, Betty G, MD I connected with [name] [name] at patients initiation/request on 08/03/2019 at  1:30 PM EST by video enabled telemedicine application and verified that I am speaking with the correct person using two identifiers. 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  UPDATE: Intensity:  Moderate to severe Duration:  Usually an hour without sumatriptan.  For severe migraines, improves after an hour of taking sumatriptan and lasts 3 to 4 hours.   Frequency:  3 days a month (1 severe) Frequency of abortive medication: 3 times Current NSAIDS:none Current analgesics:Tylenol, Current triptans:sumatriptan 100mg  Current ergotamine:none Current anti-emetic:none Current muscle relaxants:baclofen Current anti-anxiolytic:clonazepam Current sleep aide:none Current Antihypertensive medications:none Current Antidepressant medications:Trintellix Current Anticonvulsant medications:Topirmate 100mg  at bedtime Current anti-CGRP:none Current Vitamins/Herbal/Supplements:B12; D Current  Antihistamines/Decongestants:none Other therapy:none Hormone/birth control:Sprintec  She reports episode of syncope last weekend.  She got up that morning and developed a severe abdominal pain with lightheadedness, diaphoresis and she passed out for a couple of seconds.  Before she passed out, she sat down on the floor.  Did not fall or hit her head.  No associated headache or palpitations.  She reports history of syncope, last event was 8 months ago.  It is often associated with pain.  Caffeine:No coffee. Cut back on soda and sweet tea (12 oz a day). Alcohol:Rarely (once every 6 months) Diet:Drinks Coke or Sprite but has cut back to 12 oz a day. Increased water intake (64 oz of water) Exercise:No Depression:stable; Anxiety:some Other pain:no Sleep hygiene:Poor. Difficulty falling asleep  HISTORY: Onset:  Around puberty.Gradually getting worse over the years.She was seen in the Urgent Care on 4/10/20for an intractable migraine that didn't respond to OTC or oxycodone that lasted a week, where she received a headache cocktail of Toradol 30mg , Compazine 10mg  and Benadryl 25mg which took the edge off but did not break it. Location:Left frontal and temporal region as well as a tenderness on the back of her head. Quality:stabbing Initial intensity:6-8/10. Shedenies new headache, thunderclap headache Aura:no Premonitory Phase:no Postdrome:Feels off-kilter Associated symptoms:Nausea, photophobia, phonophobia, sometimes vomiting.Shedenies associated visual disturbance, autonomic symptoms orunilateral numbness or weakness. Initial Duration:Usually last 2 days Initial Frequency:Severe migraines occurs 2-3 days prior to onset of menses. More moderate headaches occur in between. 5 days a month. Initial Frequency of abortive medication:5 days a month Triggers:  Hormonal, emotional stress, lack of sleep Relieving factors: Cool wash cloth, lay  down Activity:Aggravates  Past NSAIDS:Ibuprofen, naproxen Past analgesics:Excedrin Migraine Past abortive triptans:  Past abortive ergotamine:none Past muscle relaxants:Flexeril Past anti-emetic:promethazine; Zofran ODT 4mg  Past antihypertensive medications:none Past antidepressant medications:Amitriptyline, Effexor XR 75mg , Celexa, Prozac Past anticonvulsant medications:none Past anti-CGRP:none Past vitamins/Herbal/Supplements:none Past antihistamines/decongestants:none Other past therapies:none  Family history  of headache:Mother, brother  Past Medical History: Past Medical History:  Diagnosis Date  . Anxiety   . Asthma    childhood exercise induced only  . Cancer ([place])    appendix  . Cholecystitis chronic, acute   . Depression   . Gallstones   . Headache(784.0)    migraines  . Heart murmur    no indications for 7-32yrs per pt.    Medications: Outpatient Encounter Medications as of 08/03/2019  Medication Sig  . clonazePAM (KLONOPIN) 0.5 MG tablet Take 1 tablet (0.5 mg total) by mouth daily as needed for anxiety.  . fluticasone (FLONASE) 50 MCG/ACT nasal spray Place 1 spray into both nostrils 2 (two) times daily.  Marland Kitchen ibuprofen (ADVIL,MOTRIN) 200 MG tablet Take 400 mg by mouth every 6 (six) hours as needed for headache or moderate pain.  . magnesium oxide (MAG-OX) 400 (241.3 Mg) MG tablet Take 1 tablet (400 mg total) by mouth 2 (two) times daily.  Marland Kitchen neomycin-polymyxin-hydrocortisone (CORTISPORIN) OTIC solution Place 3 drops into the right ear 4 (four) times daily.  . norgestimate-ethinyl estradiol (SPRINTEC 28) 0.25-35 MG-MCG tablet Take 1 tablet by mouth daily.  . ondansetron (ZOFRAN ODT) 4 MG disintegrating tablet Take 1 tablet (4 mg total) by mouth every 8 (eight) hours as needed for nausea or vomiting.  . SUMAtriptan (IMITREX) 100 MG tablet Take 1 tablet earliest onset of migraine.  May repeat in 2 hours if headache persists or recurs.   Maximum 2 tablets in 24hrs  . topiramate (TOPAMAX) 100 MG tablet TAKE 1 TABLET BY MOUTH AT BEDTIME  . TRINTELLIX 5 MG TABS tablet Take 5 mg by mouth daily.  . vitamin B-12 1000 MCG tablet Take 1 tablet (1,000 mcg total) by mouth daily.  . Vitamin D, Ergocalciferol, (DRISDOL) 1.25 MG (50000 UT) CAPS capsule 1 caps weekly x 8 and then continue q 2 weeks.  Marland Kitchen zolpidem (AMBIEN) 5 MG tablet Take 1 tablet (5 mg total) by mouth at bedtime as needed for sleep. (Patient taking differently: Take 5 mg by mouth at bedtime as needed for sleep. Has not started)   No facility-administered encounter medications on file as of 08/03/2019.    Allergies: No Known Allergies  Family History: Family History  Problem Relation Age of Onset  . Cancer Father        Leukemia  . ADD / ADHD Other        family hx  . Breast cancer Other        fhx  . Mental illness Other        fhx  . Heart disease Other        fhx  . Hypertension Other        fhx  . Heart defect Brother     Social History: Social History   Socioeconomic History  . Marital status: Single    Spouse name: Not on file  . Number of children: 3  . Years of education: Not on file  . Highest education level: Associate degree: academic program  Occupational History  . Occupation: [employer]  Tobacco Use  . Smoking status: Never Smoker  . Smokeless tobacco: Never Used  Substance and Sexual Activity  . Alcohol use: Yes    Comment: occassional  . Drug use: No  . Sexual activity: Yes    Partners: Male    Birth control/protection: Pill  Other Topics Concern  . Not on file  Social History Narrative   Single, lives with significant other [name])   #  3 children: ages 13-11-2 1/2 months   Employer: [name]   Independent ADLs      Patient is right-handed. She lives with her boy friend.   One level home    Caffeine - 20 oz of soda or tea a day   Social Determinants of Health   Financial Resource Strain:   . Difficulty of Paying Living  Expenses: Not on file  Food Insecurity:   . Worried About Charity fundraiser in the Last Year: Not on file  . Ran Out of Food in the Last Year: Not on file  Transportation Needs:   . Lack of Transportation (Medical): Not on file  . Lack of Transportation (Non-Medical): Not on file  Physical Activity:   . Days of Exercise per Week: Not on file  . Minutes of Exercise per Session: Not on file  Stress:   . Feeling of Stress : Not on file  Social Connections:   . Frequency of Communication with Friends and Family: Not on file  . Frequency of Social Gatherings with Friends and Family: Not on file  . Attends Religious Services: Not on file  . Active Member of Clubs or Organizations: Not on file  . Attends Archivist Meetings: Not on file  . Marital Status: Not on file  Intimate Partner Violence:   . Fear of Current or Ex-Partner: Not on file  . Emotionally Abused: Not on file  . Physically Abused: Not on file  . Sexually Abused: Not on file    Observations/Objective:   Height 5\' 5"  (1.651 m), weight 275 lb (124.7 kg), last menstrual period 07/19/2019. No acute distress.  Alert and oriented.  Speech fluent and not dysarthric.  Language intact.  Eyes orthophoric on primary gaze.  Face symmetric.  Assessment and Plan:   1.  Menstrually related migraine, without status migrainosus, not intractable 2.  Syncope.  Possibly vasovagal due to abdominal pain.  However, she reports recurrence over the years.  1.  For preventative management, topiramate 100mg  at bedtime 2.  For abortive therapy, sumatriptan 100mg  3.  Limit use of pain relievers to no more than 2 days out of week to prevent risk of rebound or medication-overuse headache. 4.  Keep headache diary 5.  Exercise, hydration, caffeine cessation, sleep hygiene, monitor for and avoid triggers 6. Advised to discuss recurrent syncope with Dr. Martinique 7. Follow up 6 months.   Follow Up Instructions:    -I discussed the  assessment and treatment plan with the patient. The patient was provided an opportunity to ask questions and all were answered. The patient agreed with the plan and demonstrated an understanding of the instructions.   The patient was advised to call back or seek an in-person evaluation if the symptoms worsen or if the condition fails to improve as anticipated.    Dudley Major, DO

## 2019-08-03 ENCOUNTER — Telehealth (INDEPENDENT_AMBULATORY_CARE_PROVIDER_SITE_OTHER): Payer: Medicaid Other | Admitting: Neurology

## 2019-08-03 ENCOUNTER — Encounter: Payer: Self-pay | Admitting: Neurology

## 2019-08-03 ENCOUNTER — Other Ambulatory Visit: Payer: Self-pay

## 2019-08-03 VITALS — Ht 65.0 in | Wt 275.0 lb

## 2019-08-03 DIAGNOSIS — R55 Syncope and collapse: Secondary | ICD-10-CM | POA: Diagnosis not present

## 2019-08-03 DIAGNOSIS — G43829 Menstrual migraine, not intractable, without status migrainosus: Secondary | ICD-10-CM

## 2019-10-30 DIAGNOSIS — T7840XA Allergy, unspecified, initial encounter: Secondary | ICD-10-CM | POA: Diagnosis not present

## 2019-11-25 DIAGNOSIS — Z23 Encounter for immunization: Secondary | ICD-10-CM | POA: Diagnosis not present

## 2019-12-09 DIAGNOSIS — M79642 Pain in left hand: Secondary | ICD-10-CM | POA: Diagnosis not present

## 2019-12-09 DIAGNOSIS — R0789 Other chest pain: Secondary | ICD-10-CM | POA: Diagnosis not present

## 2019-12-09 DIAGNOSIS — M79632 Pain in left forearm: Secondary | ICD-10-CM | POA: Diagnosis not present

## 2020-02-02 NOTE — Progress Notes (Deleted)
NEUROLOGY FOLLOW UP OFFICE NOTE  [name] [name] [id number]  HISTORY OF PRESENT ILLNESS: [name] [name] is a 39year old female with anxiety, depression, insomnia and migrainesand history of carcinoid tumor of appendixwhofollows up for migraines.  UPDATE: Intensity:Moderate to severe Duration:Usually an hour without sumatriptan.  For severe migraines, improves after an hour of taking sumatriptan and lasts 3 to 4 hours. Frequency:3 days a month (1 severe) Frequency of abortive medication:3 times Current NSAIDS:none Current analgesics:Tylenol, Current triptans:sumatriptan 100mg  Current ergotamine:none Current anti-emetic:none Current muscle relaxants:baclofen Current anti-anxiolytic:clonazepam Current sleep aide:none Current Antihypertensive medications:none Current Antidepressant medications:Trintellix Current Anticonvulsant medications:Topirmate100mg  at bedtime Current anti-CGRP:none Current Vitamins/Herbal/Supplements:B12; D Current Antihistamines/Decongestants:none Other therapy:none Hormone/birth control:Sprintec  She reports episode of syncope last weekend.  She got up that morning and developed a severe abdominal pain with lightheadedness, diaphoresis and she passed out for a couple of seconds.  Before she passed out, she sat down on the floor.  Did not fall or hit her head.  No associated headache or palpitations.  She reports history of syncope, last event was 8 months ago.  It is often associated with pain.  Caffeine:No coffee.Cut back on soda and sweet tea (12 oz a day). Alcohol:Rarely (once every 6 months) Diet:Drinks Coke or Spritebut has cut back to 12 oz a day.Increased water intake (64 oz of water) Exercise:No Depression:stable; Anxiety:some Other pain:no Sleep hygiene:Poor. Difficulty falling asleep  HISTORY: Onset: Around puberty.Gradually getting worse over the years.She was seen  in the Urgent Care on 4/10/20for an intractable migraine that didn't respond to OTC or oxycodone that lasted a week, where she received a headache cocktail of Toradol 30mg , Compazine 10mg  and Benadryl 25mg which took the edge off but did not break it. Location:Left frontal and temporal region as well as a tenderness on the back of her head. Quality:stabbing Initial intensity:6-8/10. Shedenies new headache, thunderclap headache Aura:no Premonitory Phase:no Postdrome:Feels off-kilter Associated symptoms:Nausea, photophobia, phonophobia, sometimes vomiting.Shedenies associated visual disturbance, autonomic symptoms orunilateral numbness or weakness. InitialDuration:Usually last 2 days InitialFrequency:Severe migraines occurs 2-3 days prior to onset of menses. More moderate headaches occur in between. 5 days a month. InitialFrequency of abortive medication:5 days a month Triggers: Hormonal, emotional stress, lack of sleep Relieving factors: Cool wash cloth, lay down Activity:Aggravates  Past NSAIDS:Ibuprofen, naproxen Past analgesics:Excedrin Migraine Past abortive triptans:  Past abortive ergotamine:none Past muscle relaxants:Flexeril Past anti-emetic:promethazine; Zofran ODT 4mg  Past antihypertensive medications:none Past antidepressant medications:Amitriptyline, Effexor XR 75mg , Celexa, Prozac Past anticonvulsant medications:none Past anti-CGRP:none Past vitamins/Herbal/Supplements:none Past antihistamines/decongestants:none Other past therapies:none  Family history of headache:Mother, brother  PAST MEDICAL HISTORY: Past Medical History:  Diagnosis Date  . Anxiety   . Asthma    childhood exercise induced only  . Cancer ([place])    appendix  . Cholecystitis chronic, acute   . Depression   . Gallstones   . Headache(784.0)    migraines  . Heart murmur    no indications for 7-53yrs per pt.    MEDICATIONS: Current  Outpatient Medications on File Prior to Visit  Medication Sig Dispense Refill  . clonazePAM (KLONOPIN) 0.5 MG tablet Take 1 tablet (0.5 mg total) by mouth daily as needed for anxiety. 30 tablet 2  . ibuprofen (ADVIL,MOTRIN) 200 MG tablet Take 400 mg by mouth every 6 (six) hours as needed for headache or moderate pain.    . magnesium oxide (MAG-OX) 400 (241.3 Mg) MG tablet Take 1 tablet (400 mg total) by mouth 2 (two) times daily. 4 tablet 0  . norgestimate-ethinyl estradiol (SPRINTEC 28) 0.25-35 MG-MCG  tablet Take 1 tablet by mouth daily. 84 tablet 3  . ondansetron (ZOFRAN ODT) 4 MG disintegrating tablet Take 1 tablet (4 mg total) by mouth every 8 (eight) hours as needed for nausea or vomiting. (Patient not taking: Reported on 08/03/2019) 20 tablet 0  . SUMAtriptan (IMITREX) 100 MG tablet Take 1 tablet earliest onset of migraine.  May repeat in 2 hours if headache persists or recurs.  Maximum 2 tablets in 24hrs 10 tablet 3  . topiramate (TOPAMAX) 100 MG tablet TAKE 1 TABLET BY MOUTH AT BEDTIME 90 tablet 3  . TRINTELLIX 5 MG TABS tablet Take 5 mg by mouth daily.    . vitamin B-12 1000 MCG tablet Take 1 tablet (1,000 mcg total) by mouth daily. 30 tablet 0  . Vitamin D, Ergocalciferol, (DRISDOL) 1.25 MG (50000 UT) CAPS capsule 1 caps weekly x 8 and then continue q 2 weeks. 12 capsule 0  . zolpidem (AMBIEN) 5 MG tablet Take 1 tablet (5 mg total) by mouth at bedtime as needed for sleep. (Patient taking differently: Take 5 mg by mouth at bedtime as needed for sleep. Has not started) 30 tablet 2   No current facility-administered medications on file prior to visit.    ALLERGIES: No Known Allergies  FAMILY HISTORY: Family History  Problem Relation Age of Onset  . Cancer Father        Leukemia  . ADD / ADHD Other        family hx  . Breast cancer Other        fhx  . Mental illness Other        fhx  . Heart disease Other        fhx  . Hypertension Other        fhx  . Heart defect Brother      SOCIAL HISTORY: Social History   Socioeconomic History  . Marital status: Single    Spouse name: Not on file  . Number of children: 3  . Years of education: Not on file  . Highest education level: Associate degree: academic program  Occupational History  . Occupation: [employer]  Tobacco Use  . Smoking status: Never Smoker  . Smokeless tobacco: Never Used  Vaping Use  . Vaping Use: Never used  Substance and Sexual Activity  . Alcohol use: Yes    Comment: occassional  . Drug use: No  . Sexual activity: Yes    Partners: Male    Birth control/protection: Pill  Other Topics Concern  . Not on file  Social History Narrative   Single, lives with significant other [name])   #3 children: ages 13-11-2 1/2 months   Employer: [name]   Independent ADLs      Patient is right-handed. She lives with her boy friend.   One level home    Caffeine - 20 oz of soda or tea a day   Social Determinants of Health   Financial Resource Strain:   . Difficulty of Paying Living Expenses: Not on file  Food Insecurity:   . Worried About Charity fundraiser in the Last Year: Not on file  . Ran Out of Food in the Last Year: Not on file  Transportation Needs:   . Lack of Transportation (Medical): Not on file  . Lack of Transportation (Non-Medical): Not on file  Physical Activity:   . Days of Exercise per Week: Not on file  . Minutes of Exercise per Session: Not on file  Stress:   .  Feeling of Stress : Not on file  Social Connections:   . Frequency of Communication with Friends and Family: Not on file  . Frequency of Social Gatherings with Friends and Family: Not on file  . Attends Religious Services: Not on file  . Active Member of Clubs or Organizations: Not on file  . Attends Archivist Meetings: Not on file  . Marital Status: Not on file  Intimate Partner Violence:   . Fear of Current or Ex-Partner: Not on file  . Emotionally Abused: Not on file  . Physically Abused: Not on  file  . Sexually Abused: Not on file    PHYSICAL EXAM: *** General: No acute distress.  Patient appears well-groomed.   Head:  Normocephalic/atraumatic Eyes:  Fundi examined but not visualized Neck: supple, no paraspinal tenderness, full range of motion Heart:  Regular rate and rhythm Lungs:  Clear to auscultation bilaterally Back: No paraspinal tenderness Neurological Exam: alert and oriented to person, place, and time. Attention span and concentration intact, recent and remote memory intact, fund of knowledge intact.  Speech fluent and not dysarthric, language intact.  CN II-XII intact. Bulk and tone normal, muscle strength 5/5 throughout.  Sensation to light touch, temperature and vibration intact.  Deep tendon reflexes 2+ throughout, toes downgoing.  Finger to nose and heel to shin testing intact.  Gait normal, Romberg negative.  IMPRESSION: Menstrually related migraine, without status migrainosus, not intractable  PLAN: 1.  For preventative management, topiramate 100mg  at bedtime 2.  For abortive therapy, sumatriptan 100mg  3.  Limit use of pain relievers to no more than 2 days out of week to prevent risk of rebound or medication-overuse headache. 4.  Keep headache diary 5.  Exercise, hydration, caffeine cessation, sleep hygiene, monitor for and avoid triggers 6.  Follow up ***   Metta Clines, DO  CC: Betty Martinique, MD

## 2020-02-04 ENCOUNTER — Ambulatory Visit: Payer: Medicaid Other | Admitting: Neurology

## 2020-03-02 ENCOUNTER — Telehealth: Payer: Medicaid Other | Admitting: Family

## 2020-03-02 DIAGNOSIS — R399 Unspecified symptoms and signs involving the genitourinary system: Secondary | ICD-10-CM

## 2020-03-02 DIAGNOSIS — N898 Other specified noninflammatory disorders of vagina: Secondary | ICD-10-CM

## 2020-03-02 MED ORDER — CEPHALEXIN 500 MG PO CAPS: 500.0000 mg | ORAL_CAPSULE | Freq: Two times a day (BID) | ORAL | 0 refills | Status: DC

## 2020-03-02 NOTE — Progress Notes (Signed)
We are sorry that you are not feeling well.  Here is how we plan to help!  Based on what you shared with me it looks like you most likely have a simple urinary tract infection.  Since you state your discharge is not abnormal, we will treat as a UTI. If your symptoms do not improve or worsen, you need to be seen face to face for further testing.   A UTI (Urinary Tract Infection) is a bacterial infection of the bladder.  Most cases of urinary tract infections are simple to treat but a key part of your care is to encourage you to drink plenty of fluids and watch your symptoms carefully.  I have prescribed Keflex 500 mg twice a day for 7 days.  Your symptoms should gradually improve. Call us if the burning in your urine worsens, you develop worsening fever, back pain or pelvic pain or if your symptoms do not resolve after completing the antibiotic.  Urinary tract infections can be prevented by drinking plenty of water to keep your body hydrated.  Also be sure when you wipe, wipe from front to back and don't hold it in!  If possible, empty your bladder every 4 hours.  Your e-visit answers were reviewed by a board certified advanced clinical practitioner to complete your personal care plan.  Depending on the condition, your plan could have included both over the counter or prescription medications.  If there is a problem please reply  once you have received a response from your provider.  Your safety is important to [place].  If you have drug allergies check your prescription carefully.    You can use MyChart to ask questions about today's visit, request a non-urgent call back, or ask for a work or school excuse for 24 hours related to this e-Visit. If it has been greater than 24 hours you will need to follow up with your provider, or enter a new e-Visit to address those concerns.   You will get an e-mail in the next two days asking about your experience.  I hope that your e-visit has been valuable and  will speed your recovery. Thank you for using e-visits.   Approximately 5 minutes was spent documenting and reviewing patient's chart.

## 2020-03-02 NOTE — Progress Notes (Signed)
Based on what you shared with me, I feel your condition warrants further evaluation and I recommend that you be seen for a face to face office visit.  Given you are having UTI symptoms with vaginal discharge, you need to be seen face to face for further testing to rule out a mores serious infection.    NOTE: If you entered your credit card information for this eVisit, you will not be charged. You may see a "hold" on your card for the $35 but that hold will drop off and you will not have a charge processed.   If you are having a true medical emergency please call 911.      For an urgent face to face visit, [facility] has five urgent care centers for your convenience:      NEW:  [facility] [place] [facility] [employer] Get Driving Directions [phone] [address] [place], [place] [address]  10 am - 6pm Monday - Friday    [facility] Get Driving Directions [phone] [place], [place] [address]  10 am to 8 pm Monday-Friday  12 pm to 8 pm [facility] at [facility] Get Driving Directions [phone] [address] [place], [place], [place] [address]  8 am to 8 pm Monday-Friday  9 am to 6 pm Saturday  11 am to 6 pm Sunday     [facility] Get Driving Directions  [phone] [address] [place], [place] [address]  8 am to 8 pm Monday-Friday  8 am to 4 pm [facility] at [place] Get Driving Directions [phone] [address] [place], [place] [address]  12 pm to 6 pm Monday-Friday      Your e-visit answers were reviewed by a board certified advanced clinical practitioner to complete your personal care plan.  Thank you for using e-Visits.

## 2020-03-14 ENCOUNTER — Encounter: Payer: Self-pay | Admitting: Family Medicine

## 2020-03-14 ENCOUNTER — Other Ambulatory Visit (HOSPITAL_COMMUNITY)
Admission: RE | Admit: 2020-03-14 | Discharge: 2020-03-14 | Disposition: A | Discharge status: Home / Self Care | Payer: Medicaid Other | Source: Ambulatory Visit | Attending: Family Medicine | Admitting: Family Medicine

## 2020-03-14 ENCOUNTER — Other Ambulatory Visit: Payer: Self-pay

## 2020-03-14 ENCOUNTER — Ambulatory Visit: Payer: Medicaid Other | Admitting: Family Medicine

## 2020-03-14 VITALS — BP 130/80 | HR 87 | Resp 16 | Ht 65.0 in | Wt 297.0 lb

## 2020-03-14 DIAGNOSIS — Z30011 Encounter for initial prescription of contraceptive pills: Secondary | ICD-10-CM

## 2020-03-14 DIAGNOSIS — Z124 Encounter for screening for malignant neoplasm of cervix: Secondary | ICD-10-CM | POA: Diagnosis not present

## 2020-03-14 DIAGNOSIS — E559 Vitamin D deficiency, unspecified: Secondary | ICD-10-CM

## 2020-03-14 DIAGNOSIS — Z01419 Encounter for gynecological examination (general) (routine) without abnormal findings: Secondary | ICD-10-CM

## 2020-03-14 DIAGNOSIS — Z23 Encounter for immunization: Secondary | ICD-10-CM | POA: Diagnosis not present

## 2020-03-14 MED ORDER — NORGESTIMATE-ETH ESTRADIOL 0.25-35 MG-MCG PO TABS: 1.0000 | ORAL_TABLET | Freq: Every day | ORAL | 3 refills | Status: DC

## 2020-03-14 NOTE — Patient Instructions (Addendum)
Today you have you routine preventive visit. A few things to remember from today's visit:   Encounter for well woman exam with routine gynecological exam  Encounter for initial prescription of contraceptive pills - Plan: norgestimate-ethinyl estradiol ([place] 28) 0.25-35 MG-MCG tablet  Screening for malignant neoplasm of cervix - Plan: PAP [[place]]  Vitamin D deficiency, unspecified - Plan: VITAMIN D 25 Hydroxy (Vit-D Deficiency, Fractures), BASIC METABOLIC PANEL WITH GFR  If you need refills please call your pharmacy. Do not use My Chart to request refills or for acute issues that need immediate attention.   Please be sure medication list is accurate. If a new problem present, please set up appointment sooner than planned today.  At least 150 minutes of moderate exercise per week, daily brisk walking for 15-30 min is a good exercise option. Healthy diet low in saturated (animal) fats and sweets and consisting of fresh fruits and vegetables, lean meats such as fish and white chicken and whole grains.  These are some of recommendations for screening depending of age and risk factors:  - Vaccines:  Tdap vaccine every 10 years.  Shingles vaccine recommended at age 21, could be given after 39 years of age but not sure about insurance coverage.   Pneumonia vaccines: Pneumovax at 89. Sometimes Pneumovax is giving earlier if history of smoking, lung disease,diabetes,kidney disease among some.  Screening for diabetes at age 71 and every 3 years.  Cervical cancer prevention:  Pap smear starts at 39 years of age and continues periodically until 39 years old in low risk women. Pap smear every 3 years between 84 and 62 years old. Pap smear every 3-5 years between women 74 and older if pap smear negative and HPV screening negative.   -Breast cancer: Mammogram: There is disagreement between experts about when to start screening in low risk asymptomatic female but recent  recommendations are to start screening at 63 and not later than 39 years old , every 1-2 years and after 39 yo q 2 years. Screening is recommended until 39 years old but some women can continue screening depending of healthy issues.  Colon cancer screening: Has been recently changed to 39 yo. Insurance may not cover until you are 39 years old. Screening is recommended until 39 years old.  Cholesterol disorder screening at age 48 and every 3 years.  Also recommended:  1. Dental visit- Brush and floss your teeth twice daily; visit your dentist twice a year. 2. Eye doctor- Get an eye exam at least every 2 years. 3. Helmet use- Always wear a helmet when riding a bicycle, motorcycle, rollerblading or skateboarding. 4. Safe sex- If you may be exposed to sexually transmitted infections, use a condom. 5. Seat belts- Seat belts can save your live; always wear one. 6. Smoke/Carbon Monoxide detectors- These detectors need to be installed on the appropriate level of your home. Replace batteries at least once a year. 7. Skin cancer- When out in the sun please cover up and use sunscreen 15 SPF or higher. 8. Violence- If anyone is threatening or hurting you, please tell your healthcare provider.  9. Drink alcohol in moderation- Limit alcohol intake to one drink or less per day. Never drink and drive. 10. Calcium supplementation 1000 to 1200 mg daily, ideally through your diet.  Vitamin D supplementation 800 units daily.

## 2020-03-14 NOTE — Progress Notes (Signed)
HPI: [name] is a 39 y.o. female, who is here today for her routine physical.  Last CPE: 06/18/17  Regular exercise 3 or more time per week: Not consistently but her job is not sedentary.She has noted wt loss, went from size 22 to 21. Following a healthy diet: No. She lives with her fiance and 3 children.  Chronic medical problems: Insomnia,vit D def,chronic headache/migraine,anxiety,and abn pap smear among some. She follows with neurologist and psychiatrist.  Pap smear: 03/2019,normal.Gyn recommended repeating pap in a year. 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  Immunization History  Administered Date(s) Administered  . Influenza Split 01/06/2014  . Influenza,inj,Quad PF,6+ Mos 02/18/2017, 03/07/2018, 03/14/2020  . Rho (D) Immune Globulin 11/04/2012  . Td 08/08/2007  . Tdap 11/25/2017   Mammogram: N/A Colonoscopy: N/A DEXA: N/A  She has no concerns today. Vit D def: She is not on vit D supplementation.  Review of Systems  Constitutional: Positive for fatigue. Negative for appetite change and fever.  HENT: Negative for dental problem, hearing loss, mouth sores and sore throat.   Eyes: Negative for redness and visual disturbance.  Respiratory: Negative for cough, shortness of breath and wheezing.   Cardiovascular: Negative for chest pain and leg swelling.  Gastrointestinal: Negative for abdominal pain, nausea and vomiting.       No changes in bowel habits.  Endocrine: Negative for cold intolerance, heat intolerance, polydipsia, polyphagia and polyuria.  Genitourinary: Negative for decreased urine volume, dysuria, hematuria, vaginal bleeding and vaginal discharge.  Musculoskeletal: Negative for gait problem and myalgias.  Skin: Negative for color change and rash.  Allergic/Immunologic: Positive for environmental allergies.  Neurological: Positive for headaches (No more than usual). Negative for syncope and weakness.  Hematological: Negative  for adenopathy. Does not bruise/bleed easily.  Psychiatric/Behavioral: Positive for sleep disturbance. Negative for confusion and hallucinations.  All other systems reviewed and are negative.  Current Outpatient Medications on File Prior to Visit  Medication Sig Dispense Refill  . ibuprofen (ADVIL,MOTRIN) 200 MG tablet Take 400 mg by mouth every 6 (six) hours as needed for headache or moderate pain.    . magnesium oxide (MAG-OX) 400 (241.3 Mg) MG tablet Take 1 tablet (400 mg total) by mouth 2 (two) times daily. 4 tablet 0  . SUMAtriptan (IMITREX) 100 MG tablet Take 1 tablet earliest onset of migraine.  May repeat in 2 hours if headache persists or recurs.  Maximum 2 tablets in 24hrs 10 tablet 3  . topiramate (TOPAMAX) 100 MG tablet TAKE 1 TABLET BY MOUTH AT BEDTIME 90 tablet 3  . TRINTELLIX 5 MG TABS tablet Take 5 mg by mouth daily.    . vitamin B-12 1000 MCG tablet Take 1 tablet (1,000 mcg total) by mouth daily. 30 tablet 0  . Vitamin D, Ergocalciferol, (DRISDOL) 1.25 MG (50000 UT) CAPS capsule 1 caps weekly x 8 and then continue q 2 weeks. 12 capsule 0  . clonazePAM (KLONOPIN) 0.5 MG tablet Take 1 tablet (0.5 mg total) by mouth daily as needed for anxiety. 30 tablet 2  . zolpidem (AMBIEN) 5 MG tablet Take 1 tablet (5 mg total) by mouth at bedtime as needed for sleep. (Patient taking differently: Take 5 mg by mouth at bedtime as needed for sleep. Has not started) 30 tablet 2   No current facility-administered medications on file prior to visit.   Past Medical History:  Diagnosis Date  . Anxiety   . Asthma    childhood exercise induced only  .  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 Cholecystitis chronic, acute   . Depression   . Gallstones   . Headache(784.0)    migraines  . Heart murmur    no indications for 7-64yrs per pt.    Past Surgical History:  Procedure Laterality Date  . APPENDECTOMY    . CESAREAN SECTION N/A 11/03/2012   Procedure: PRIMARY CESAREAN SECTION;  Surgeon: Melina Schools, MD;  Location: [facility];  Service: Obstetrics;  Laterality: N/A;  . CHOLECYSTECTOMY N/A 12/23/2012   Procedure: LAPAROSCOPIC CHOLECYSTECTOMY WITH INTRAOPERATIVE CHOLANGIOGRAM;  Surgeon: Pedro Earls, MD;  Location: [facility];  Service: General;  Laterality: N/A;  Laparoscopic Cholecystectomy with IOC  . COLON SURGERY    . ERCP N/A 12/22/2012   Procedure: ENDOSCOPIC RETROGRADE CHOLANGIOPANCREATOGRAPHY (ERCP);  Surgeon: Gatha Mayer, MD;  Location: [facility];  Service: Endoscopy;  Laterality: N/A;  . INSERTION OF MESH N/A 07/15/2017   Procedure: INSERTION OF MESH;  Surgeon: Jovita Kussmaul, MD;  Location: [facility];  Service: General;  Laterality: N/A;  . LAPAROTOMY N/A 11/08/2012   Procedure: laparoscopic exploration and EXPLORATORY LAPAROTOMY;  Surgeon: Merrie Roof, MD;  Location: [facility];  Service: General;  Laterality: N/A;  . VENTRAL HERNIA REPAIR N/A 07/15/2017   Procedure: LAPAROSCOPIC ASSISTED VENTRAL HERNIA REPAIR;  Surgeon: Jovita Kussmaul, MD;  Location: [facility];  Service: General;  Laterality: N/A;    No Known Allergies  Family History  Problem Relation Age of Onset  . Cancer Father        Leukemia  . ADD / ADHD Other        family hx  . Breast cancer Other        fhx  . Mental illness Other        fhx  . Heart disease Other        fhx  . Hypertension Other        fhx  . Heart defect Brother     Social History   Socioeconomic History  . Marital status: Single    Spouse name: Not on file  . Number of children: 3  . Years of education: Not on file  . Highest education level: Associate degree: academic program  Occupational History  . Occupation: [employer]  Tobacco Use  . Smoking status: Never Smoker  . Smokeless tobacco: Never Used  Vaping Use  . Vaping Use: Never used  Substance and Sexual Activity  . Alcohol use: Yes    Comment: occassional  . Drug use: No  . Sexual activity: Yes    Partners: Male    Birth control/protection: Pill  Other Topics Concern  .  Not on file  Social History Narrative   Single, lives with significant other [name])   #3 children: ages 13-11-2 1/2 months   Employer: [name]   Independent ADLs      Patient is right-handed. She lives with her boy friend.   One level home    Caffeine - 20 oz of soda or tea a day   Social Determinants of Health   Financial Resource Strain:   . Difficulty of Paying Living Expenses: Not on file  Food Insecurity:   . Worried About Charity fundraiser in the Last Year: Not on file  . Ran Out of Food in the Last Year: Not on file  Transportation Needs:   . Lack of Transportation (Medical): Not on file  . Lack of Transportation (Non-Medical): Not on file  Physical Activity:   . Days of Exercise per Week: Not on file  . Minutes of Exercise per Session: Not on file  Stress:   . Feeling of Stress : Not on file  Social Connections:   . Frequency of Communication with Friends and Family: Not on file  . Frequency of Social Gatherings with Friends and Family: Not on file  . Attends Religious Services: Not on file  . Active Member of Clubs or Organizations: Not on file  . Attends Archivist Meetings: Not on file  . Marital Status: Not on file   Vitals:   03/14/20 1417  BP: 130/80  Pulse: 87  Resp: 16  SpO2: 97%   Body mass index is 49.42 kg/m.  Wt Readings from Last 3 Encounters:  03/14/20 297 lb (134.7 kg)  08/03/19 275 lb (124.7 kg)  05/18/19 282 lb (127.9 kg)    Physical Exam Vitals and nursing note reviewed. Exam conducted with a chaperone present.  Constitutional:      General: She is not in acute distress.    Appearance: She is well-developed.  HENT:     Head: Normocephalic and atraumatic.     Right Ear: Hearing, tympanic membrane, ear canal and external ear normal.     Left Ear: Hearing, tympanic membrane, ear canal and external ear normal.     Mouth/Throat:     Mouth: Mucous membranes are moist.     Pharynx: Oropharynx is clear. Uvula midline.    Eyes:     Extraocular Movements: Extraocular movements intact.     Conjunctiva/sclera: Conjunctivae normal.     Pupils: Pupils are equal, round, and reactive to light.  Neck:     Thyroid: No thyromegaly.     Trachea: No tracheal deviation.  Cardiovascular:     Rate and Rhythm: Normal rate and regular rhythm.     Pulses:          Dorsalis pedis pulses are 2+ on the right side and 2+ on the left side.     Heart sounds: No murmur heard.   Pulmonary:     Effort: Pulmonary effort is normal. No respiratory distress.     Breath sounds: Normal breath sounds.  Abdominal:     Palpations: Abdomen is soft. There is no hepatomegaly or mass.     Tenderness: There is no abdominal tenderness.  Genitourinary:    Labia:        Right: No rash, tenderness or lesion.        Left: No rash, tenderness or lesion.      Vagina: No vaginal discharge, erythema or tenderness.     Cervix: No cervical motion tenderness, discharge or friability.     Uterus: Not enlarged and not tender.      Adnexa:        Right: No mass, tenderness or fullness.         Left: No mass, tenderness or fullness.       Comments: Breast: No masses, skin abnormalities, or nipple discharge appreciated bilateral. Pap smear collected.  Musculoskeletal:     Comments: No major deformity or signs of synovitis appreciated.  Lymphadenopathy:     Cervical: No cervical adenopathy.     Upper Body:     Right upper body: No axillary adenopathy.     Left upper body: No axillary adenopathy.     Lower Body: No right inguinal adenopathy. No left inguinal adenopathy.  Skin:    General: Skin is warm.  Findings: No erythema or rash.  Neurological:     General: No focal deficit present.     Mental Status: She is alert and oriented to person, place, and time.     Cranial Nerves: No cranial nerve deficit.     Coordination: Coordination normal.     Gait: Gait normal.     Deep Tendon Reflexes:     Reflex Scores:      Bicep reflexes are 2+ on  the right side and 2+ on the left side.      Patellar reflexes are 2+ on the right side and 2+ on the left side. Psychiatric:        Mood and Affect: Mood and affect normal.     Comments: Well groomed, good eye contact.   ASSESSMENT AND PLAN:  [name] was here today annual physical examination.  Orders Placed This Encounter  Procedures  . Flu Vaccine QUAD 36+ mos IM  . VITAMIN D 25 Hydroxy (Vit-D Deficiency, Fractures)  . BASIC METABOLIC PANEL WITH GFR   Lab Results  Component Value Date   CREATININE 0.74 03/14/2020   BUN 14 03/14/2020   NA 140 03/14/2020   K 3.4 (L) 03/14/2020   CL 107 03/14/2020   CO2 26 03/14/2020    Encounter for well woman exam with routine gynecological exam We discussed the importance of regular physical activity and healthy diet for prevention of chronic illness and/or complications. Preventive guidelines reviewed. Vaccination up to date. Pap smear collected today. Mammogram in 12/2020. Next CPE in a year.  Encounter for initial prescription of contraceptive pills Well tolerated. She is aware of side effects.  -     norgestimate-ethinyl estradiol ([facility] 28) 0.25-35 MG-MCG tablet; Take 1 tablet by mouth daily.  Screening for malignant neoplasm of cervix -     PAP [[place]]  Vitamin D deficiency, unspecified Recommendations in regard to Vit D dose will be given according to 25 [place] vit D results.  Need for influenza vaccination -     Flu Vaccine QUAD 36+ mos IM   Return in 1 year (on 03/14/2021) for cpe.   Terriana Barreras G. [place], MD  [facility]. [place] office.  Today you have you routine preventive visit. A few things to remember from today's visit:   Encounter for well woman exam with routine gynecological exam  Encounter for initial prescription of contraceptive pills - Plan: norgestimate-ethinyl estradiol ([place] 28) 0.25-35 MG-MCG tablet  Screening for malignant neoplasm of cervix - Plan: PAP [[facility]]  Vitamin D deficiency, unspecified - Plan: VITAMIN D 25 Hydroxy (Vit-D Deficiency, Fractures), BASIC METABOLIC PANEL WITH GFR  If you need refills please call your pharmacy. Do not use My Chart to request refills or for acute issues that need immediate attention.   Please be sure medication list is accurate. If a new problem present, please set up appointment sooner than planned today.  At least 150 minutes of moderate exercise per week, daily brisk walking for 15-30 min is a good exercise option. Healthy diet low in saturated (animal) fats and sweets and consisting of fresh fruits and vegetables, lean meats such as fish and white chicken and whole grains.  These are some of recommendations for screening depending of age and risk factors:  - Vaccines:  Tdap vaccine every 10 years.  Shingles vaccine recommended at age 45, could be given after 39 years of age but not sure about insurance coverage.   Pneumonia vaccines: Pneumovax at 85. Sometimes  Pneumovax is giving earlier if history of smoking, lung disease,diabetes,kidney disease among some.  Screening for diabetes at age 78 and every 3 years.  Cervical cancer prevention:  Pap smear starts at 39 years of age and continues periodically until 39 years old in low risk women. Pap smear every 3 years between 80 and 75 years old. Pap smear every 3-5 years between women 73 and older if pap smear negative and HPV screening negative.   -Breast cancer: Mammogram: There is disagreement between experts about when to start screening in low risk asymptomatic female but recent recommendations are to start screening at 52 and not later than 39 years old , every 1-2 years and after 39 yo q 2 years. Screening is recommended until 39 years old but some women can continue screening depending of healthy issues.  Colon cancer screening: Has been recently changed to 39 yo. Insurance may not cover until you are 39 years old. Screening is recommended  until 39 years old.  Cholesterol disorder screening at age 61 and every 3 years.  Also recommended:  1. Dental visit- Brush and floss your teeth twice daily; visit your dentist twice a year. 2. Eye doctor- Get an eye exam at least every 2 years. 3. Helmet use- Always wear a helmet when riding a bicycle, motorcycle, rollerblading or skateboarding. 4. Safe sex- If you may be exposed to sexually transmitted infections, use a condom. 5. Seat belts- Seat belts can save your live; always wear one. 6. Smoke/Carbon Monoxide detectors- These detectors need to be installed on the appropriate level of your home. Replace batteries at least once a year. 7. Skin cancer- When out in the sun please cover up and use sunscreen 15 SPF or higher. 8. Violence- If anyone is threatening or hurting you, please tell your healthcare provider.  9. Drink alcohol in moderation- Limit alcohol intake to one drink or less per day. Never drink and drive. 10. Calcium supplementation 1000 to 1200 mg daily, ideally through your diet.  Vitamin D supplementation 800 units daily.

## 2020-03-15 LAB — BASIC METABOLIC PANEL WITH GFR
BUN: 14 mg/dL (ref 7–25)
CO2: 26 mmol/L (ref 20–32)
Calcium: 9 mg/dL (ref 8.6–10.2)
Chloride: 107 mmol/L (ref 98–110)
Creat: 0.74 mg/dL (ref 0.50–1.10)
GFR, Est African American: 118 mL/min/{1.73_m2} (ref >=60)
GFR, Est Non African American: 102 mL/min/{1.73_m2} (ref >=60)
Glucose, Bld: 84 mg/dL (ref 65–99)
Potassium: 3.4 mmol/L — ABNORMAL LOW (ref 3.5–5.3)
Sodium: 140 mmol/L (ref 135–146)

## 2020-03-15 LAB — VITAMIN D 25 HYDROXY (VIT D DEFICIENCY, FRACTURES): Vit D, 25-Hydroxy: 18 ng/mL — ABNORMAL LOW (ref 30–100)

## 2020-03-16 LAB — CYTOLOGY - PAP
Comment: NEGATIVE
Diagnosis: NEGATIVE
High risk HPV: NEGATIVE

## 2020-05-05 ENCOUNTER — Telehealth: Payer: Medicaid Other | Admitting: Family

## 2020-05-05 DIAGNOSIS — J019 Acute sinusitis, unspecified: Secondary | ICD-10-CM

## 2020-05-05 MED ORDER — AMOXICILLIN-POT CLAVULANATE 875-125 MG PO TABS: 1.0000 | ORAL_TABLET | Freq: Two times a day (BID) | ORAL | 0 refills | Status: AC

## 2020-05-05 NOTE — Progress Notes (Signed)
We are sorry that you are not feeling well.  Here is how we plan to help!  Based on what you have shared with me it looks like you have sinusitis.  Sinusitis is inflammation and infection in the sinus cavities of the head.  Based on your presentation I believe you most likely have Acute Bacterial Sinusitis.  This is an infection caused by bacteria and is treated with antibiotics. I have prescribed Augmentin 875mg /125mg  one tablet twice daily with food, for 7 days. You may use an oral decongestant such as Mucinex D or if you have glaucoma or high blood pressure use plain Mucinex. Saline nasal spray help and can safely be used as often as needed for congestion.  If you develop worsening sinus pain, fever or notice severe headache and vision changes, or if symptoms are not better after completion of antibiotic, please schedule an appointment with a health care provider.    Sinus infections are not as easily transmitted as other respiratory infection, however we still recommend that you avoid close contact with loved ones, especially the very young and elderly.  Remember to wash your hands thoroughly throughout the day as this is the number one way to prevent the spread of infection!  Home Care:  Only take medications as instructed by your medical team.  Complete the entire course of an antibiotic.  Do not take these medications with alcohol.  A steam or ultrasonic humidifier can help congestion.  You can place a towel over your head and breathe in the steam from hot water coming from a faucet.  Avoid close contacts especially the very young and the elderly.  Cover your mouth when you cough or sneeze.  Always remember to wash your hands.  Get Help Right Away If:  You develop worsening fever or sinus pain.  You develop a severe head ache or visual changes.  Your symptoms persist after you have completed your treatment plan.  Make sure you  Understand these instructions.  Will watch your  condition.  Will get help right away if you are not doing well or get worse.  Your e-visit answers were reviewed by a board certified advanced clinical practitioner to complete your personal care plan.  Depending on the condition, your plan could have included both over the counter or prescription medications.  If there is a problem please reply  once you have received a response from your provider.  Your safety is important to [place].  If you have drug allergies check your prescription carefully.    You can use MyChart to ask questions about todays visit, request a non-urgent call back, or ask for a work or school excuse for 24 hours related to this e-Visit. If it has been greater than 24 hours you will need to follow up with your provider, or enter a new e-Visit to address those concerns.  You will get an e-mail in the next two days asking about your experience.  I hope that your e-visit has been valuable and will speed your recovery. Thank you for using e-visits.  Greater than 5 minutes, yet less than 10 minutes of time have been spent researching, coordinating, and implementing care for this patient.

## 2020-05-18 ENCOUNTER — Other Ambulatory Visit: Payer: Self-pay

## 2020-05-18 ENCOUNTER — Ambulatory Visit (INDEPENDENT_AMBULATORY_CARE_PROVIDER_SITE_OTHER): Payer: Medicaid Other | Admitting: Family Medicine

## 2020-05-18 ENCOUNTER — Encounter: Payer: Self-pay | Admitting: Family Medicine

## 2020-05-18 VITALS — BP 116/80 | HR 81 | Temp 98.6°F | Resp 16 | Ht 65.0 in | Wt 293.4 lb

## 2020-05-18 DIAGNOSIS — E559 Vitamin D deficiency, unspecified: Secondary | ICD-10-CM | POA: Insufficient documentation

## 2020-05-18 DIAGNOSIS — E785 Hyperlipidemia, unspecified: Secondary | ICD-10-CM

## 2020-05-18 DIAGNOSIS — H6981 Other specified disorders of Eustachian tube, right ear: Secondary | ICD-10-CM

## 2020-05-18 DIAGNOSIS — E876 Hypokalemia: Secondary | ICD-10-CM

## 2020-05-18 DIAGNOSIS — H905 Unspecified sensorineural hearing loss: Secondary | ICD-10-CM

## 2020-05-18 DIAGNOSIS — F330 Major depressive disorder, recurrent, mild: Secondary | ICD-10-CM | POA: Diagnosis not present

## 2020-05-18 DIAGNOSIS — H9190 Unspecified hearing loss, unspecified ear: Secondary | ICD-10-CM

## 2020-05-18 DIAGNOSIS — Z131 Encounter for screening for diabetes mellitus: Secondary | ICD-10-CM

## 2020-05-18 MED ORDER — FLUTICASONE PROPIONATE 50 MCG/ACT NA SUSP: 1.0000 | Freq: Two times a day (BID) | NASAL | 0 refills | Status: DC

## 2020-05-18 NOTE — Assessment & Plan Note (Addendum)
Following with psychiatrist. 

## 2020-05-18 NOTE — Assessment & Plan Note (Signed)
Continue nonpharmacologic treatment. Further recommendation will be given according to lipid panel results.

## 2020-05-18 NOTE — Patient Instructions (Addendum)
A few things to remember from today's visit:   Conductive hearing loss of right ear, unspecified hearing status on contralateral side  Dysfunction of right eustachian tube - Plan: fluticasone (FLONASE) 50 MCG/ACT nasal spray  Vitamin D deficiency, unspecified  Hypokalemia - Plan: Potassium  Hyperlipidemia, unspecified hyperlipidemia type - Plan: Lipid panel  Diabetes mellitus screening - Plan: Hemoglobin A1c  Sudafed 12 hours daily in the morning for 10-14 days may help. Continue trying to pop ear, genteelly. Flonase nasal spray for 2 weeks and plain Mucinex may also help with ear.  If ear if not better,ear doctor may need to be considered, so let me know in 3 weeks.  Vit D 2000 U daily.  Please be sure medication list is accurate. If a new problem present, please set up appointment sooner than planned today.

## 2020-05-18 NOTE — Assessment & Plan Note (Signed)
We discussed benefits of weight loss. Encouraged consistency with following a healthful diet that engaging in regular physical activity.

## 2020-05-18 NOTE — Assessment & Plan Note (Signed)
Recommend OTC vitamin D 5000 units daily for 2 weeks and then 2000 units daily.

## 2020-05-18 NOTE — Assessment & Plan Note (Signed)
Continue potassium rich diet for now. Further recommendation will be given according to lab results.

## 2020-05-18 NOTE — Progress Notes (Signed)
HPI: [name] [name] is a 39 y.o. female, who is here today for follow up.   She was last seen on 03/14/2020 for her CPE. She has a few concerns today.  Since her last visit she has followed with psychiatrist for anxiety and depression.  Hyperlipidemia: Currently she is not on pharmacologic treatment. TG have been elevated a few years ago.  Lab Results  Component Value Date   CHOL 190 06/18/2017   HDL 48.20 06/18/2017   LDLCALC 113 (H) 06/18/2017   TRIG 145.0 06/18/2017   CHOLHDL 4 06/18/2017   She is not exercising regularly but she is now work full time and more active. She is trying to eat more regularly. Eating take out from different places in town. Last night she cooked dinner: Broccoli ,rice,and hamburger patties with anion and green beans. . She has not increase vit D supplementation,still taking 1000 U daily. 25 OH vit D was low at 18 on 03/14/20.  HypoK+: She has not noted palpitations,abdominal pain,N/V,or tremors.  Lab Results  Component Value Date   CREATININE 0.74 03/14/2020   BUN 14 03/14/2020   NA 140 03/14/2020   K 3.4 (L) 03/14/2020   CL 107 03/14/2020   CO2 26 03/14/2020   Lab Results  Component Value Date   HGBA1C 5.5 08/08/2007   Right ear fullness sensation,decreased hearing.  Residual after sinus infection, all other symptoms resolved.  Cannot "pop" ear. Negative for fever,chills,ear drainage,sore throat,or nasal congestion.  Review of Systems  Constitutional: Negative for activity change, appetite change and fatigue.  HENT: Negative for mouth sores and nosebleeds.   Eyes: Negative for redness and visual disturbance.  Respiratory: Negative for cough, shortness of breath and wheezing.   Cardiovascular: Negative for chest pain, palpitations and leg swelling.  Gastrointestinal:       Negative for changes in bowel habits.  Genitourinary: Negative for decreased urine volume and hematuria.  Neurological: Negative for syncope, weakness  and headaches.  Rest of ROS, see pertinent positives sand negatives in HPI  Current Outpatient Medications on File Prior to Visit  Medication Sig Dispense Refill  . clonazePAM (KLONOPIN) 0.5 MG tablet Take 1 tablet (0.5 mg total) by mouth daily as needed for anxiety. 30 tablet 2  . ibuprofen (ADVIL,MOTRIN) 200 MG tablet Take 400 mg by mouth every 6 (six) hours as needed for headache or moderate pain.    . magnesium oxide (MAG-OX) 400 (241.3 Mg) MG tablet Take 1 tablet (400 mg total) by mouth 2 (two) times daily. 4 tablet 0  . norgestimate-ethinyl estradiol (SPRINTEC 28) 0.25-35 MG-MCG tablet Take 1 tablet by mouth daily. 84 tablet 3  . SUMAtriptan (IMITREX) 100 MG tablet Take 1 tablet earliest onset of migraine.  May repeat in 2 hours if headache persists or recurs.  Maximum 2 tablets in 24hrs 10 tablet 3  . topiramate (TOPAMAX) 100 MG tablet TAKE 1 TABLET BY MOUTH AT BEDTIME 90 tablet 3  . TRINTELLIX 5 MG TABS tablet Take 5 mg by mouth daily.    . vitamin B-12 1000 MCG tablet Take 1 tablet (1,000 mcg total) by mouth daily. 30 tablet 0  . Vitamin D, Ergocalciferol, (DRISDOL) 1.25 MG (50000 UT) CAPS capsule 1 caps weekly x 8 and then continue q 2 weeks. 12 capsule 0  . zolpidem (AMBIEN) 5 MG tablet Take 1 tablet (5 mg total) by mouth at bedtime as needed for sleep. (Patient taking differently: Take 5 mg by mouth at bedtime as needed for sleep.  Has not started) 30 tablet 2   No current facility-administered medications on file prior to visit.     Past Medical History:  Diagnosis Date  . Anxiety   . Asthma    childhood exercise induced only  . 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 Cholecystitis chronic, acute   . Depression   . Gallstones   . Headache(784.0)    migraines  . Heart murmur    no indications for 7-30yrs per pt.   Allergies  Allergen Reactions  . Other Swelling    Itchy, swelling, hot to the touch     Social History   Socioeconomic History  . Marital status: Single     Spouse name: Not on file  . Number of children: 3  . Years of education: Not on file  . Highest education level: Associate degree: academic program  Occupational History  . Occupation: [employer]  Tobacco Use  . Smoking status: Never Smoker  . Smokeless tobacco: Never Used  Vaping Use  . Vaping Use: Never used  Substance and Sexual Activity  . Alcohol use: Yes    Comment: occassional  . Drug use: No  . Sexual activity: Yes    Partners: Male    Birth control/protection: Pill  Other Topics Concern  . Not on file  Social History Narrative   Single, lives with significant other [name])   #3 children: ages 13-11-2 1/2 months   Employer: [name]   Independent ADLs      Patient is right-handed. She lives with her boy friend.   One level home    Caffeine - 20 oz of soda or tea a day   Social Determinants of Health   Financial Resource Strain: Not on file  Food Insecurity: Not on file  Transportation Needs: Not on file  Physical Activity: Not on file  Stress: Not on file  Social Connections: Not on file   Vitals:   05/18/20 0816  BP: 116/80  Pulse: 81  Resp: 16  Temp: 98.6 F (37 C)  SpO2: 96%   Body mass index is 48.82 kg/m.  Physical Exam Vitals and nursing note reviewed.  Constitutional:      General: She is not in acute distress.    Appearance: She is well-developed.  HENT:     Head: Normocephalic and atraumatic.     Right Ear: No tenderness. A middle ear effusion is present. No mastoid tenderness.     Ears:     Comments: Hearing grossly intact, bilateral.    Nose:     Right Turbinates: Enlarged.     Left Turbinates: Enlarged.     Mouth/Throat:     Mouth: Oropharynx is clear and moist and mucous membranes are normal. Mucous membranes are moist.     Pharynx: Oropharynx is clear.  Eyes:     Conjunctiva/sclera: Conjunctivae normal.     Pupils: Pupils are equal, round, and reactive to light.  Cardiovascular:     Rate and Rhythm: Normal rate and regular  rhythm.     Pulses:          Dorsalis pedis pulses are 2+ on the right side and 2+ on the left side.     Heart sounds: No murmur heard.   Pulmonary:     Effort: Pulmonary effort is normal. No respiratory distress.     Breath sounds: Normal breath sounds.  Abdominal:     Palpations: Abdomen is soft. There is no hepatomegaly or mass.  Tenderness: There is no abdominal tenderness.  Musculoskeletal:        General: No edema.  Lymphadenopathy:     Cervical: No cervical adenopathy.  Skin:    General: Skin is warm.     Findings: No erythema or rash.  Neurological:     Mental Status: She is alert and oriented to person, place, and time.     Cranial Nerves: No cranial nerve deficit.     Gait: Gait normal.     Deep Tendon Reflexes: Strength normal.  Psychiatric:        Mood and Affect: Mood and affect normal.     Comments: Well groomed, good eye contact.    ASSESSMENT AND PLAN:  [name] [name] was seen today for follow-up.  Orders Placed This Encounter  Procedures  . Potassium  . Lipid panel  . Hemoglobin A1c   Lab Results  Component Value Date   HGBA1C 5.7 (H) 05/18/2020   Lab Results  Component Value Date   CHOL 176 05/18/2020   HDL 50 05/18/2020   LDLCALC 107 (H) 05/18/2020   TRIG 101 05/18/2020   CHOLHDL 3.5 05/18/2020   Perceived hearing loss We discussed possible etiologies of hearing loss. Hearing screening in normal range.   Hearing Screening   125Hz  250Hz  500Hz  1000Hz  2000Hz  3000Hz  4000Hz  6000Hz  8000Hz   Right ear:   Fail Fail Pass  Pass    Left ear:   Pass Pass Pass  Pass      Dysfunction of right eustachian tube Auto inflation maneuvers a few times thought the day. Flonase nasal spray may help as well as short course of decongestants, some side effects discussed. In not greatly improved in a couple week, ENT evaluation will be recommended.  -     fluticasone (FLONASE) 50 MCG/ACT nasal spray; Place 1 spray into both nostrils 2 (two) times  daily.  Diabetes mellitus screening -     Hemoglobin A1c  Mild episode of recurrent major depressive disorder [facility]) Following with psychiatrist.  Hypokalemia Continue potassium rich diet for now. Further recommendation will be given according to lab results.  Morbid obesity ([place]) We discussed benefits of weight loss. Encouraged consistency with following a healthful diet that engaging in regular physical activity.  Hyperlipidemia Continue nonpharmacologic treatment. Further recommendation will be given according to lipid panel results.  Vitamin D deficiency, unspecified Recommend OTC vitamin D 5000 units daily for 2 weeks and then 2000 units daily.  Spent 43 minutes.  During this time history was obtained and documented, examination was performed, prior labs reviewed, and assessment/plan discussed.  Return in about 1 year (around 05/18/2021) for cpe.  Mykalah Saari G. Martinique, MD  [facility]. [place] office.   A few things to remember from today's visit:   Conductive hearing loss of right ear, unspecified hearing status on contralateral side  Dysfunction of right eustachian tube - Plan: fluticasone (FLONASE) 50 MCG/ACT nasal spray  Vitamin D deficiency, unspecified  Hypokalemia - Plan: Potassium  Hyperlipidemia, unspecified hyperlipidemia type - Plan: Lipid panel  Diabetes mellitus screening - Plan: Hemoglobin A1c  Sudafed 12 hours daily in the morning for 10-14 days may help. Continue trying to pop ear, genteelly. Flonase nasal spray for 2 weeks and plain Mucinex may also help with ear.  If ear if not better,ear doctor may need to be considered, so let me know in 3 weeks.  Vit D 2000 U daily.  Please be sure medication list is accurate. If a new problem present, please  set up appointment sooner than planned today.

## 2020-05-19 LAB — LIPID PANEL
Cholesterol: 176 mg/dL (ref <200)
HDL: 50 mg/dL (ref >=50)
LDL Cholesterol (Calc): 107 mg/dL (calc) — ABNORMAL HIGH
Non-HDL Cholesterol (Calc): 126 mg/dL (calc) (ref <130)
Total CHOL/HDL Ratio: 3.5 (calc) (ref <5.0)
Triglycerides: 101 mg/dL (ref <150)

## 2020-05-19 LAB — HEMOGLOBIN A1C
Hgb A1c MFr Bld: 5.7 % of total Hgb — ABNORMAL HIGH (ref <5.7)
Mean Plasma Glucose: 117 mg/dL
eAG (mmol/L): 6.5 mmol/L

## 2020-05-19 LAB — POTASSIUM: Potassium: 4.2 mmol/L (ref 3.5–5.3)

## 2020-05-31 NOTE — Progress Notes (Deleted)
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  UPDATE: Intensity:Moderate to severe Duration:Usually an hour without sumatriptan.  For severe migraines, improves after an hour of taking sumatriptan and lasts 3 to 4 hours. Frequency:3 days a month (1 severe) Frequency of abortive medication:3 times Current NSAIDS:none Current analgesics:Tylenol, Current triptans:sumatriptan 100mg  Current ergotamine:none Current anti-emetic:none Current muscle relaxants:baclofen Current anti-anxiolytic:clonazepam Current sleep aide:none Current Antihypertensive medications:none Current Antidepressant medications:Trintellix Current Anticonvulsant medications:Topirmate100mg  at bedtime Current anti-CGRP:none Current Vitamins/Herbal/Supplements:B12; D Current Antihistamines/Decongestants:none Other therapy:none Hormone/birth control:Sprintec   Caffeine:No coffee.Cut back on soda and sweet tea (12 oz a day). Alcohol:Rarely (once every 6 months) Diet:Drinks Coke or Spritebut has cut back to 12 oz a day.Increased water intake (64 oz of water) Exercise:No Depression:stable; Anxiety:some Other pain:no Sleep hygiene:Poor. Difficulty falling asleep  HISTORY: Onset: Around puberty.Gradually getting worse over the years.She was seen in the Urgent Care on 4/10/20for an intractable migraine that didn't respond to OTC or oxycodone that lasted a week, where she received a headache cocktail of Toradol 30mg , Compazine 10mg  and Benadryl 25mg which took the edge off but did not break it. Location:Left frontal and temporal region as well as a tenderness on the back of her head. Quality:stabbing Initial intensity:6-8/10. Shedenies new  headache, thunderclap headache Aura:no Premonitory Phase:no Postdrome:Feels off-kilter Associated symptoms:Nausea, photophobia, phonophobia, sometimes vomiting.Shedenies associated visual disturbance, autonomic symptoms orunilateral numbness or weakness. InitialDuration:Usually last 2 days InitialFrequency:Severe migraines occurs 2-3 days prior to onset of menses. More moderate headaches occur in between. 5 days a month. InitialFrequency of abortive medication:5 days a month Triggers: Hormonal, emotional stress, lack of sleep Relieving factors: Cool wash cloth, lay down Activity:Aggravates  Past NSAIDS:Ibuprofen, naproxen Past analgesics:Excedrin Migraine Past abortive triptans:  Past abortive ergotamine:none Past muscle relaxants:Flexeril Past anti-emetic:promethazine; Zofran ODT 4mg  Past antihypertensive medications:none Past antidepressant medications:Amitriptyline, Effexor XR 75mg , Celexa, Prozac Past anticonvulsant medications:none Past anti-CGRP:none Past vitamins/Herbal/Supplements:none Past antihistamines/decongestants:none Other past therapies:none  Family history of headache:Mother, brother  PAST MEDICAL HISTORY: Past Medical History:  Diagnosis Date  . Anxiety   . Asthma    childhood exercise induced only  . 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 Cholecystitis chronic, acute   . Depression   . Gallstones   . Headache(784.0)    migraines  . Heart murmur    no indications for 7-68yrs per pt.    MEDICATIONS: Current Outpatient Medications on File Prior to Visit  Medication Sig Dispense Refill  . clonazePAM (KLONOPIN) 0.5 MG tablet Take 1 tablet (0.5 mg total) by mouth daily as needed for anxiety. 30 tablet 2  . fluticasone (FLONASE) 50 MCG/ACT nasal spray Place 1 spray into both nostrils 2 (two) times daily. 16 g 0  . ibuprofen (ADVIL,MOTRIN) 200 MG tablet Take 400 mg by mouth every 6 (six) hours as needed for  headache or moderate pain.    . magnesium oxide (MAG-OX) 400 (241.3 Mg) MG tablet Take 1 tablet (400 mg total) by mouth 2 (two) times daily. 4 tablet 0  . norgestimate-ethinyl estradiol (SPRINTEC 28) 0.25-35 MG-MCG tablet Take 1 tablet by mouth daily. 84 tablet 3  . SUMAtriptan (IMITREX) 100 MG tablet Take 1 tablet earliest onset of migraine.  May repeat in 2 hours if headache persists or recurs.  Maximum 2 tablets in 24hrs 10 tablet 3  . topiramate (TOPAMAX) 100 MG tablet TAKE 1 TABLET BY  MOUTH AT BEDTIME 90 tablet 3  . TRINTELLIX 5 MG TABS tablet Take 5 mg by mouth daily.    . vitamin B-12 1000 MCG tablet Take 1 tablet (1,000 mcg total) by mouth daily. 30 tablet 0  . zolpidem (AMBIEN) 5 MG tablet Take 1 tablet (5 mg total) by mouth at bedtime as needed for sleep. (Patient taking differently: Take 5 mg by mouth at bedtime as needed for sleep. Has not started) 30 tablet 2   No current facility-administered medications on file prior to visit.    ALLERGIES: Allergies  Allergen Reactions  . Other Swelling    Itchy, swelling, hot to the touch     FAMILY HISTORY: Family History  Problem Relation Age of Onset  . Cancer Father        Leukemia  . ADD / ADHD Other        family hx  . Breast cancer Other        fhx  . Mental illness Other        fhx  . Heart disease Other        fhx  . Hypertension Other        fhx  . Heart defect Brother     SOCIAL HISTORY: Social History   Socioeconomic History  . Marital status: Single    Spouse name: Not on file  . Number of children: 3  . Years of education: Not on file  . Highest education level: Associate degree: academic program  Occupational History  . Occupation: [employer]  Tobacco Use  . Smoking status: Never Smoker  . Smokeless tobacco: Never Used  Vaping Use  . Vaping Use: Never used  Substance and Sexual Activity  . Alcohol use: Yes    Comment: occassional  . Drug use: No  . Sexual activity: Yes    Partners: Male    Birth  control/protection: Pill  Other Topics Concern  . Not on file  Social History Narrative   Single, lives with significant other Jomarie Longs)   #3 children: ages 13-11-2 1/2 months   Employer: [employer]   Independent ADLs      Patient is right-handed. She lives with her boy friend.   One level home    Caffeine - 20 oz of soda or tea a day   Social Determinants of Health   Financial Resource Strain: Not on file  Food Insecurity: Not on file  Transportation Needs: Not on file  Physical Activity: Not on file  Stress: Not on file  Social Connections: Not on file  Intimate Partner Violence: Not on file     Objective:  *** General: No acute distress.  Patient appears well-groomed.   Head:  Normocephalic/atraumatic Eyes:  Fundi examined but not visualized Neck: supple, no paraspinal tenderness, full range of motion Heart:  Regular rate and rhythm Lungs:  Clear to auscultation bilaterally Back: No paraspinal tenderness Neurological Exam: alert and oriented to person, place, and time. Attention span and concentration intact, recent and remote memory intact, fund of knowledge intact.  Speech fluent and not dysarthric, language intact.  CN II-XII intact. Bulk and tone normal, muscle strength 5/5 throughout.  Sensation to light touch, temperature and vibration intact.  Deep tendon reflexes 2+ throughout, toes downgoing.  Finger to nose and heel to shin testing intact.  Gait normal, Romberg negative.   Assessment/Plan:   Menstrually related migraine, without status migrainosus, not intractable  1.  Migraine prevention:  topiramate 100mg  at bedtime 2.  Migraine  rescue:  Sumatriptan 100mg  3.  Limit use of pain relievers to no more than 2 days out of week to prevent risk of rebound or medication-overuse headache. 4.  Keep headache diary 5.  Exercise, hydration, caffeine cessation, sleep hygiene, monitor for and avoid triggers 6.  Follow up ***   Metta Clines, DO  CC: Betty Martinique,  MD

## 2020-06-02 ENCOUNTER — Ambulatory Visit: Payer: Medicaid Other | Admitting: Neurology

## 2020-08-06 ENCOUNTER — Encounter: Payer: Self-pay | Admitting: Physician Assistant

## 2020-08-06 ENCOUNTER — Telehealth: Payer: Medicaid Other | Admitting: Physician Assistant

## 2020-08-06 DIAGNOSIS — J018 Other acute sinusitis: Secondary | ICD-10-CM

## 2020-08-06 MED ORDER — IPRATROPIUM BROMIDE 0.03 % NA SOLN: 2.0000 | Freq: Two times a day (BID) | NASAL | 0 refills | Status: DC

## 2020-08-06 NOTE — Progress Notes (Signed)
We are sorry that you are not feeling well.  Here is how we plan to help!  Based on what you have shared with me it looks like you have sinusitis.  Sinusitis is inflammation and infection in the sinus cavities of the head.  Based on your presentation I believe you most likely have Acute Viral Sinusitis.This is an infection most likely caused by a virus. There is not specific treatment for viral sinusitis other than to help you with the symptoms until the infection runs its course.  You may use an oral decongestant such as Mucinex D or if you have glaucoma or high blood pressure use plain Mucinex. Saline nasal spray help and can safely be used as often as needed for congestion, I have prescribed: Ipratropium Bromide nasal spray 0.03% 2 sprays in eah nostril 2-3 times a day  Some authorities believe that zinc sprays or the use of Echinacea may shorten the course of your symptoms.  Sinus infections are not as easily transmitted as other respiratory infection, however we still recommend that you avoid close contact with loved ones, especially the very young and elderly.  Remember to wash your hands thoroughly throughout the day as this is the number one way to prevent the spread of infection!  Home Care:  Only take medications as instructed by your medical team.  Do not take these medications with alcohol.  A steam or ultrasonic humidifier can help congestion.  You can place a towel over your head and breathe in the steam from hot water coming from a faucet.  Avoid close contacts especially the very young and the elderly.  Cover your mouth when you cough or sneeze.  Always remember to wash your hands.  Get Help Right Away If:  You develop worsening fever or sinus pain.  You develop a severe head ache or visual changes.  Your symptoms persist after you have completed your treatment plan.  Make sure you  Understand these instructions.  Will watch your condition.  Will get help right  away if you are not doing well or get worse.  Your e-visit answers were reviewed by a board certified advanced clinical practitioner to complete your personal care plan.  Depending on the condition, your plan could have included both over the counter or prescription medications.  If there is a problem please reply  once you have received a response from your provider.  Your safety is important to [place].  If you have drug allergies check your prescription carefully.    You can use MyChart to ask questions about today's visit, request a non-urgent call back, or ask for a work or school excuse for 24 hours related to this e-Visit. If it has been greater than 24 hours you will need to follow up with your provider, or enter a new e-Visit to address those concerns.  You will get an e-mail in the next two days asking about your experience.  I hope that your e-visit has been valuable and will speed your recovery. Thank you for using e-visits.   I spent 5-10 minutes on review and completion of this note- Lacy Duverney [facility]

## 2020-10-26 DIAGNOSIS — M545 Low back pain, unspecified: Secondary | ICD-10-CM | POA: Diagnosis not present

## 2021-01-27 ENCOUNTER — Other Ambulatory Visit: Payer: Self-pay

## 2021-01-27 ENCOUNTER — Encounter: Payer: Medicaid Other | Admitting: Family Medicine

## 2021-01-27 MED ORDER — NORGESTIMATE-ETH ESTRADIOL 0.25-35 MG-MCG PO TABS
1.0000 | ORAL_TABLET | Freq: Every day | ORAL | 1 refills | Status: DC
Start: 2021-01-27 — End: 2021-07-17

## 2021-05-22 ENCOUNTER — Encounter: Payer: Medicaid Other | Admitting: Family Medicine

## 2021-06-12 ENCOUNTER — Telehealth: Payer: Medicaid Other | Admitting: Physician Assistant

## 2021-06-12 DIAGNOSIS — R3989 Other symptoms and signs involving the genitourinary system: Secondary | ICD-10-CM

## 2021-06-12 MED ORDER — SULFAMETHOXAZOLE-TRIMETHOPRIM 800-160 MG PO TABS: 1.0000 | ORAL_TABLET | Freq: Two times a day (BID) | ORAL | 0 refills | Status: DC

## 2021-06-12 NOTE — Progress Notes (Signed)

## 2021-07-14 ENCOUNTER — Other Ambulatory Visit: Payer: Self-pay | Admitting: Family Medicine

## 2021-07-14 DIAGNOSIS — Z30011 Encounter for initial prescription of contraceptive pills: Secondary | ICD-10-CM

## 2021-07-17 MED ORDER — NORGESTIMATE-ETH ESTRADIOL 0.25-35 MG-MCG PO TABS: 1.0000 | ORAL_TABLET | Freq: Every day | ORAL | 0 refills | Status: DC

## 2021-07-17 NOTE — Addendum Note (Signed)
Addended by: Rodrigo Ran on: 07/17/2021 08:25 AM   Modules accepted: Orders

## 2021-08-08 NOTE — Progress Notes (Signed)
HPI: [name] is a 41 y.o. female, who is here today for her routine physical. Last CPE: 2021 No new problems since her last visit.  Regular exercise: No Following a healthful diet: Not consistently, eating out most of the time.  Chronic medical problems: Anxiety,depression,migraine headaches,vit D def,and abnormal papsmears amonth some.  Immunization History  Administered Date(s) Administered   Influenza Split 01/06/2014   Influenza,inj,Quad PF,6+ Mos 02/18/2017, 03/07/2018, 03/14/2020   Rho (D) Immune Globulin 11/04/2012   Td 08/08/2007   Tdap 11/25/2017   Health Maintenance  Topic Date Due   Hepatitis C Screening  Never done   COVID-19 Vaccine (1) 08/25/2021 (Originally 07/02/1981)   INFLUENZA VACCINE  09/01/2021 (Originally 01/02/2021)   PAP SMEAR-Modifier  03/15/2023   TETANUS/TDAP  11/26/2027   HIV Screening  Completed   HPV VACCINES  Aged Out   03/14/20: High risk HPV Negative   Adequacy Satisfactory for evaluation; transformation zone component PRESENT.   Diagnosis - Negative for intraepithelial lesion or malignancy (NILM)   Comment Normal Reference Range HPV - Negative   LMP 3 weeks ago. Hx of HPV during pregnancy. G: 3 L: 3  She has no new concerns today.  Hyperlipidemia: Currently she is not on pharmacologic treatment. Lab Results  Component Value Date   CHOL 176 05/18/2020   HDL 50 05/18/2020   LDLCALC 107 (H) 05/18/2020   TRIG 101 05/18/2020   CHOLHDL 3.5 05/18/2020   Vit D def: Last 25 OH vit D was low at 18 in 03/2020. She is not taking vit D supplementation.  Lab Results  Component Value Date   HGBA1C 5.7 (H) 05/18/2020   She is not longer following with psychiatrist, stopped all her meds. She feels "better" in general, dealing well with stress, mainly work related.  Review of Systems  Constitutional:  Positive for fatigue. Negative for appetite change and fever.  HENT:  Negative for hearing loss, mouth sores, trouble swallowing and  voice change.   Eyes:  Negative for photophobia and visual disturbance.  Respiratory:  Negative for cough, shortness of breath and wheezing.   Cardiovascular:  Negative for chest pain and leg swelling.  Gastrointestinal:  Negative for abdominal pain, nausea and vomiting.       No changes in bowel habits.  Endocrine: Negative for cold intolerance, heat intolerance, polydipsia, polyphagia and polyuria.  Genitourinary:  Negative for decreased urine volume, dysuria, hematuria, vaginal bleeding and vaginal discharge.  Musculoskeletal:  Negative for gait problem and myalgias.  Skin:  Negative for color change and rash.  Allergic/Immunologic: Positive for environmental allergies.  Neurological:  Positive for headaches (Have improved since on OCP, 1 since 01/2021.). Negative for syncope and weakness.  Hematological:  Negative for adenopathy. Does not bruise/bleed easily.  Psychiatric/Behavioral:  Positive for sleep disturbance (Wakes up a few times per night.). Negative for confusion.   All other systems reviewed and are negative.  Current Outpatient Medications on File Prior to Visit  Medication Sig Dispense Refill   ibuprofen (ADVIL,MOTRIN) 200 MG tablet Take 400 mg by mouth every 6 (six) hours as needed for headache or moderate pain.     magnesium oxide (MAG-OX) 400 (241.3 Mg) MG tablet Take 1 tablet (400 mg total) by mouth 2 (two) times daily. 4 tablet 0   SUMAtriptan (IMITREX) 100 MG tablet Take 1 tablet earliest onset of migraine.  May repeat in 2 hours if headache persists or recurs.  Maximum 2 tablets in 24hrs 10 tablet 3   clonazePAM (KLONOPIN) 0.5 MG  tablet Take 1 tablet (0.5 mg total) by mouth daily as needed for anxiety. 30 tablet 2   No current facility-administered medications on file prior to visit.   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    Past Surgical History:  Procedure Laterality Date   APPENDECTOMY     CESAREAN SECTION N/A 11/03/2012   Procedure: PRIMARY CESAREAN SECTION;  Surgeon: TMelina Schools MD;  [facility];  Service: Obstetrics;  Laterality: N/A;   CHOLECYSTECTOMY N/A 12/23/2012   Procedure: LAPAROSCOPIC CHOLECYSTECTOMY WITH INTRAOPERATIVE CHOLANGIOGRAM;  Surgeon: MPedro Earls MD;  [facility];  Service: General;  Laterality: N/A;  Laparoscopic Cholecystectomy with IOC   COLON SURGERY     ERCP N/A 12/22/2012   Procedure: ENDOSCOPIC RETROGRADE CHOLANGIOPANCREATOGRAPHY (ERCP);  Surgeon: CGatha Mayer MD;  [facility];  Service: Endoscopy;  Laterality: N/A;   INSERTION OF MESH N/A 07/15/2017   Procedure: INSERTION OF MESH;  Surgeon: TJovita Kussmaul MD;  [place] [facility]  Service: General;  Laterality: N/A;   LAPAROTOMY N/A 11/08/2012   Procedure: laparoscopic exploration and EXPLORATORY LAPAROTOMY;  Surgeon: PMerrie Roof MD;  [facility];  Service: General;  Laterality: N/A;   VENTRAL HERNIA REPAIR N/A 07/15/2017   Procedure: LAPAROSCOPIC ASSISTED VENTRAL HERNIA REPAIR;  Surgeon: TJovita Kussmaul MD;  Location[facility];  Service: General;  Laterality: N/A;    Allergies  Allergen Reactions   Other Swelling    Itchy, swelling, hot to the touch     Family History  Problem Relation Age of Onset   Cancer Father        Leukemia   ADD / ADHD Other        family hx   Breast cancer Other        fhx   Mental illness Other        fhx   Heart disease Other        fhx   Hypertension Other        fhx   Heart defect Brother     Social History   Socioeconomic History   Marital status: Single    Spouse name: Not on file   Number of children: 3   Years of education: Not on file   Highest education level: Associate degree: academic program  Occupational History   Occupation: [employer]  Tobacco  Use   Smoking status: Never   Smokeless tobacco: Never  Vaping Use   Vaping Use: Never used  Substance and Sexual Activity   Alcohol use: Yes    Comment: occassional   Drug use: No   Sexual activity: Yes    Partners: Male    Birth control/protection: Pill  Other Topics Concern   Not on file  Social History Narrative   Single, lives with significant other ([name]   #3 children: ages 13-11-2 1/2 months   Employer: [employer]      Patient is right-handed. She lives with her boy friend.   One level home    Caffeine - 20 oz of soda or tea a day   Social Determinants of Health   Financial  Resource Strain: Not on file  Food Insecurity: Not on file  Transportation Needs: Not on file  Physical Activity: Not on file  Stress: Not on file  Social Connections: Not on file   Vitals:   08/09/21 0705  BP: 126/80  Pulse: 98  Resp: 16  SpO2: 97%   Body mass index is 49.78 kg/m.  Wt Readings from Last 3 Encounters:  08/09/21 299 lb 2 oz (135.7 kg)  05/18/20 293 lb 6.4 oz (133.1 kg)  03/14/20 297 lb (134.7 kg)   Physical Exam Vitals and nursing note reviewed.  Constitutional:      General: She is not in acute distress.    Appearance: She is well-developed.  HENT:     Head: Normocephalic and atraumatic.     Right Ear: Hearing, tympanic membrane, ear canal and external ear normal.     Left Ear: Hearing, tympanic membrane, ear canal and external ear normal.     Mouth/Throat:     Mouth: Mucous membranes are moist.     Pharynx: Oropharynx is clear. Uvula midline.  Eyes:     Extraocular Movements: Extraocular movements intact.     Conjunctiva/sclera: Conjunctivae normal.     Pupils: Pupils are equal, round, and reactive to light.  Neck:     Thyroid: No thyromegaly.     Trachea: No tracheal deviation.  Cardiovascular:     Rate and Rhythm: Normal rate and regular rhythm.     Pulses:          Dorsalis pedis pulses are 2+ on the right side and 2+ on the left  side.     Heart sounds: No murmur heard. Pulmonary:     Effort: Pulmonary effort is normal. No respiratory distress.     Breath sounds: Normal breath sounds.  Abdominal:     Palpations: Abdomen is soft. There is no hepatomegaly or mass.     Tenderness: There is no abdominal tenderness.  Genitourinary:    Comments: No concerns. Musculoskeletal:     Comments: No major deformity or signs of synovitis appreciated.  Lymphadenopathy:     Cervical: No cervical adenopathy.     Upper Body:     Right upper body: No supraclavicular adenopathy.     Left upper body: No supraclavicular adenopathy.  Skin:    General: Skin is warm.     Findings: No erythema or rash.  Neurological:     General: No focal deficit present.     Mental Status: She is alert and oriented to person, place, and time.     Cranial Nerves: No cranial nerve deficit.     Coordination: Coordination normal.     Gait: Gait normal.     Deep Tendon Reflexes:     Reflex Scores:      Bicep reflexes are 2+ on the right side and 2+ on the left side.      Patellar reflexes are 2+ on the right side and 2+ on the left side. Psychiatric:        Speech: Speech normal.     Comments: Well groomed, good eye contact.   ASSESSMENT AND PLAN:  [name] [name] was here today annual physical examination.  Orders Placed This Encounter  Procedures   Mammogram Digital Screening   Comprehensive metabolic panel   Hepatitis C antibody   Lipid panel   VITAMIN D 25 Hydroxy (Vit-D Deficiency, Fractures)   Hemoglobin A1c   Lab Results  Component Value Date   CREATININE 0.56 08/09/2021  BUN 10 08/09/2021   NA 139 08/09/2021   K 3.8 08/09/2021   CL 106 08/09/2021   CO2 25 08/09/2021   Lab Results  Component Value Date   CHOL 159 08/09/2021   HDL 56.00 08/09/2021   LDLCALC 79 08/09/2021   TRIG 120.0 08/09/2021   CHOLHDL 3 08/09/2021   Lab Results  Component Value Date   HGBA1C 5.7 08/09/2021   Lab Results  Component Value  Date   ALT 10 08/09/2021   AST 12 08/09/2021   ALKPHOS 41 08/09/2021   BILITOT 0.3 08/09/2021   Routine general medical examination at a health care facility We discussed the importance of regular physical activity and healthy diet for prevention of chronic illness and/or complications. Preventive guidelines reviewed. Vaccination up-to-date.  Next CPE in a year. The 10-year ASCVD risk score (Arnett DK, et al., 2019) is: 0.3%   Values used to calculate the score:     Age: 23 years     Sex: Female     Is Non-Hispanic African American: No     Diabetic: No     Tobacco smoker: No     Systolic Blood Pressure: 590 mmHg     Is BP treated: No     HDL Cholesterol: 56 mg/dL     Total Cholesterol: 159 mg/dL  Diabetes mellitus screening -     Hemoglobin A1c -     Comprehensive metabolic panel  Encounter for surveillance of contraceptive pills She has tolerated medication well. We will review son side effects.  -     norgestimate-ethinyl estradiol (ESTARYLLA) 0.25-35 MG-MCG tablet; Take 1 tablet by mouth daily.  Encounter for hepatitis C screening test for low risk patient -     Hepatitis C antibody  Visit for screening mammogram -     Mammogram Digital Screening; Future  Vitamin D deficiency, unspecified She is not on vit D supplementation. Further recommendations according to 25 OH vit D result.  Mild episode of recurrent major depressive disorder ([place]) She has decided to stop all medications and no longer following with psychiatrist. At this time she does not feel she needs to be on pharmacologic treatment.  Morbid obesity ([place]) We discussed benefits of wt loss. Consistency with healthy diet and physical activity encouraged.   Hyperlipidemia Non pharmacologic treatment recommended for now. Further recommendations will be given according to 10 years CVD risk score and lipid panel numbers.  Return in 1 year (on 08/10/2022) for CPE and follow up.  Delayni Streed G. Martinique,  MD  [facility]. [place] office.

## 2021-08-09 ENCOUNTER — Ambulatory Visit (INDEPENDENT_AMBULATORY_CARE_PROVIDER_SITE_OTHER): Payer: Medicaid Other | Admitting: Family Medicine

## 2021-08-09 ENCOUNTER — Encounter: Payer: Self-pay | Admitting: Family Medicine

## 2021-08-09 VITALS — BP 126/80 | HR 98 | Resp 16 | Ht 65.0 in | Wt 299.1 lb

## 2021-08-09 DIAGNOSIS — Z0000 Encounter for general adult medical examination without abnormal findings: Secondary | ICD-10-CM

## 2021-08-09 DIAGNOSIS — E559 Vitamin D deficiency, unspecified: Secondary | ICD-10-CM

## 2021-08-09 DIAGNOSIS — Z1231 Encounter for screening mammogram for malignant neoplasm of breast: Secondary | ICD-10-CM | POA: Diagnosis not present

## 2021-08-09 DIAGNOSIS — Z131 Encounter for screening for diabetes mellitus: Secondary | ICD-10-CM | POA: Diagnosis not present

## 2021-08-09 DIAGNOSIS — Z1159 Encounter for screening for other viral diseases: Secondary | ICD-10-CM

## 2021-08-09 DIAGNOSIS — E785 Hyperlipidemia, unspecified: Secondary | ICD-10-CM

## 2021-08-09 DIAGNOSIS — Z3041 Encounter for surveillance of contraceptive pills: Secondary | ICD-10-CM | POA: Diagnosis not present

## 2021-08-09 DIAGNOSIS — Z30011 Encounter for initial prescription of contraceptive pills: Secondary | ICD-10-CM

## 2021-08-09 DIAGNOSIS — F330 Major depressive disorder, recurrent, mild: Secondary | ICD-10-CM

## 2021-08-09 LAB — LIPID PANEL
Cholesterol: 159 mg/dL (ref 0–200)
HDL: 56 mg/dL (ref >39.00)
LDL Cholesterol: 79 mg/dL (ref 0–99)
NonHDL: 103.36
Total CHOL/HDL Ratio: 3
Triglycerides: 120 mg/dL (ref 0.0–149.0)
VLDL: 24 mg/dL (ref 0.0–40.0)

## 2021-08-09 LAB — HEMOGLOBIN A1C: Hgb A1c MFr Bld: 5.7 % (ref 4.6–6.5)

## 2021-08-09 LAB — COMPREHENSIVE METABOLIC PANEL
ALT: 10 U/L (ref 0–35)
AST: 12 U/L (ref 0–37)
Albumin: 3.6 g/dL (ref 3.5–5.2)
Alkaline Phosphatase: 41 U/L (ref 39–117)
BUN: 10 mg/dL (ref 6–23)
CO2: 25 mEq/L (ref 19–32)
Calcium: 8.8 mg/dL (ref 8.4–10.5)
Chloride: 106 mEq/L (ref 96–112)
Creatinine, Ser: 0.56 mg/dL (ref 0.40–1.20)
GFR: 114.01 mL/min (ref >60.00)
Glucose, Bld: 92 mg/dL (ref 70–99)
Potassium: 3.8 mEq/L (ref 3.5–5.1)
Sodium: 139 mEq/L (ref 135–145)
Total Bilirubin: 0.3 mg/dL (ref 0.2–1.2)
Total Protein: 6.7 g/dL (ref 6.0–8.3)

## 2021-08-09 LAB — VITAMIN D 25 HYDROXY (VIT D DEFICIENCY, FRACTURES): VITD: 13.18 ng/mL — ABNORMAL LOW (ref 30.00–100.00)

## 2021-08-09 MED ORDER — NORGESTIMATE-ETH ESTRADIOL 0.25-35 MG-MCG PO TABS: 1.0000 | ORAL_TABLET | Freq: Every day | ORAL | 3 refills | Status: DC

## 2021-08-09 NOTE — Patient Instructions (Addendum)
A few things to remember from today's visit: ? ?Routine general medical examination at a health care facility ? ?Hyperlipidemia, unspecified hyperlipidemia type - Plan: Lipid panel ? ?Diabetes mellitus screening - Plan: Comprehensive metabolic panel, Hemoglobin A1c ? ?Encounter for initial prescription of contraceptive pills - Plan: norgestimate-ethinyl estradiol (ESTARYLLA) 0.25-35 MG-MCG tablet ? ?Vitamin D deficiency, unspecified - Plan: VITAMIN D 25 Hydroxy (Vit-D Deficiency, Fractures) ? ?Encounter for hepatitis C screening test for low risk patient - Plan: Hepatitis C antibody ? ?Visit for screening mammogram - Plan: Mammogram Digital Screening ? ?Do not use My Chart to request refills or for acute issues that need immediate attention. ? ?Please be sure medication list is accurate. ?If a new problem present, please set up appointment sooner than planned today. ? ?Health Maintenance, Female ?Adopting a healthy lifestyle and getting preventive care are important in promoting health and wellness. Ask your health care provider about: ?The right schedule for you to have regular tests and exams. ?Things you can do on your own to prevent diseases and keep yourself healthy. ?What should I know about diet, weight, and exercise? ?Eat a healthy diet ? ?Eat a diet that includes plenty of vegetables, fruits, low-fat dairy products, and lean protein. ?Do not eat a lot of foods that are high in solid fats, added sugars, or sodium. ?Maintain a healthy weight ?Body mass index (BMI) is used to identify weight problems. It estimates body fat based on height and weight. Your health care provider can help determine your BMI and help you achieve or maintain a healthy weight. ?Get regular exercise ?Get regular exercise. This is one of the most important things you can do for your health. Most adults should: ?Exercise for at least 150 minutes each week. The exercise should increase your heart rate and make you sweat  (moderate-intensity exercise). ?Do strengthening exercises at least twice a week. This is in addition to the moderate-intensity exercise. ?Spend less time sitting. Even light physical activity can be beneficial. ?Watch cholesterol and blood lipids ?Have your blood tested for lipids and cholesterol at 41 years of age, then have this test every 5 years. ?Have your cholesterol levels checked more often if: ?Your lipid or cholesterol levels are high. ?You are older than 41 years of age. ?You are at high risk for heart disease. ?What should I know about cancer screening? ?Depending on your health history and family history, you may need to have cancer screening at various ages. This may include screening for: ?Breast cancer. ?Cervical cancer. ?Colorectal cancer. ?Skin cancer. ?Lung cancer. ?What should I know about heart disease, diabetes, and high blood pressure? ?Blood pressure and heart disease ?High blood pressure causes heart disease and increases the risk of stroke. This is more likely to develop in people who have high blood pressure readings or are overweight. ?Have your blood pressure checked: ?Every 3-5 years if you are 72-26 years of age. ?Every year if you are 37 years old or older. ?Diabetes ?Have regular diabetes screenings. This checks your fasting blood sugar level. Have the screening done: ?Once every three years after age 21 if you are at a normal weight and have a low risk for diabetes. ?More often and at a younger age if you are overweight or have a high risk for diabetes. ?What should I know about preventing infection? ?Hepatitis B ?If you have a higher risk for hepatitis B, you should be screened for this virus. Talk with your health care provider to find out if you are  at risk for hepatitis B infection. ?Hepatitis C ?Testing is recommended for: ?Everyone born from [birth year] through 1965. ?Anyone with known risk factors for hepatitis C. ?Sexually transmitted infections (STIs) ?Get screened for STIs,  including gonorrhea and chlamydia, if: ?You are sexually active and are younger than 41 years of age. ?You are older than 41 years of age and your health care provider tells you that you are at risk for this type of infection. ?Your sexual activity has changed since you were last screened, and you are at increased risk for chlamydia or gonorrhea. Ask your health care provider if you are at risk. ?Ask your health care provider about whether you are at high risk for HIV. Your health care provider may recommend a prescription medicine to help prevent HIV infection. If you choose to take medicine to prevent HIV, you should first get tested for HIV. You should then be tested every 3 months for as long as you are taking the medicine. ?Pregnancy ?If you are about to stop having your period (premenopausal) and you may become pregnant, seek counseling before you get pregnant. ?Take 400 to 800 micrograms (mcg) of folic acid every day if you become pregnant. ?Ask for birth control (contraception) if you want to prevent pregnancy. ?Osteoporosis and menopause ?Osteoporosis is a disease in which the bones lose minerals and strength with aging. This can result in bone fractures. If you are 62 years old or older, or if you are at risk for osteoporosis and fractures, ask your health care provider if you should: ?Be screened for bone loss. ?Take a calcium or vitamin D supplement to lower your risk of fractures. ?Be given hormone replacement therapy (HRT) to treat symptoms of menopause. ?Follow these instructions at home: ?Alcohol use ?Do not drink alcohol if: ?Your health care provider tells you not to drink. ?You are pregnant, may be pregnant, or are planning to become pregnant. ?If you drink alcohol: ?Limit how much you have to: ?0-1 drink a day. ?Know how much alcohol is in your drink. In the [place] one drink equals one 12 oz bottle of beer (355 mL), one 5 oz glass of wine (148 mL), or one 1? oz glass of hard liquor (44  mL). ?Lifestyle ?Do not use any products that contain nicotine or tobacco. These products include cigarettes, chewing tobacco, and vaping devices, such as e-cigarettes. If you need help quitting, ask your health care provider. ?Do not use street drugs. ?Do not share needles. ?Ask your health care provider for help if you need support or information about quitting drugs. ?General instructions ?Schedule regular health, dental, and eye exams. ?Stay current with your vaccines. ?Tell your health care provider if: ?You often feel depressed. ?You have ever been abused or do not feel safe at home. ?Summary ?Adopting a healthy lifestyle and getting preventive care are important in promoting health and wellness. ?Follow your health care provider's instructions about healthy diet, exercising, and getting tested or screened for diseases. ?Follow your health care provider's instructions on monitoring your cholesterol and blood pressure. ?This information is not intended to replace advice given to you by your health care provider. Make sure you discuss any questions you have with your health care provider. ?Document Revised: 10/10/2020 Document Reviewed: 10/10/2020 ?Elsevier Patient Education ? [place]. ? ?

## 2021-08-09 NOTE — Assessment & Plan Note (Signed)
We discussed benefits of wt loss. ?Consistency with healthy diet and physical activity encouraged. ? ?

## 2021-08-09 NOTE — Assessment & Plan Note (Signed)
She is not on vit D supplementation. ?Further recommendations according to 25 OH vit D result. ?

## 2021-08-09 NOTE — Assessment & Plan Note (Signed)
Non pharmacologic treatment recommended for now. Further recommendations will be given according to 10 years CVD risk score and lipid panel numbers. 

## 2021-08-09 NOTE — Assessment & Plan Note (Signed)
She has decided to stop all medications and no longer following with psychiatrist. ?At this time she does not feel she needs to be on pharmacologic treatment. ?

## 2021-08-10 LAB — HEPATITIS C ANTIBODY
Hepatitis C Ab: NONREACTIVE
SIGNAL TO CUT-OFF: 0.02 (ref <1.00)

## 2021-09-06 ENCOUNTER — Ambulatory Visit
Admission: RE | Admit: 2021-09-06 | Discharge: 2021-09-06 | Disposition: A | Discharge status: Home / Self Care | Payer: Medicaid Other | Source: Ambulatory Visit | Attending: Family Medicine | Admitting: Family Medicine

## 2021-09-06 DIAGNOSIS — Z1231 Encounter for screening mammogram for malignant neoplasm of breast: Secondary | ICD-10-CM

## 2021-09-22 DIAGNOSIS — R1032 Left lower quadrant pain: Secondary | ICD-10-CM | POA: Diagnosis not present

## 2021-09-22 DIAGNOSIS — R110 Nausea: Secondary | ICD-10-CM | POA: Diagnosis not present

## 2021-09-22 DIAGNOSIS — G43919 Migraine, unspecified, intractable, without status migrainosus: Secondary | ICD-10-CM | POA: Diagnosis not present

## 2021-10-31 NOTE — Telephone Encounter (Signed)
Record opened in error.

## 2022-01-07 ENCOUNTER — Telehealth: Payer: Medicaid Other | Admitting: Nurse Practitioner

## 2022-01-07 DIAGNOSIS — J029 Acute pharyngitis, unspecified: Secondary | ICD-10-CM

## 2022-01-07 NOTE — Progress Notes (Signed)
  E-Visit for Sore Throat  We are sorry that you are not feeling well.  Here is how we plan to help!  Providers prescribe antibiotics to treat infections caused by bacteria. Antibiotics are very powerful in treating bacterial infections when they are used properly. To maintain their effectiveness, they should be used only when necessary. Overuse of antibiotics has resulted in the development of superbugs that are resistant to treatment!    After careful review of your answers, I would not recommend an antibiotic for your condition.  Antibiotics are not effective against viruses and therefore should not be used to treat them. Common examples of infections caused by viruses include colds and flu   Please feel free to reach out to [place] or Dr. Martinique in 7-10 days if symptoms persist.  Your symptoms indicate a likely viral infection (Pharyngitis).   Pharyngitis is inflammation in the back of the throat which can cause a sore throat, scratchiness and sometimes difficulty swallowing.   Pharyngitis is typically caused by a respiratory virus and will just run its course.  Please keep in mind that your symptoms could last up to 10 days.  For throat pain, we recommend over the counter oral pain relief medications such as acetaminophen or aspirin, or anti-inflammatory medications such as ibuprofen or naproxen sodium.  Topical treatments such as oral throat lozenges or sprays may be used as needed.  Avoid close contact with loved ones, especially the very young and elderly.  Remember to wash your hands thoroughly throughout the day as this is the number one way to prevent the spread of infection and wipe down door knobs and counters with disinfectant.  Home Care: Only take medications as instructed by your medical team. Do not drink alcohol while taking these medications. A steam or ultrasonic humidifier can help congestion.  You can place a towel over your head and breathe in the steam from hot water coming from a  faucet. Avoid close contacts especially the very young and the elderly. Cover your mouth when you cough or sneeze. Always remember to wash your hands.  Get Help Right Away If: You develop worsening fever or throat pain. You develop a severe head ache or visual changes. Your symptoms persist after you have completed your treatment plan.  Make sure you Understand these instructions. Will watch your condition. Will get help right away if you are not doing well or get worse.   Thank you for choosing an e-visit.  Your e-visit answers were reviewed by a board certified advanced clinical practitioner to complete your personal care plan. Depending upon the condition, your plan could have included both over the counter or prescription medications.  Please review your pharmacy choice. Make sure the pharmacy is open so you can pick up prescription now. If there is a problem, you may contact your provider through [employer] and have the prescription routed to another pharmacy.  Your safety is important to [place]. If you have drug allergies check your prescription carefully.   For the next 24 hours you can use MyChart to ask questions about today's visit, request a non-urgent call back, or ask for a work or school excuse. You will get an email in the next two days asking about your experience. I hope that your e-visit has been valuable and will speed your recovery.

## 2022-01-07 NOTE — Progress Notes (Signed)
I have spent 5 minutes in review of e-visit questionnaire, review and updating patient chart, medical decision making and response to patient.  ° °Ivelise Castillo W Makesha Belitz, NP ° °  °

## 2022-01-09 DIAGNOSIS — J020 Streptococcal pharyngitis: Secondary | ICD-10-CM | POA: Diagnosis not present

## 2022-01-17 ENCOUNTER — Telehealth: Payer: Medicaid Other | Admitting: Nurse Practitioner

## 2022-01-17 DIAGNOSIS — R1032 Left lower quadrant pain: Secondary | ICD-10-CM

## 2022-01-17 NOTE — Progress Notes (Signed)
Based on what you shared with me it looks like you have abdominal pain,that should be evaluated in a face to face office visit. You need to have lab work and possibly a ct scan or U/S of abdomen.  NOTE: There will be NO CHARGE for this eVisit   If you are having a true medical emergency please call 911.      For an urgent face to face visit, [place] has six urgent care centers for your convenience:     [facility] Get Driving Directions [phone] [address] [place], [place] [address]    [facility]) Get Driving Directions [phone] [address], [place] [address]  [facility] Get Driving Directions [phone] [address] [place],  [place]  [address]  [facility] Get Driving Directions [phone] [address], [place], [place] [address]   [facility] Get Driving Directions  [phone] [address] [place], [place] [address]   [facility] Get Driving Directions [phone] [address], [place],  [phone]  Your MyChart E-visit questionnaire answers were reviewed by a board certified advanced clinical practitioner to complete your personal care plan based on your specific symptoms.  Thank you for using e-Visits.

## 2022-03-19 ENCOUNTER — Telehealth: Payer: Medicaid Other | Admitting: Emergency Medicine

## 2022-03-19 DIAGNOSIS — J329 Chronic sinusitis, unspecified: Secondary | ICD-10-CM | POA: Diagnosis not present

## 2022-03-19 MED ORDER — IPRATROPIUM BROMIDE 0.03 % NA SOLN: 2.0000 | Freq: Two times a day (BID) | NASAL | 0 refills | Status: AC

## 2022-03-19 NOTE — Progress Notes (Signed)
E-Visit for Sinus Problems  We are sorry that you are not feeling well.  Here is how we plan to help!  Based on what you have shared with me it looks like you have sinusitis.  Sinusitis is inflammation and infection in the sinus cavities of the head.  Based on your presentation I believe you most likely have Acute Viral Sinusitis.This is an infection most likely caused by a virus. There is not specific treatment for viral sinusitis other than to help you with the symptoms until the infection runs its course.  You may use an oral decongestant such as Mucinex D or if you have glaucoma or high blood pressure use plain Mucinex. Saline nasal spray help and can safely be used as often as needed for congestion, I have prescribed: Ipratropium Bromide nasal spray 0.03% 2 sprays in eah nostril 2-3 times a day  Some authorities believe that zinc sprays or the use of Echinacea may shorten the course of your symptoms.  Sinus infections are not as easily transmitted as other respiratory infection, however we still recommend that you avoid close contact with loved ones, especially the very young and elderly.  Remember to wash your hands thoroughly throughout the day as this is the number one way to prevent the spread of infection!  Providers prescribe antibiotics to treat infections caused by bacteria. Antibiotics are very powerful in treating bacterial infections when they are used properly. To maintain their effectiveness, they should be used only when necessary. Overuse of antibiotics has resulted in the development of superbugs that are resistant to treatment!    After careful review of your answers, I would not recommend an antibiotic for your condition.  Antibiotics are not effective against viruses and therefore should not be used to treat them. Common examples of infections caused by viruses include colds and flu   Home Care: Only take medications as instructed by your medical team. Do not take these  medications with alcohol. A steam or ultrasonic humidifier can help congestion.  You can place a towel over your head and breathe in the steam from hot water coming from a faucet. Avoid close contacts especially the very young and the elderly. Cover your mouth when you cough or sneeze. Always remember to wash your hands.  Get Help Right Away If: You develop worsening fever or sinus pain. You develop a severe head ache or visual changes. Your symptoms persist after you have completed your treatment plan.  Make sure you Understand these instructions. Will watch your condition. Will get help right away if you are not doing well or get worse.   Thank you for choosing an e-visit.  Your e-visit answers were reviewed by a board certified advanced clinical practitioner to complete your personal care plan. Depending upon the condition, your plan could have included both over the counter or prescription medications.  Please review your pharmacy choice. Make sure the pharmacy is open so you can pick up prescription now. If there is a problem, you may contact your provider through [employer] and have the prescription routed to another pharmacy.  Your safety is important to [place]. If you have drug allergies check your prescription carefully.   For the next 24 hours you can use MyChart to ask questions about today's visit, request a non-urgent call back, or ask for a work or school excuse. You will get an email in the next two days asking about your experience. I hope that your e-visit has been valuable and will  speed your recovery.   Approximately 5 minutes was used in reviewing the patient's chart, questionnaire, prescribing medications, and documentation.

## 2022-04-16 NOTE — Progress Notes (Signed)
This encounter was created in error - please disregard.

## 2022-08-04 ENCOUNTER — Other Ambulatory Visit: Payer: Self-pay | Admitting: Family Medicine

## 2022-08-04 DIAGNOSIS — Z3041 Encounter for surveillance of contraceptive pills: Secondary | ICD-10-CM

## 2022-08-06 ENCOUNTER — Other Ambulatory Visit: Payer: Self-pay

## 2022-08-06 DIAGNOSIS — Z3041 Encounter for surveillance of contraceptive pills: Secondary | ICD-10-CM

## 2022-08-06 MED ORDER — NORGESTIMATE-ETH ESTRADIOL 0.25-35 MG-MCG PO TABS: 1.0000 | ORAL_TABLET | Freq: Every day | ORAL | 0 refills | Status: AC

## 2022-10-23 DIAGNOSIS — T22111A Burn of first degree of right forearm, initial encounter: Secondary | ICD-10-CM | POA: Diagnosis not present

## 2023-03-29 ENCOUNTER — Telehealth: Payer: Medicaid Other | Admitting: Physician Assistant

## 2023-03-29 DIAGNOSIS — R3989 Other symptoms and signs involving the genitourinary system: Secondary | ICD-10-CM

## 2023-03-29 MED ORDER — NITROFURANTOIN MONOHYD MACRO 100 MG PO CAPS: 100.0000 mg | ORAL_CAPSULE | Freq: Two times a day (BID) | ORAL | 0 refills | Status: DC

## 2023-03-29 NOTE — Progress Notes (Signed)

## 2023-06-05 DIAGNOSIS — M25512 Pain in left shoulder: Secondary | ICD-10-CM | POA: Diagnosis not present

## 2023-06-05 DIAGNOSIS — W19XXXA Unspecified fall, initial encounter: Secondary | ICD-10-CM | POA: Diagnosis not present

## 2023-06-05 DIAGNOSIS — M25522 Pain in left elbow: Secondary | ICD-10-CM | POA: Diagnosis not present

## 2023-06-21 ENCOUNTER — Ambulatory Visit (HOSPITAL_BASED_OUTPATIENT_CLINIC_OR_DEPARTMENT_OTHER): Payer: Medicaid Other

## 2023-06-21 ENCOUNTER — Ambulatory Visit (HOSPITAL_BASED_OUTPATIENT_CLINIC_OR_DEPARTMENT_OTHER): Payer: Medicaid Other | Admitting: Student

## 2023-06-21 DIAGNOSIS — M25512 Pain in left shoulder: Secondary | ICD-10-CM

## 2023-06-21 NOTE — Progress Notes (Signed)
Chief Complaint: Left shoulder injury     History of Present Illness:    [name] [name] is a 43 y.o. female presenting today for evaluation of a left shoulder injury.  Patient states that on New Year's Eve she slipped on her porch and fell onto her tailbone.  Immediately after she had pain in her tailbone, left elbow, and left shoulder.  Since the injury, her left shoulder pain has seemingly worsened while everything else is improved.  She was seen in urgent care and was given a sling which she wore for about a week.  Pain is located mainly in the anterior shoulder and is moderate to severe.  She has particular difficulty with shoulder abduction and does develop some tingling in the lateral shoulder when performing this motion.  Has been alternating ibuprofen and Tylenol as well using ice and heat.   Surgical History:   None  PMH/PSH/Family History/Social History/Meds/Allergies:    Past Medical History:  Diagnosis Date   Anxiety    Asthma    childhood exercise induced only   Cancer (HCC)    appendix   Cholecystitis chronic, acute    Depression    Gallstones    Headache(784.0)    migraines   Heart murmur    no indications for 7-41yrs per pt.   Past Surgical History:  Procedure Laterality Date   APPENDECTOMY     CESAREAN SECTION N/A 11/03/2012   Procedure: PRIMARY CESAREAN SECTION;  Surgeon: Bing Plume, MD;  Location: [facility];  Service: Obstetrics;  Laterality: N/A;   CHOLECYSTECTOMY N/A 12/23/2012   Procedure: LAPAROSCOPIC CHOLECYSTECTOMY WITH INTRAOPERATIVE CHOLANGIOGRAM;  Surgeon: Valarie Merino, MD;  Location: [facility];  Service: General;  Laterality: N/A;  Laparoscopic Cholecystectomy with IOC   COLON SURGERY     ERCP N/A 12/22/2012   Procedure: ENDOSCOPIC RETROGRADE CHOLANGIOPANCREATOGRAPHY (ERCP);  Surgeon: Iva Boop, MD;  Location: [facility];  Service: Endoscopy;  Laterality: N/A;   INSERTION OF MESH N/A 07/15/2017    Procedure: INSERTION OF MESH;  Surgeon: Griselda Miner, MD;  [facility];  Service: General;  Laterality: N/A;   LAPAROTOMY N/A 11/08/2012   Procedure: laparoscopic exploration and EXPLORATORY LAPAROTOMY;  Surgeon: Robyne Askew, MD;  Location: [facility];  Service: General;  Laterality: N/A;   VENTRAL HERNIA REPAIR N/A 07/15/2017   Procedure: LAPAROSCOPIC ASSISTED VENTRAL HERNIA REPAIR;  Surgeon: Griselda Miner, MD;  Location: [facility];  Service: General;  Laterality: N/A;   Social History   Socioeconomic History   Marital status: Single    Spouse name: Not on file   Number of children: 3   Years of education: Not on file   Highest education level: Associate degree: academic program  Occupational History   Occupation: [employer]  Tobacco Use   Smoking status: Never   Smokeless tobacco: Never  Vaping Use   Vaping status: Never Used  Substance and Sexual Activity   Alcohol use: Yes    Comment: occassional   Drug use: No   Sexual activity: Yes    Partners: Male    Birth control/protection: Pill  Other Topics Concern   Not on file  Social History Narrative   Single, lives with significant other Jomarie Longs)   #3 children: ages 13-11-2 1/2 months   Employer: [employer]  Independent ADLs      Patient is right-handed. She lives with her boy friend.   One level home    Caffeine - 20 oz of soda or tea a day   Social Drivers of Corporate investment banker Strain: Not on file  Food Insecurity: Not on file  Transportation Needs: Not on file  Physical Activity: Not on file  Stress: Not on file  Social Connections: Not on file   Family History  Problem Relation Age of Onset   Cancer Father        Leukemia   ADD / ADHD Other        family hx   Breast cancer Other        fhx   Mental illness Other        fhx   Heart disease Other        fhx   Hypertension Other        fhx   Heart defect Brother    Allergies  Allergen Reactions   Covid-19 (Mrna) Vaccine Swelling    Itchy,  swelling, hot to the touch   Current Outpatient Medications  Medication Sig Dispense Refill   clonazePAM (KLONOPIN) 0.5 MG tablet Take 1 tablet (0.5 mg total) by mouth daily as needed for anxiety. 30 tablet 2   ibuprofen (ADVIL,MOTRIN) 200 MG tablet Take 400 mg by mouth every 6 (six) hours as needed for headache or moderate pain.     ipratropium (ATROVENT) 0.03 % nasal spray Place 2 sprays into both nostrils every 12 (twelve) hours. 30 mL 0   magnesium oxide (MAG-OX) 400 (241.3 Mg) MG tablet Take 1 tablet (400 mg total) by mouth 2 (two) times daily. 4 tablet 0   nitrofurantoin, macrocrystal-monohydrate, (MACROBID) 100 MG capsule Take 1 capsule (100 mg total) by mouth 2 (two) times daily. 10 capsule 0   norgestimate-ethinyl estradiol (ESTARYLLA) 0.25-35 MG-MCG tablet Take 1 tablet by mouth daily. 84 tablet 0   SUMAtriptan (IMITREX) 100 MG tablet Take 1 tablet earliest onset of migraine.  May repeat in 2 hours if headache persists or recurs.  Maximum 2 tablets in 24hrs 10 tablet 3   No current facility-administered medications for this visit.   No results found.  Review of Systems:   A ROS was performed including pertinent positives and negatives as documented in the HPI.  Physical Exam :   Constitutional: NAD and appears stated age Neurological: Alert and oriented Psych: Appropriate affect and cooperative There were no vitals taken for this visit.   Comprehensive Musculoskeletal Exam:    Left shoulder exam demonstrates tenderness to palpation most significant over the anterior glenohumeral joint.  Active range of motion to 140 degrees forward elevation, 20 degrees external rotation, and internal rotation to L5.  All 3 heads of the deltoid fire with active and resisted abduction.  Sensation intact over the deltoid and distally throughout the left upper extremity.  Imaging:   Xray (left shoulder 3 views): Increased acromiohumeral interval.  No other evidence of acute bony  abnormality.   I personally reviewed and interpreted the radiographs.   Assessment:   43 y.o. female 2 weeks status post injury to her left shoulder without any significant improvement in her symptoms.  X-rays today do demonstrate an increased acromiohumeral interval which may be a result of a subluxation type injury and possible axillary nerve palsy.  She does have some weakness noted in the deltoid with abduction although she is able to perform active abduction against  light resistance.  No loss of sensation.  Discussed that I would like to proceed further with a stat MRI scan to evaluate for any residual damage to the labrum or rotator cuff.  Would like for her to continue with Tylenol and ibuprofen for pain control and can use the sling for comfort if needed.  Plan :    - Obtain stat MRI of the left shoulder and return for review and treatment discussion     I personally saw and evaluated the patient, and participated in the management and treatment plan.  Hazle Nordmann, PA-C Orthopedics

## 2023-06-29 ENCOUNTER — Ambulatory Visit
Admission: RE | Admit: 2023-06-29 | Discharge: 2023-06-29 | Disposition: A | Discharge status: Home / Self Care | Payer: Medicaid Other | Source: Ambulatory Visit | Attending: Orthopaedic Surgery

## 2023-06-29 ENCOUNTER — Encounter (HOSPITAL_BASED_OUTPATIENT_CLINIC_OR_DEPARTMENT_OTHER): Payer: Self-pay

## 2023-06-29 DIAGNOSIS — M25512 Pain in left shoulder: Secondary | ICD-10-CM | POA: Diagnosis not present

## 2023-06-29 DIAGNOSIS — M19012 Primary osteoarthritis, left shoulder: Secondary | ICD-10-CM | POA: Diagnosis not present

## 2023-06-29 DIAGNOSIS — G8929 Other chronic pain: Secondary | ICD-10-CM | POA: Diagnosis not present

## 2023-06-29 DIAGNOSIS — M7552 Bursitis of left shoulder: Secondary | ICD-10-CM | POA: Diagnosis not present

## 2023-07-04 ENCOUNTER — Ambulatory Visit (HOSPITAL_BASED_OUTPATIENT_CLINIC_OR_DEPARTMENT_OTHER): Payer: Medicaid Other | Admitting: Student

## 2023-07-04 DIAGNOSIS — M25512 Pain in left shoulder: Secondary | ICD-10-CM

## 2023-07-04 NOTE — Progress Notes (Signed)
Chief Complaint: Left shoulder injury     History of Present Illness:   07/04/23: Patient presents today for follow-up of her left shoulder and MRI review.  Overall she reports mild improvement however does still continue to have moderate pain in the shoulder.  She has been using the sling while at work but has been able to discontinue it at home.  Has noticed slight improvements with range of motion.   [name] [name] is a 43 y.o. female presenting today for evaluation of a left shoulder injury.  Patient states that on New Year's Eve she slipped on her porch and fell onto her tailbone.  Immediately after she had pain in her tailbone, left elbow, and left shoulder.  Since the injury, her left shoulder pain has seemingly worsened while everything else is improved.  She was seen in urgent care and was given a sling which she wore for about a week.  Pain is located mainly in the anterior shoulder and is moderate to severe.  She has particular difficulty with shoulder abduction and does develop some tingling in the lateral shoulder when performing this motion.  Has been alternating ibuprofen and Tylenol as well using ice and heat.   Surgical History:   None  PMH/PSH/Family History/Social History/Meds/Allergies:    Past Medical History:  Diagnosis Date   Anxiety    Asthma    childhood exercise induced only   Cancer (HCC)    appendix   Cholecystitis chronic, acute    Depression    Gallstones    Headache(784.0)    migraines   Heart murmur    no indications for 7-45yrs per pt.   Past Surgical History:  Procedure Laterality Date   APPENDECTOMY     CESAREAN SECTION N/A 11/03/2012   Procedure: PRIMARY CESAREAN SECTION;  Surgeon: Bing Plume, MD;  Location: [facility];  Service: Obstetrics;  Laterality: N/A;   CHOLECYSTECTOMY N/A 12/23/2012   Procedure: LAPAROSCOPIC CHOLECYSTECTOMY WITH INTRAOPERATIVE CHOLANGIOGRAM;  Surgeon: Valarie Merino, MD;  Location:  [facility];  Service: General;  Laterality: N/A;  Laparoscopic Cholecystectomy with IOC   COLON SURGERY     ERCP N/A 12/22/2012   Procedure: ENDOSCOPIC RETROGRADE CHOLANGIOPANCREATOGRAPHY (ERCP);  Surgeon: Iva Boop, MD;  Location: [facility];  Service: Endoscopy;  Laterality: N/A;   INSERTION OF MESH N/A 07/15/2017   Procedure: INSERTION OF MESH;  Surgeon: Griselda Miner, MD;  Location: [facility];  Service: General;  Laterality: N/A;   LAPAROTOMY N/A 11/08/2012   Procedure: laparoscopic exploration and EXPLORATORY LAPAROTOMY;  Surgeon: Robyne Askew, MD;  Location: [facility];  Service: General;  Laterality: N/A;   VENTRAL HERNIA REPAIR N/A 07/15/2017   Procedure: LAPAROSCOPIC ASSISTED VENTRAL HERNIA REPAIR;  Surgeon: Griselda Miner, MD;  Location: [facility];  Service: General;  Laterality: N/A;   Social History   Socioeconomic History   Marital status: Single    Spouse name: Not on file   Number of children: 3   Years of education: Not on file   Highest education level: Associate degree: academic program  Occupational History   Occupation: [employer]  Tobacco Use   Smoking status: Never   Smokeless tobacco: Never  Vaping Use   Vaping status: Never Used  Substance and Sexual Activity   Alcohol use: Yes  Comment: occassional   Drug use: No   Sexual activity: Yes    Partners: Male    Birth control/protection: Pill  Other Topics Concern   Not on file  Social History Narrative   Single, lives with significant other Jomarie Longs)   #3 children: ages 13-11-2 1/2 months   Employer: [employer]   Independent ADLs      Patient is right-handed. She lives with her boy friend.   One level home    Caffeine - 20 oz of soda or tea a day   Social Drivers of Corporate investment banker Strain: Not on file  Food Insecurity: Not on file  Transportation Needs: Not on file  Physical Activity: Not on file  Stress: Not on file  Social Connections: Not on file   Family History  Problem Relation Age of Onset    Cancer Father        Leukemia   ADD / ADHD Other        family hx   Breast cancer Other        fhx   Mental illness Other        fhx   Heart disease Other        fhx   Hypertension Other        fhx   Heart defect Brother    Allergies  Allergen Reactions   Covid-19 (Mrna) Vaccine Swelling    Itchy, swelling, hot to the touch   Current Outpatient Medications  Medication Sig Dispense Refill   clonazePAM (KLONOPIN) 0.5 MG tablet Take 1 tablet (0.5 mg total) by mouth daily as needed for anxiety. 30 tablet 2   ibuprofen (ADVIL,MOTRIN) 200 MG tablet Take 400 mg by mouth every 6 (six) hours as needed for headache or moderate pain.     ipratropium (ATROVENT) 0.03 % nasal spray Place 2 sprays into both nostrils every 12 (twelve) hours. 30 mL 0   magnesium oxide (MAG-OX) 400 (241.3 Mg) MG tablet Take 1 tablet (400 mg total) by mouth 2 (two) times daily. 4 tablet 0   nitrofurantoin, macrocrystal-monohydrate, (MACROBID) 100 MG capsule Take 1 capsule (100 mg total) by mouth 2 (two) times daily. 10 capsule 0   norgestimate-ethinyl estradiol (ESTARYLLA) 0.25-35 MG-MCG tablet Take 1 tablet by mouth daily. 84 tablet 0   SUMAtriptan (IMITREX) 100 MG tablet Take 1 tablet earliest onset of migraine.  May repeat in 2 hours if headache persists or recurs.  Maximum 2 tablets in 24hrs 10 tablet 3   No current facility-administered medications for this visit.   No results found.  Review of Systems:   A ROS was performed including pertinent positives and negatives as documented in the HPI.  Physical Exam :   Constitutional: NAD and appears stated age Neurological: Alert and oriented Psych: Appropriate affect and cooperative There were no vitals taken for this visit.   Comprehensive Musculoskeletal Exam:    Active range of motion of the left shoulder to 160 degrees flexion, 20 degrees external rotation, and internal rotation L5.  Positive jerk maneuver.  Negative empty can and O'Brien test.   Tenderness over the anterior glenohumeral joint lateral deltoid.  Imaging:   MRI left shoulder: No significant rotator cuff tear is visualized.  Posterior subluxation of the humerus in relation to the glenoid.   I personally reviewed and interpreted the radiographs.   Assessment:   43 y.o. female with acute left shoulder pain due to a backwards fall.  Her range of motion has  improved slightly however her shoulder continues to be painful.  MRI does suggest posterior positioning of the humerus suggesting posterior shoulder instability.  This is consistent with her exam today.  I have offered a glenohumeral injection for symptom relief which she would like to proceed with.  Injection was performed in clinic and she tolerated this well.  I would like her to return in 3 weeks for reexamination and to discuss further treatment plan.  Plan :    -Left shoulder glenohumeral cortisone injection performed today -Follow-up in 3 weeks for reassessment     Procedure Note  Patient: Cherica Heiden             Date of Birth: [birth year]           MRN: [id number]             Visit Date: 07/04/2023  Procedures: Visit Diagnoses:  1. Acute pain of left shoulder     Large Joint Inj: L glenohumeral on 07/04/2023 5:29 PM Indications: pain Details: 22 G 1.5 in needle, ultrasound-guided anterior approach Medications: 4 mL lidocaine 1 %; 2 mL triamcinolone acetonide 40 MG/ML Outcome: tolerated well, no immediate complications Procedure, treatment alternatives, risks and benefits explained, specific risks discussed. Consent was given by the patient. Immediately prior to procedure a time out was called to verify the correct patient, procedure, equipment, support staff and site/side marked as required. Patient was prepped and draped in the usual sterile fashion.       I personally saw and evaluated the patient, and participated in the management and treatment plan.  Hazle Nordmann, PA-C Orthopedics

## 2023-07-05 ENCOUNTER — Telehealth: Payer: Medicaid Other | Admitting: Physician Assistant

## 2023-07-05 DIAGNOSIS — H6501 Acute serous otitis media, right ear: Secondary | ICD-10-CM

## 2023-07-05 MED ORDER — CIPROFLOXACIN-DEXAMETHASONE 0.3-0.1 % OT SUSP: 4.0000 [drp] | Freq: Two times a day (BID) | OTIC | 0 refills | Status: DC

## 2023-07-05 MED ORDER — TRIAMCINOLONE ACETONIDE 40 MG/ML IJ SUSP
2.0000 mL | INTRAMUSCULAR | Status: AC | PRN
  Administered 2023-07-04: 2 mL via INTRA_ARTICULAR

## 2023-07-05 MED ORDER — LIDOCAINE HCL 1 % IJ SOLN
4.0000 mL | INTRAMUSCULAR | Status: AC | PRN
  Administered 2023-07-04: 4 mL

## 2023-07-05 MED ORDER — AMOXICILLIN-POT CLAVULANATE 875-125 MG PO TABS: 1.0000 | ORAL_TABLET | Freq: Two times a day (BID) | ORAL | 0 refills | Status: DC

## 2023-07-05 NOTE — Progress Notes (Signed)

## 2023-07-12 ENCOUNTER — Telehealth: Payer: Medicaid Other | Admitting: Family Medicine

## 2023-07-12 DIAGNOSIS — H9209 Otalgia, unspecified ear: Secondary | ICD-10-CM

## 2023-07-12 NOTE — Progress Notes (Signed)
  Because Ms. Mckee, I feel your condition warrants further evaluation and I recommend that you be seen in a face-to-face visit.   NOTE: There will be NO CHARGE for this E-Visit   If you are having a true medical emergency, please call 911.     For an urgent face to face visit, [place] has multiple urgent care centers for your convenience.  Click the link below for the full list of locations and hours, walk-in wait times, appointment scheduling options and driving directions:  Urgent Care - [place], [place], [place], [place], [place], [place]       Your MyChart E-visit questionnaire answers were reviewed by a board certified advanced clinical practitioner to complete your personal care plan based on your specific symptoms.    Thank you for using e-Visits.

## 2023-07-17 DIAGNOSIS — H6501 Acute serous otitis media, right ear: Secondary | ICD-10-CM | POA: Diagnosis not present

## 2023-07-25 ENCOUNTER — Ambulatory Visit (HOSPITAL_BASED_OUTPATIENT_CLINIC_OR_DEPARTMENT_OTHER): Payer: Medicaid Other | Admitting: Orthopaedic Surgery

## 2023-07-29 ENCOUNTER — Encounter (HOSPITAL_BASED_OUTPATIENT_CLINIC_OR_DEPARTMENT_OTHER): Payer: Self-pay | Admitting: Student

## 2023-07-29 ENCOUNTER — Ambulatory Visit (HOSPITAL_BASED_OUTPATIENT_CLINIC_OR_DEPARTMENT_OTHER): Payer: Medicaid Other | Admitting: Student

## 2023-07-29 DIAGNOSIS — M25512 Pain in left shoulder: Secondary | ICD-10-CM | POA: Diagnosis not present

## 2023-07-29 NOTE — Progress Notes (Signed)
 Chief Complaint: Left shoulder injury     History of Present Illness:   07/29/23: Patient presents today for follow-up of her left shoulder.  Overall she states that she has been doing extremely well and injection performed at last visit has given her significant relief.  She does have full range of motion although notes having some weakness while lifting at work.  Does not have any pain but does report some soreness after lifting.   06/21/23: [name] [name] is a 43 y.o. female presenting today for evaluation of a left shoulder injury.  Patient states that on New Year's Eve she slipped on her porch and fell onto her tailbone.  Immediately after she had pain in her tailbone, left elbow, and left shoulder.  Since the injury, her left shoulder pain has seemingly worsened while everything else is improved.  She was seen in urgent care and was given a sling which she wore for about a week.  Pain is located mainly in the anterior shoulder and is moderate to severe.  She has particular difficulty with shoulder abduction and does develop some tingling in the lateral shoulder when performing this motion.  Has been alternating ibuprofen and Tylenol as well using ice and heat.  Surgical History:   None  PMH/PSH/Family History/Social History/Meds/Allergies:    Past Medical History:  Diagnosis Date   Anxiety    Asthma    childhood exercise induced only   Cancer (HCC)    appendix   Cholecystitis chronic, acute    Depression    Gallstones    Headache(784.0)    migraines   Heart murmur    no indications for 7-54yrs per pt.   Past Surgical History:  Procedure Laterality Date   APPENDECTOMY     CESAREAN SECTION N/A 11/03/2012   Procedure: PRIMARY CESAREAN SECTION;  Surgeon: Bing Plume, MD;  Location: [facility];  Service: Obstetrics;  Laterality: N/A;   CHOLECYSTECTOMY N/A 12/23/2012   Procedure: LAPAROSCOPIC CHOLECYSTECTOMY WITH INTRAOPERATIVE CHOLANGIOGRAM;   Surgeon: Valarie Merino, MD;  Location: [facility];  Service: General;  Laterality: N/A;  Laparoscopic Cholecystectomy with IOC   COLON SURGERY     ERCP N/A 12/22/2012   Procedure: ENDOSCOPIC RETROGRADE CHOLANGIOPANCREATOGRAPHY (ERCP);  Surgeon: Iva Boop, MD;  Location: [facility];  Service: Endoscopy;  Laterality: N/A;   INSERTION OF MESH N/A 07/15/2017   Procedure: INSERTION OF MESH;  Surgeon: Griselda Miner, MD;  Location: [facility];  Service: General;  Laterality: N/A;   LAPAROTOMY N/A 11/08/2012   Procedure: laparoscopic exploration and EXPLORATORY LAPAROTOMY;  Surgeon: Robyne Askew, MD;  Location: [facility];  Service: General;  Laterality: N/A;   VENTRAL HERNIA REPAIR N/A 07/15/2017   Procedure: LAPAROSCOPIC ASSISTED VENTRAL HERNIA REPAIR;  Surgeon: Griselda Miner, MD;  Location: [facility];  Service: General;  Laterality: N/A;   Social History   Socioeconomic History   Marital status: Single    Spouse name: Not on file   Number of children: 3   Years of education: Not on file   Highest education level: Associate degree: academic program  Occupational History   Occupation: [employer]  Tobacco Use   Smoking status: Never   Smokeless tobacco: Never  Vaping Use   Vaping status: Never Used  Substance and Sexual Activity   Alcohol use:  Yes    Comment: occassional   Drug use: No   Sexual activity: Yes    Partners: Male    Birth control/protection: Pill  Other Topics Concern   Not on file  Social History Narrative   Single, lives with significant other Jomarie Longs)   #3 children: ages 13-11-2 1/2 months   Employer: [employer]   Independent ADLs      Patient is right-handed. She lives with her boy friend.   One level home    Caffeine - 20 oz of soda or tea a day   Social Drivers of Corporate investment banker Strain: Not on file  Food Insecurity: Not on file  Transportation Needs: Not on file  Physical Activity: Not on file  Stress: Not on file  Social Connections: Not on file    Family History  Problem Relation Age of Onset   Cancer Father        Leukemia   ADD / ADHD Other        family hx   Breast cancer Other        fhx   Mental illness Other        fhx   Heart disease Other        fhx   Hypertension Other        fhx   Heart defect Brother    Allergies  Allergen Reactions   Covid-19 (Mrna) Vaccine Swelling    Itchy, swelling, hot to the touch   Current Outpatient Medications  Medication Sig Dispense Refill   amoxicillin-clavulanate (AUGMENTIN) 875-125 MG tablet Take 1 tablet by mouth 2 (two) times daily. 14 tablet 0   ciprofloxacin-dexamethasone (CIPRODEX) OTIC suspension Place 4 drops into the right ear 2 (two) times daily. For 7 days 7.5 mL 0   clonazePAM (KLONOPIN) 0.5 MG tablet Take 1 tablet (0.5 mg total) by mouth daily as needed for anxiety. 30 tablet 2   ibuprofen (ADVIL,MOTRIN) 200 MG tablet Take 400 mg by mouth every 6 (six) hours as needed for headache or moderate pain.     ipratropium (ATROVENT) 0.03 % nasal spray Place 2 sprays into both nostrils every 12 (twelve) hours. 30 mL 0   magnesium oxide (MAG-OX) 400 (241.3 Mg) MG tablet Take 1 tablet (400 mg total) by mouth 2 (two) times daily. 4 tablet 0   nitrofurantoin, macrocrystal-monohydrate, (MACROBID) 100 MG capsule Take 1 capsule (100 mg total) by mouth 2 (two) times daily. 10 capsule 0   norgestimate-ethinyl estradiol (ESTARYLLA) 0.25-35 MG-MCG tablet Take 1 tablet by mouth daily. 84 tablet 0   SUMAtriptan (IMITREX) 100 MG tablet Take 1 tablet earliest onset of migraine.  May repeat in 2 hours if headache persists or recurs.  Maximum 2 tablets in 24hrs 10 tablet 3   No current facility-administered medications for this visit.   No results found.  Review of Systems:   A ROS was performed including pertinent positives and negatives as documented in the HPI.  Physical Exam :   Constitutional: NAD and appears stated age Neurological: Alert and oriented Psych: Appropriate affect  and cooperative There were no vitals taken for this visit.   Comprehensive Musculoskeletal Exam:    No pinpoint tenderness with palpation of the left shoulder.  Active range of motion to 160 degrees forward flexion, 40 degrees external rotation, and internal rotation L5.  Negative empty can, O'Brien, and impingement tests.  Imaging:     Assessment:   43 y.o. female with an acute shoulder injury  that occurred due to a fall on New Year's Eve.  Previous MRI and testing suggested presence of some posterior shoulder instability.  A glenohumeral cortisone injection was performed at last visit 3 weeks ago and has given her almost complete relief.  She is now having no pain and demonstrates full range of motion.  She does have some mild weakness although no other signs of instability.  Recommend continuing to use the shoulder for normal activities discussed that should she have some persistent weakness could consider referral to physical therapy for strengthening.  Will plan to see her back as needed.  Plan :    -Return to clinic as needed -Consider referral to physical therapy for strengthening if weakness persists     I personally saw and evaluated the patient, and participated in the management and treatment plan.  Hazle Nordmann, PA-C Orthopedics

## 2024-03-09 DIAGNOSIS — L03213 Periorbital cellulitis: Secondary | ICD-10-CM | POA: Diagnosis not present

## 2024-04-06 ENCOUNTER — Encounter: Payer: Self-pay | Admitting: Radiology

## 2024-04-28 DIAGNOSIS — M25562 Pain in left knee: Secondary | ICD-10-CM | POA: Diagnosis not present

## 2024-06-02 ENCOUNTER — Telehealth: Admitting: Physician Assistant

## 2024-06-02 DIAGNOSIS — B9689 Other specified bacterial agents as the cause of diseases classified elsewhere: Secondary | ICD-10-CM | POA: Diagnosis not present

## 2024-06-02 DIAGNOSIS — J019 Acute sinusitis, unspecified: Secondary | ICD-10-CM | POA: Diagnosis not present

## 2024-06-02 MED ORDER — AMOXICILLIN-POT CLAVULANATE 875-125 MG PO TABS: 1.0000 | ORAL_TABLET | Freq: Two times a day (BID) | ORAL | 0 refills | Status: AC

## 2024-06-02 NOTE — Progress Notes (Signed)
